# Patient Record
Sex: Female | Born: 1960 | Race: Black or African American | Hispanic: No | Smoking: Current every day smoker
Health system: Southern US, Community
[De-identification: ages and names within clinical notes are randomized; demographics above are authoritative.]

## PROBLEM LIST (undated history)

## (undated) DIAGNOSIS — K219 Gastro-esophageal reflux disease without esophagitis: Secondary | ICD-10-CM

## (undated) DIAGNOSIS — T8859XA Other complications of anesthesia, initial encounter: Secondary | ICD-10-CM

## (undated) DIAGNOSIS — M549 Dorsalgia, unspecified: Secondary | ICD-10-CM

## (undated) DIAGNOSIS — I10 Essential (primary) hypertension: Secondary | ICD-10-CM

## (undated) HISTORY — DX: Dorsalgia, unspecified: M54.9

## (undated) HISTORY — PX: KNEE SURGERY: SHX244

## (undated) HISTORY — DX: Essential (primary) hypertension: I10

---

## 2005-01-22 ENCOUNTER — Emergency Department: Payer: Self-pay | Admitting: Unknown Physician Specialty

## 2008-04-01 ENCOUNTER — Emergency Department: Payer: Self-pay | Admitting: Emergency Medicine

## 2008-06-01 ENCOUNTER — Ambulatory Visit: Payer: Self-pay

## 2009-11-29 ENCOUNTER — Ambulatory Visit: Payer: Self-pay | Admitting: Obstetrics and Gynecology

## 2009-12-01 ENCOUNTER — Ambulatory Visit: Payer: Self-pay | Admitting: Obstetrics and Gynecology

## 2010-09-04 ENCOUNTER — Emergency Department: Payer: Self-pay | Admitting: Emergency Medicine

## 2012-02-09 ENCOUNTER — Emergency Department: Payer: Self-pay | Admitting: Emergency Medicine

## 2017-05-22 ENCOUNTER — Encounter: Payer: Self-pay | Admitting: Nurse Practitioner

## 2017-05-22 ENCOUNTER — Ambulatory Visit (INDEPENDENT_AMBULATORY_CARE_PROVIDER_SITE_OTHER): Payer: 59 | Admitting: Nurse Practitioner

## 2017-05-22 VITALS — BP 146/85 | HR 71 | Temp 97.7°F | Ht 71.0 in | Wt 180.4 lb

## 2017-05-22 DIAGNOSIS — R634 Abnormal weight loss: Secondary | ICD-10-CM

## 2017-05-22 DIAGNOSIS — Z7689 Persons encountering health services in other specified circumstances: Secondary | ICD-10-CM | POA: Diagnosis not present

## 2017-05-22 DIAGNOSIS — Z1211 Encounter for screening for malignant neoplasm of colon: Secondary | ICD-10-CM

## 2017-05-22 DIAGNOSIS — I872 Venous insufficiency (chronic) (peripheral): Secondary | ICD-10-CM | POA: Diagnosis not present

## 2017-05-22 NOTE — Progress Notes (Signed)
Subjective:    Patient ID: Amber Figueroa, female    DOB: [birth year], 56 y.o.   MRN: [id number]  Donnelle Mcbroom is a 56 y.o. female presenting on 05/22/2017 for Establish Care (pt notice moderate weight loss without any changes. She was 30lbs heavier about year ago. )   HPI Establish Care New Provider Pt last seen by PCP 15 or more years ago.     Weight Loss 30 lbs in 1 year - unintentional. - Pt also started smoking again 1 year ago. Started as stress management. - Switched to night shift 3 weeks ago - no difficulty w/ sleep currently.  Works from 10-12 hours shifts, 60 hours per week.  Weight 210 lbs 1 year ago. Started losing weight when she started smoking.  Eating patterns haven't changed.   - Eats out frequently: eats [employer], [place], fast foods.  Eats full portion.   Chronic Back Pain Low back takes ibuprofen 400 mg twice daily for pain with adequate relief.   Lower leg swelling Started at "56 years old" - improves after sleep.  Has used compression socks in past.  Others non-prescription don't work well.   Has pain in legs and numbness in toes. Always pitting edema.  Past Medical History:  Diagnosis Date  . Back pain    Past Surgical History:  Procedure Laterality Date  . KNEE SURGERY     Social History   Social History  . Marital status: Married    Spouse name: N/A  . Number of children: N/A  . Years of education: N/A   Occupational History  . Not on file.   Social History Main Topics  . Smoking status: Current Every Day Smoker    Packs/day: 0.50    Years: 1.00  . Smokeless tobacco: Never Used     Comment: age [age] smoked 1/2 ppd, quit x 20 years, now smoking 1 year  . Alcohol use No  . Drug use: No  . Sexual activity: No   Other Topics Concern  . Not on file   Social History Narrative  . No narrative on file   Family History  Problem Relation Age of Onset  . Cancer Mother   . Cancer Father        unknown type  . Hypertension Sister   .  Hypertension Brother   . Hyperlipidemia Brother   . Breast cancer Maternal Grandmother        likely breast ca - mastectomy  . Hypertension Brother   . Diabetes Brother   . Hypertension Brother   . Heart attack Neg Hx   . Stroke Neg Hx   . Colon cancer Neg Hx   . Ovarian cancer Neg Hx    No current outpatient prescriptions on file prior to visit.   No current facility-administered medications on file prior to visit.     Review of Systems  Constitutional: Positive for unexpected weight change.  Cardiovascular: Positive for leg swelling.  All other systems reviewed and are negative.  Per HPI unless specifically indicated above     Objective:    BP (!) 146/85 (BP Location: Right Arm, Patient Position: Sitting, Cuff Size: Normal)   Pulse 71   Temp 97.7 F (36.5 C) (Oral)   Ht 5\' 11"  (1.803 m)   Wt 180 lb 6.4 oz (81.8 kg)   LMP 05/22/2009   SpO2 100%   BMI 25.16 kg/m   Wt Readings from Last 3 Encounters:  05/22/17 180 lb 6.4 oz (81.8 kg)  Physical Exam   General - healthy, well-appearing, NAD HEENT - Normocephalic, atraumatic, PERRL, EOMI, patent nares w/o congestion, oropharynx clear, MMM Neck - supple, non-tender, no LAD, no thyromegaly Heart - RRR, no murmurs heard Lungs - Clear throughout all lobes, no wheezing, crackles, or rhonchi. Normal work of breathing. Abdomen - soft, NTND, no masses, no hepatosplenomegaly, active bowel sounds Extremeties - non-tender, +4 pitting edema bilaterally w/ significant ankle sock-line edema, cap refill < 2 seconds, peripheral pulses intact +2 bilaterally Skin - warm, dry, no rashes Neuro - awake, alert, oriented x3, normal gait Psych - Normal mood and affect, normal behavior    Results for orders placed or performed in visit on 05/22/17  TSH  Result Value Ref Range   TSH 1.76 mIU/L      Assessment & Plan:   Problem List Items Addressed This Visit      Cardiovascular and Mediastinum   Venous insufficiency    Severe,  chronic. Possible lymphedema as well.  Pt has worn compression socks, but only has success w/ strongest pressure.  Plan: 1. Encourage compression socks. - Order written for 40-50 mmHg 2. Consider vein and vascular referral. 3. Follow up as needed.      Relevant Orders   Compression stockings    Other Visit Diagnoses    Weight loss    -  Primary Pt notes weight loss of about 30 lbs.  Multifactorial, but w/ questioning occurred after resuming smoking in last 1 year.  Possible association to edema/fluid, increased nicotine, decreased food consumption.  Cannot exclude cancer, but unlikely. Pt due for routine cancer screenings.  Plan: 1. Encouraged continued adequate nutrition. 2. Recommend cancer screenings for colon cancer, cervical cancer. 3. Evaluate TSH - normal and not cause for weight changes.   Relevant Orders   TSH (Completed)   Colon cancer screening     Pt requiring colon cancer screening.  No family history of colon cancer.  Plan: - Discussed timing for initiation of colon cancer screening ACS vs USPSTF guidelines - Mutual decision making discussion for options of colonoscopy vs cologuard.  Pt prefers colonoscopy.  - Referral to GI placed.   Relevant Orders   Ambulatory referral to Gastroenterology   Encounter to establish care     Previous PCP was more than 15 years ago.  Pt's personal past medical, family, and surgical history reviewed.  No medical records available to be obtained.         Follow up plan: Return in about 3 weeks (around 06/12/2017) for annual physical.  Wilhelmina Mcardle, DNP, AGPCNP-BC Adult Gerontology Primary Care Nurse Practitioner [facility] 05/27/2017, 9:38 AM

## 2017-05-22 NOTE — Patient Instructions (Addendum)
Amber Figueroa, Thank you for coming in to clinic today.  1. For your physical: - You will be due for FASTING BLOOD WORK (no food or drink after midnight before, only water or coffee without cream/sugar on the morning of)  - Please go ahead and schedule a "Lab Only" visit in the morning at the clinic for lab draw 2-3 days before your next Annual Physical  For Lab Results, once available within 2-3 days of blood draw, you can can log in to MyChart online to view your results and a brief explanation. Also, we can discuss results at next follow-up visit.   2. For your leg swelling:  - Start wearing compression socks.  I have given you an order for 3 pairs.   3. You will get a call from [facility] for your colonoscopy for colon cancer screening.    4. For your weight loss: - Could be only because nicotine is a stimulant and that can make you lose weight. - I am checking your thyroid today also.  If it is overactive, you could lose weight. - We need to do your cancer screenings.  Please schedule a follow-up appointment with Wilhelmina Mcardle, AGNP. Return in about 3 weeks (around 06/12/2017) for annual physical.  If you have any other questions or concerns, please feel free to call the clinic or send a message through MyChart. You may also schedule an earlier appointment if necessary.  You will receive a survey after today's visit either digitally by e-mail or paper by [employer]. Your experiences and feedback matter to [place].  Please respond so we know how we are doing as we provide care for you.   Wilhelmina Mcardle, DNP, AGNP-BC Adult Gerontology Nurse Practitioner [facility], [facility]

## 2017-05-23 LAB — TSH: TSH: 1.76 mIU/L

## 2017-05-27 DIAGNOSIS — I872 Venous insufficiency (chronic) (peripheral): Secondary | ICD-10-CM | POA: Insufficient documentation

## 2017-05-27 NOTE — Progress Notes (Signed)
I have reviewed this encounter including the documentation in this note and/or discussed this patient with the provider, Wilhelmina Mcardle, AGPCNP-BC. I am certifying that I agree with the content of this note as supervising physician.  Saralyn Pilar, DO [facility] 05/27/2017, 10:59 AM

## 2017-05-27 NOTE — Assessment & Plan Note (Signed)
Severe, chronic. Possible lymphedema as well.  Pt has worn compression socks, but only has success w/ strongest pressure.  Plan: 1. Encourage compression socks. - Order written for 40-50 mmHg 2. Consider vein and vascular referral. 3. Follow up as needed.

## 2017-06-02 ENCOUNTER — Emergency Department
Admission: EM | Admit: 2017-06-02 | Discharge: 2017-06-02 | Disposition: A | Discharge status: Home / Self Care | Payer: 59 | Attending: Emergency Medicine | Admitting: Emergency Medicine

## 2017-06-02 ENCOUNTER — Emergency Department: Payer: 59

## 2017-06-02 ENCOUNTER — Encounter: Payer: Self-pay | Admitting: Emergency Medicine

## 2017-06-02 DIAGNOSIS — F17210 Nicotine dependence, cigarettes, uncomplicated: Secondary | ICD-10-CM | POA: Diagnosis not present

## 2017-06-02 DIAGNOSIS — M1712 Unilateral primary osteoarthritis, left knee: Secondary | ICD-10-CM | POA: Diagnosis not present

## 2017-06-02 DIAGNOSIS — M25562 Pain in left knee: Secondary | ICD-10-CM | POA: Diagnosis present

## 2017-06-02 MED ORDER — MELOXICAM 15 MG PO TABS: 15.0000 mg | ORAL_TABLET | Freq: Every day | ORAL | 0 refills | Status: DC

## 2017-06-02 NOTE — ED Provider Notes (Signed)
[facility] Emergency Department Provider Note   ____________________________________________   First MD Initiated Contact with Patient 06/02/17 1533     (approximate)  I have reviewed the triage vital signs and the nursing notes.   HISTORY  Chief Complaint Knee Pain    HPI Amber Figueroa is a 56 y.o. female atient complaining of 1 week of knee pain secondary to hyperextension incident occurred at work. Patient states she stepped off a pallethyperextended her left knee. Patient stated pain increases with flexion of the knee. Patient states she is able to weight-bear. Patient rates the pain as 8/10. Patient described a pain as "throbbing/sharp". Mild relief over-the-counter anti-inflammatory medications.   Past Medical History:  Diagnosis Date  . Back pain     Patient Active Problem List   Diagnosis Date Noted  . Venous insufficiency 05/27/2017    Past Surgical History:  Procedure Laterality Date  . KNEE SURGERY      Prior to Admission medications   Medication Sig Start Date End Date Taking? Authorizing Provider  meloxicam (MOBIC) 15 MG tablet Take 1 tablet (15 mg total) by mouth daily. 06/02/17   Joni ReiningSmith, Ronald K, PA-C    Allergies Patient has no known allergies.  Family History  Problem Relation Age of Onset  . Cancer Mother   . Cancer Father        unknown type  . Hypertension Sister   . Hypertension Brother   . Hyperlipidemia Brother   . Breast cancer Maternal Grandmother        likely breast ca - mastectomy  . Hypertension Brother   . Diabetes Brother   . Hypertension Brother   . Heart attack Neg Hx   . Stroke Neg Hx   . Colon cancer Neg Hx   . Ovarian cancer Neg Hx     Social History Social History  Substance Use Topics  . Smoking status: Current Every Day Smoker    Packs/day: 0.50    Years: 1.00  . Smokeless tobacco: Never Used     Comment: age [age] smoked 1/2 ppd, quit x 20 years, now smoking 1 year  . Alcohol use  No    Review of Systems  Constitutional: No fever/chills Eyes: No visual changes. ENT: No sore throat. Cardiovascular: Denies chest pain. Respiratory: Denies shortness of breath. Gastrointestinal: No abdominal pain.  No nausea, no vomiting.  No diarrhea.  No constipation. Genitourinary: Negative for dysuria. Musculoskeletal: Negative for back pain. Skin: Negative for rash. Neurological: Negative for headaches, focal weakness or numbness.   ____________________________________________   PHYSICAL EXAM:  VITAL SIGNS: ED Triage Vitals  Enc Vitals Group     BP 06/02/17 1350 (!) 155/91     Pulse Rate 06/02/17 1350 85     Resp 06/02/17 1350 16     Temp 06/02/17 1350 98.6 F (37 C)     Temp Source 06/02/17 1350 Oral     SpO2 06/02/17 1350 96 %     Weight 06/02/17 1345 106 lb (48.1 kg)     Height 06/02/17 1345 5\' 11"  (1.803 m)     Head Circumference --      Peak Flow --      Pain Score 06/02/17 1345 0     Pain Loc --      Pain Edu? --      Excl. in GC? --     Constitutional: Alert and oriented. Well appearing and in no acute distress. Cardiovascular: Normal rate, regular rhythm. Grossly normal  heart sounds.  Good peripheral circulation. Respiratory: Normal respiratory effort.  No retractions. Lungs CTAB. Musculoskeletal: no obvious deformity to the left knee. Patient has mild crepitus to palpation. Patient decreased range of motion limited by complaining of pain with flexion. Patient is able to bear weight. Neurologic:  Normal speech and language. No gross focal neurologic deficits are appreciated. No gait instability. Skin:  Skin is warm, dry and intact. No rash noted. Psychiatric: Mood and affect are normal. Speech and behavior are normal.  ____________________________________________   LABS (all labs ordered are listed, but only abnormal results are displayed)  Labs Reviewed - No data to  display ____________________________________________  EKG   ____________________________________________  RADIOLOGY  Dg Knee Complete 4 Views Left  Result Date: 06/02/2017 CLINICAL DATA:  Hyperextension injury 1 week ago with persistent pain. EXAM: LEFT KNEE - COMPLETE 4+ VIEW COMPARISON:  None. FINDINGS: No evidence of fracture, dislocation, or joint effusion. Tibial spine osteophytosis and decreased femoral tibial joint space are noted. Soft tissues are unremarkable. IMPRESSION: No acute fracture or dislocation. Degenerative joint changes of left knee. Electronically Signed   By: Sherian Rein M.D.   On: 06/02/2017 16:14    X-ray findings consistent with osteoarthritis of the left knee.____________________________________________   PROCEDURES  Procedure(s) performed: None  Procedures  Critical Care performed: No  ____________________________________________   INITIAL IMPRESSION / ASSESSMENT AND PLAN / ED COURSE  Pertinent labs & imaging results that were available during my care of the patient were reviewed by me and considered in my medical decision making (see chart for details).  Patient Rocephin knee pain increased over the last week secondary to hyperextension injury. Discussed x-ray findings consistent with joint space narrowing of the left lower extremity.patient given discharge care instructions and advised follow-up with PCP for continual care.      ____________________________________________   FINAL CLINICAL IMPRESSION(S) / ED DIAGNOSES  Final diagnoses:  Primary osteoarthritis of left knee      NEW MEDICATIONS STARTED DURING THIS VISIT:  New Prescriptions   MELOXICAM (MOBIC) 15 MG TABLET    Take 1 tablet (15 mg total) by mouth daily.     Note:  This document was prepared using Dragon voice recognition software and may include unintentional dictation errors.    Joni Reining, PA-C 06/02/17 1655    Sharman Cheek, MD 06/09/17 [phone]

## 2017-06-02 NOTE — ED Triage Notes (Signed)
Patient presents to the ED with left knee pain x 1 week after stepping on a palette at work.  Patient works at [employer].  Patient is in no obvious distress at this time, reports pain is only when she bends her knee.

## 2017-06-02 NOTE — Discharge Instructions (Signed)
Advised to wear elastic knee support wire at work.

## 2017-06-02 NOTE — ED Notes (Signed)
Pt reports left knee pain since last Tuesday - pt reports that she stepped wrong on a pallet at work and twisted her knee - no redness noted - left knee is swollen

## 2017-06-05 ENCOUNTER — Ambulatory Visit (INDEPENDENT_AMBULATORY_CARE_PROVIDER_SITE_OTHER): Payer: 59 | Admitting: Nurse Practitioner

## 2017-06-05 ENCOUNTER — Encounter: Payer: Self-pay | Admitting: Nurse Practitioner

## 2017-06-05 VITALS — BP 140/72 | HR 77 | Temp 97.7°F | Ht 71.0 in | Wt 185.2 lb

## 2017-06-05 DIAGNOSIS — Z780 Asymptomatic menopausal state: Secondary | ICD-10-CM | POA: Diagnosis not present

## 2017-06-05 DIAGNOSIS — Z1231 Encounter for screening mammogram for malignant neoplasm of breast: Secondary | ICD-10-CM

## 2017-06-05 DIAGNOSIS — Z1211 Encounter for screening for malignant neoplasm of colon: Secondary | ICD-10-CM

## 2017-06-05 DIAGNOSIS — Z13820 Encounter for screening for osteoporosis: Secondary | ICD-10-CM

## 2017-06-05 DIAGNOSIS — Z23 Encounter for immunization: Secondary | ICD-10-CM

## 2017-06-05 DIAGNOSIS — Z124 Encounter for screening for malignant neoplasm of cervix: Secondary | ICD-10-CM

## 2017-06-05 DIAGNOSIS — Z0000 Encounter for general adult medical examination without abnormal findings: Secondary | ICD-10-CM

## 2017-06-05 DIAGNOSIS — Z1159 Encounter for screening for other viral diseases: Secondary | ICD-10-CM

## 2017-06-05 DIAGNOSIS — Z1239 Encounter for other screening for malignant neoplasm of breast: Secondary | ICD-10-CM

## 2017-06-05 DIAGNOSIS — Z114 Encounter for screening for human immunodeficiency virus [HIV]: Secondary | ICD-10-CM

## 2017-06-05 NOTE — Patient Instructions (Addendum)
Amber Figueroa, Thank you for coming in to clinic today.  1. No new findings on your annual physical today. - Let me know if you would like a shingles vaccine. Call your insurance company to ask if we can administer it here or if you should go to your pharmacy. - You have received your tetanus vaccine today. - We also recommend a flu vaccine.  If you decide you would like one, call the clinic.  2. For your Colon cancer screening: [facility] will call you to schedule your appointment.  3. For your breast cancer screening and bone density scan: Your orders have been placed.  Call the Scheduling phone number at [phone] to schedule your tests at your convenience.  You can choose to go to either location listed below.  Let the scheduler know which location you prefer.  [facility] [address] [place], [place] [address] [place], [place] [address]    4. For your labs: You will be due for FASTING BLOOD WORK (no food or drink after midnight before, only water or coffee without cream/sugar on the morning of)  - Please go ahead and schedule a "Lab Only" visit in the morning at the clinic for lab draw in the next 7 days  For Lab Results, once available within 2-3 days of blood draw, you can can log in to MyChart online to view your results and a brief explanation. Also, we can discuss results at next follow-up visit.   Please schedule a follow-up appointment with Wilhelmina Mcardle, AGNP. Return in about 1 year (around 06/05/2018) for annual physical.  If you have any other questions or concerns, please feel free to call the clinic or send a message through MyChart. You may also schedule an earlier appointment if necessary.  You will receive a survey after today's visit either digitally by e-mail or paper by [employer]. Your experiences and feedback matter to [place].  Please respond so we  know how we are doing as we provide care for you.   Wilhelmina Mcardle, DNP, AGNP-BC Adult Gerontology Nurse Practitioner [facility], [facility]   Bone Densitometry Bone densitometry is an imaging test that uses a special X-ray to measure the amount of calcium and other minerals in your bones (bone density). This test is also known as a bone mineral density test or dual-energy X-ray absorptiometry (DXA). The test can measure bone density at your hip and your spine. It is similar to having a regular X-ray. You may have this test to:  Diagnose a condition that causes weak or thin bones (osteoporosis).  Predict your risk of a broken bone (fracture).  Determine how well osteoporosis treatment is working.  Tell a health care provider about:  Any allergies you have.  All medicines you are taking, including vitamins, herbs, eye drops, creams, and over-the-counter medicines.  Any problems you or family members have had with anesthetic medicines.  Any blood disorders you have.  Any surgeries you have had.  Any medical conditions you have.  Possibility of pregnancy.  Any other medical test you had within the previous 14 days that used contrast material. What are the risks? Generally, this is a safe procedure. However, problems can occur and may include the following:  This test exposes you to a very small amount of radiation.  The risks of radiation exposure may be greater to unborn children.  What happens before the procedure?  Do not take any calcium supplements for 24 hours before having the test. You can otherwise eat and drink what you usually do.  Take off all metal jewelry, eyeglasses, dental appliances, and any other metal objects. What happens during the procedure?  You may lie on an exam table. There will be an X-ray generator below you and an imaging device above you.  Other devices, such as boxes or braces, may be used to position your body properly for the  scan.  You will need to lie still while the machine slowly scans your body.  The images will show up on a computer monitor. What happens after the procedure? You may need more testing at a later time. This information is not intended to replace advice given to you by your health care provider. Make sure you discuss any questions you have with your health care provider. Document Released: 10/08/2004 Document Revised: 02/22/2016 Document Reviewed: 02/24/2014 Elsevier Interactive Patient Education  2018 [employer]

## 2017-06-05 NOTE — Progress Notes (Signed)
Subjective:    Patient ID: Amber Figueroa, female    DOB: 11-Jul-1961, 56 y.o.   MRN: [id number]  Amber Figueroa is a 56 y.o. female presenting on 06/05/2017 for Annual Exam   HPI  Annual Physical Exam Patient has been feeling well.  They have no acute concerns today. Left knee pain is improving.  Sleeps 3-4 hours per day uninterrupted.  Sleeps at night when not working and gets more sleep.  60 hour workweek 10-12 hour shifts from 6p-6a or longer. Compression socks are working well.  HEALTH MAINTENANCE: Weight/BMI: Gained 5 lbs in 1 month, yet still complains she has been losing weight.  Reassured pt. Physical activity: none outside of work Diet: no changes, fast food, some vegetables, rarely prepares food from home Seatbelt: always Sunscreen: no PAP: due today Mammogram: not in last 2 years. DEXA: never  Colon Cancer Screen: colonoscopy preferred HIV/HEP C: due today Optometry: yes about every 1-2 years wears glasses Dentistry: No. Preferred dentist is too far away.  VACCINES: Tetanus: due - pt agrees today Influenza: Declines today Shingles: Declines today   Past Medical History:  Diagnosis Date  . Back pain    Past Surgical History:  Procedure Laterality Date  . KNEE SURGERY     Social History   Social History  . Marital status: Married    Spouse name: N/A  . Number of children: N/A  . Years of education: N/A   Occupational History  . Not on file.   Social History Main Topics  . Smoking status: Current Every Day Smoker    Packs/day: 0.50    Years: 1.00  . Smokeless tobacco: Never Used     Comment: age [age] smoked 1/2 ppd, quit x 20 years, now smoking 1 year  . Alcohol use No  . Drug use: No  . Sexual activity: No   Other Topics Concern  . Not on file   Social History Narrative  . No narrative on file   Family History  Problem Relation Age of Onset  . Cancer Mother   . Cancer Father        unknown type  . Hypertension Sister   . Hypertension  Brother   . Hyperlipidemia Brother   . Breast cancer Maternal Grandmother        likely breast ca - mastectomy  . Hypertension Brother   . Diabetes Brother   . Hypertension Brother   . Heart attack Neg Hx   . Stroke Neg Hx   . Colon cancer Neg Hx   . Ovarian cancer Neg Hx    Current Outpatient Prescriptions on File Prior to Visit  Medication Sig  . meloxicam (MOBIC) 15 MG tablet Take 1 tablet (15 mg total) by mouth daily.   No current facility-administered medications on file prior to visit.     Review of Systems Per HPI unless specifically indicated above  No changes since last visit.     Objective:    BP 140/72 (BP Location: Right Arm, Patient Position: Sitting, Cuff Size: Large)   Pulse 77   Temp 97.7 F (36.5 C) (Oral)   Ht 5\' 11"  (1.803 m)   Wt 185 lb 3.2 oz (84 kg)   SpO2 100%   BMI 25.83 kg/m   Wt Readings from Last 3 Encounters:  06/05/17 185 lb 3.2 oz (84 kg)  06/02/17 106 lb (48.1 kg)  05/22/17 180 lb 6.4 oz (81.8 kg)    Physical Exam  General - healthy, fatigued ,  NAD HEENT - Normocephalic, atraumatic, PERRL, EOMI, patent nares w/o congestion, oropharynx clear, MMM Neck - supple, non-tender, no LAD, no thyromegaly, no carotid bruit Heart - RRR, no murmurs heard Lungs - Clear throughout all lobes, no wheezing, crackles, or rhonchi. Normal work of breathing. Breast - Normal exam w/ symmetric breasts, no mass, no nipple discharge, no skin changes or tenderness.  Abdomen - soft, NTND, no masses, no hepatosplenomegaly, active bowel sounds GU - Normal external female genitalia without lesions or fusion. Vaginal canal without lesions. Normal appearing cervix without lesions or friability. Physiologic discharge on exam. Bimanual exam without adnexal masses, enlarged uterus, or cervical motion tenderness. Extremeties - non-tender, +4 pitting edema bilaterally (left leg w/ knee-high compression sock improved over last visit), cap refill < 2 seconds, peripheral  pulses intact +2 bilaterally Skin - warm, dry, no rashes Neuro - awake, alert, oriented x3, CN II-X intact, intact muscle strength 5/5 bilaterally, intact distal sensation to light touch, normal coordination, normal gait Psych - Normal mood and affect, withdrawn behavior (pt cites fatigue)   Results for orders placed or performed in visit on 05/22/17  TSH  Result Value Ref Range   TSH 1.76 mIU/L      Assessment & Plan:   Problem List Items Addressed This Visit    None    Visit Diagnoses    Annual physical exam    -  Primary Physical exam with no new findings.  Well adult with no acute concerns.  Plan: 1. Obtain health maintenance screenings. 2. Return 1 year for annual physical.   Relevant Orders   Hepatitis C antibody   HIV antibody   CBC with Differential/Platelet   Comprehensive metabolic panel   Hemoglobin A1c   Lipid panel   VITAMIN D 25 Hydroxy (Vit-D Deficiency, Fractures)   PAP, Thin Prep w/HPV rflx HPV Type 16/18   MM DIGITAL SCREENING BILATERAL   DG Bone Density   Ambulatory referral to Gastroenterology   Asymptomatic postmenopausal estrogen deficiency       Osteoporosis screening     Pt postmenopausal w/o history of prior DEXA scan.    Plan: 1. Obtain DG bone density.     Relevant Orders   DG Bone Density   Colon cancer screening     Pt requiring colon cancer screening for initial screening.  No known family history of colon cancer.  Plan: - Discussed timing for initiation of colon cancer screening ACS vs USPSTF guidelines - Mutual decision making discussion for options of colonoscopy vs cologuard.  Pt prefers colonoscopy. - Referral to GI placed.   Relevant Orders   Ambulatory referral to Gastroenterology   Breast cancer screening     Pt w/o prior mammogram.  Plan: 1. Screening mammogram order placed.  Pt will call to schedule appointment.  Information given.    Relevant Orders   MM DIGITAL SCREENING BILATERAL   Encounter for vaccination       Pt agrees to tetanus vaccine today. Declines influenza.  Administer Tdap.   Relevant Orders   Tdap vaccine greater than or equal to 7yo IM (Completed)   Encounter for hepatitis C screening test for low risk patient     Low risk by behavioral assessment.  Pt in age range w/ increased risk.  Plan: 1. Screen Hep C   Relevant Orders   Hepatitis C antibody   Screening for HIV without presence of risk factors     Low risk by behavioral assessment.  No prior screening.  Plan: 1.  Screen HIV   Relevant Orders   HIV antibody   Cervical cancer screening     Pt w/o recent pap smear.  Last pap tests have been normal.  Plan: 1. PAP w/ HPV today.  If normal and negative HPV, will screen again in 5 years.   Relevant Orders   PAP, Thin Prep w/HPV rflx HPV Type 16/18       Follow up plan: Return in about 1 year (around 06/05/2018) for annual physical.  Wilhelmina Mcardle, DNP, AGPCNP-BC Adult Gerontology Primary Care Nurse Practitioner [facility] 06/05/2017, 3:35 PM

## 2017-06-09 ENCOUNTER — Other Ambulatory Visit: Payer: 59

## 2017-06-09 ENCOUNTER — Other Ambulatory Visit: Payer: Self-pay | Admitting: Nurse Practitioner

## 2017-06-09 DIAGNOSIS — Z1159 Encounter for screening for other viral diseases: Secondary | ICD-10-CM

## 2017-06-09 DIAGNOSIS — E559 Vitamin D deficiency, unspecified: Secondary | ICD-10-CM

## 2017-06-09 DIAGNOSIS — Z0000 Encounter for general adult medical examination without abnormal findings: Secondary | ICD-10-CM

## 2017-06-09 DIAGNOSIS — Z114 Encounter for screening for human immunodeficiency virus [HIV]: Secondary | ICD-10-CM

## 2017-06-10 LAB — COMPLETE METABOLIC PANEL WITH GFR
AG Ratio: 1.5 (calc) (ref 1.0–2.5)
ALT: 18 U/L (ref 6–29)
AST: 15 U/L (ref 10–35)
Albumin: 3.8 g/dL (ref 3.6–5.1)
Alkaline phosphatase (APISO): 66 U/L (ref 33–130)
BUN: 18 mg/dL (ref 7–25)
CO2: 30 mmol/L (ref 20–32)
Calcium: 9.1 mg/dL (ref 8.6–10.4)
Chloride: 105 mmol/L (ref 98–110)
Creat: 0.75 mg/dL (ref 0.50–1.05)
GFR, Est African American: 103 mL/min/{1.73_m2} (ref >=60)
GFR, Est Non African American: 89 mL/min/{1.73_m2} (ref >=60)
Globulin: 2.5 g/dL (calc) (ref 1.9–3.7)
Glucose, Bld: 91 mg/dL (ref 65–99)
Potassium: 3.8 mmol/L (ref 3.5–5.3)
Sodium: 142 mmol/L (ref 135–146)
Total Bilirubin: 0.4 mg/dL (ref 0.2–1.2)
Total Protein: 6.3 g/dL (ref 6.1–8.1)

## 2017-06-10 LAB — CBC WITH DIFFERENTIAL/PLATELET
Basophils Absolute: 41 cells/uL (ref 0–200)
Basophils Relative: 1 %
Eosinophils Absolute: 291 cells/uL (ref 15–500)
Eosinophils Relative: 7.1 %
HCT: 38.3 % (ref 35.0–45.0)
Hemoglobin: 12.9 g/dL (ref 11.7–15.5)
Lymphs Abs: 1410 cells/uL (ref 850–3900)
MCH: 28.4 pg (ref 27.0–33.0)
MCHC: 33.7 g/dL (ref 32.0–36.0)
MCV: 84.4 fL (ref 80.0–100.0)
MPV: 12.7 fL — ABNORMAL HIGH (ref 7.5–12.5)
Monocytes Relative: 8.5 %
Neutro Abs: 2009 cells/uL (ref 1500–7800)
Neutrophils Relative %: 49 %
Platelets: 266 10*3/uL (ref 140–400)
RBC: 4.54 10*6/uL (ref 3.80–5.10)
RDW: 14.3 % (ref 11.0–15.0)
Total Lymphocyte: 34.4 %
WBC mixed population: 349 cells/uL (ref 200–950)
WBC: 4.1 10*3/uL (ref 3.8–10.8)

## 2017-06-10 LAB — HEMOGLOBIN A1C
Hgb A1c MFr Bld: 5 % of total Hgb (ref <5.7)
Mean Plasma Glucose: 97 (calc)
eAG (mmol/L): 5.4 (calc)

## 2017-06-10 LAB — LIPID PANEL
Cholesterol: 169 mg/dL (ref <200)
HDL: 63 mg/dL (ref >50)
LDL Cholesterol (Calc): 93 mg/dL (calc)
Non-HDL Cholesterol (Calc): 106 mg/dL (calc) (ref <130)
Total CHOL/HDL Ratio: 2.7 (calc) (ref <5.0)
Triglycerides: 52 mg/dL (ref <150)

## 2017-06-10 LAB — HIV ANTIBODY (ROUTINE TESTING W REFLEX): HIV 1&2 Ab, 4th Generation: NONREACTIVE

## 2017-06-10 LAB — VITAMIN D 25 HYDROXY (VIT D DEFICIENCY, FRACTURES): Vit D, 25-Hydroxy: 15 ng/mL — ABNORMAL LOW (ref 30–100)

## 2017-06-10 LAB — HEPATITIS C ANTIBODY
Hepatitis C Ab: NONREACTIVE
SIGNAL TO CUT-OFF: 0.01 (ref <1.00)

## 2017-06-11 MED ORDER — VITAMIN D (ERGOCALCIFEROL) 1.25 MG (50000 UNIT) PO CAPS: 50000.0000 [IU] | ORAL_CAPSULE | ORAL | 0 refills | Status: DC

## 2017-06-12 LAB — PAP, TP IMAGING W/ HPV RNA, RFLX HPV TYPE 16,18/45: HPV DNA High Risk: NOT DETECTED

## 2017-06-23 ENCOUNTER — Ambulatory Visit
Admission: RE | Admit: 2017-06-23 | Discharge: 2017-06-23 | Disposition: A | Discharge status: Home / Self Care | Payer: 59 | Source: Ambulatory Visit | Attending: Nurse Practitioner | Admitting: Nurse Practitioner

## 2017-06-23 DIAGNOSIS — Z0000 Encounter for general adult medical examination without abnormal findings: Secondary | ICD-10-CM

## 2017-06-23 DIAGNOSIS — Z1231 Encounter for screening mammogram for malignant neoplasm of breast: Secondary | ICD-10-CM | POA: Diagnosis present

## 2017-06-23 DIAGNOSIS — Z1239 Encounter for other screening for malignant neoplasm of breast: Secondary | ICD-10-CM

## 2017-07-04 ENCOUNTER — Telehealth: Payer: Self-pay

## 2017-07-04 ENCOUNTER — Other Ambulatory Visit: Payer: Self-pay

## 2017-07-04 DIAGNOSIS — Z1211 Encounter for screening for malignant neoplasm of colon: Secondary | ICD-10-CM

## 2017-07-04 NOTE — Telephone Encounter (Signed)
Gastroenterology Pre-Procedure Review  Request Date:  Requesting Physician: Dr.   PATIENT REVIEW QUESTIONS: The patient responded to the following health history questions as indicated:    1. Are you having any GI issues? no 2. Do you have a personal history of Polyps? no 3. Do you have a family history of Colon Cancer or Polyps? no 4. Diabetes Mellitus? no 5. Joint replacements in the past 12 months?no 6. Major health problems in the past 3 months?no 7. Any artificial heart valves, MVP, or defibrillator?no    MEDICATIONS & ALLERGIES:    Patient reports the following regarding taking any anticoagulation/antiplatelet therapy:   Plavix, Coumadin, Eliquis, Xarelto, Lovenox, Pradaxa, Brilinta, or Effient? no Aspirin? no  Patient confirms/reports the following medications:  Current Outpatient Prescriptions  Medication Sig Dispense Refill  . meloxicam (MOBIC) 15 MG tablet Take 1 tablet (15 mg total) by mouth daily. 30 tablet 0  . Vitamin D, Ergocalciferol, (DRISDOL) 50000 units CAPS capsule Take 1 capsule (50,000 Units total) by mouth every 7 (seven) days. 8 capsule 0   No current facility-administered medications for this visit.     Patient confirms/reports the following allergies:  No Known Allergies  No orders of the defined types were placed in this encounter.   AUTHORIZATION INFORMATION Primary Insurance: 1D#: Group #:  Secondary Insurance: 1D#: Group #:  SCHEDULE INFORMATION: Date: 07/25/17 Time: Location: [facility]

## 2017-07-08 ENCOUNTER — Telehealth: Payer: Self-pay

## 2017-07-08 NOTE — Telephone Encounter (Signed)
Patient concerned about receiving prep documents.  Advised Ginger mailed out on 10/5. If not received by 10/10 we'll print them out and someone can stop by and pick them up for her.

## 2017-07-21 ENCOUNTER — Encounter: Payer: Self-pay | Admitting: *Deleted

## 2017-07-21 ENCOUNTER — Ambulatory Visit: Payer: 59 | Admitting: Anesthesiology

## 2017-07-21 ENCOUNTER — Ambulatory Visit
Admission: RE | Admit: 2017-07-21 | Discharge: 2017-07-21 | Disposition: A | Discharge status: Home / Self Care | Payer: 59 | Source: Ambulatory Visit | Attending: Gastroenterology | Admitting: Gastroenterology

## 2017-07-21 ENCOUNTER — Encounter
Admission: RE | Disposition: A | Discharge status: Home / Self Care | Payer: Self-pay | Source: Ambulatory Visit | Attending: Gastroenterology

## 2017-07-21 DIAGNOSIS — K621 Rectal polyp: Secondary | ICD-10-CM | POA: Insufficient documentation

## 2017-07-21 DIAGNOSIS — Z1211 Encounter for screening for malignant neoplasm of colon: Secondary | ICD-10-CM | POA: Insufficient documentation

## 2017-07-21 DIAGNOSIS — F17210 Nicotine dependence, cigarettes, uncomplicated: Secondary | ICD-10-CM | POA: Diagnosis not present

## 2017-07-21 HISTORY — PX: COLONOSCOPY WITH PROPOFOL: SHX5780

## 2017-07-21 SURGERY — COLONOSCOPY WITH PROPOFOL
Anesthesia: General

## 2017-07-21 MED ORDER — LIDOCAINE HCL (CARDIAC) 20 MG/ML IV SOLN
INTRAVENOUS | Status: DC | PRN
  Administered 2017-07-21: 100 mg via INTRAVENOUS

## 2017-07-21 MED ORDER — LIDOCAINE HCL (PF) 2 % IJ SOLN
INTRAMUSCULAR | Status: AC
  Filled 2017-07-21: qty 10

## 2017-07-21 MED ORDER — SODIUM CHLORIDE 0.9 % IV SOLN
INTRAVENOUS | Status: DC
  Administered 2017-07-21: 10:00:00 via INTRAVENOUS

## 2017-07-21 MED ORDER — PROPOFOL 500 MG/50ML IV EMUL
INTRAVENOUS | Status: AC
  Filled 2017-07-21: qty 50

## 2017-07-21 MED ORDER — PROPOFOL 10 MG/ML IV BOLUS
INTRAVENOUS | Status: AC
  Filled 2017-07-21: qty 20

## 2017-07-21 MED ORDER — PROPOFOL 500 MG/50ML IV EMUL
INTRAVENOUS | Status: DC | PRN
  Administered 2017-07-21: 150 ug/kg/min via INTRAVENOUS

## 2017-07-21 MED ORDER — PROPOFOL 10 MG/ML IV BOLUS
INTRAVENOUS | Status: DC | PRN
  Administered 2017-07-21: 50 mg via INTRAVENOUS
  Administered 2017-07-21: 30 mg via INTRAVENOUS

## 2017-07-21 NOTE — Transfer of Care (Signed)
Immediate Anesthesia Transfer of Care Note  Patient: Amber Figueroa  Procedure(s) Performed: COLONOSCOPY WITH PROPOFOL (N/A )  Patient Location: PACU  Anesthesia Type:General  Level of Consciousness: drowsy and patient cooperative  Airway & Oxygen Therapy: Patient Spontanous Breathing and Patient connected to nasal cannula oxygen  Post-op Assessment: Report given to RN and Post -op Vital signs reviewed and stable  Post vital signs: Reviewed and stable  Last Vitals:  Vitals:   07/21/17 0945 07/21/17 1041  BP: 121/87 116/72  Pulse: 88 82  Resp: 16 19  Temp: (!) 36.1 C (!) 36.4 C  SpO2: 100% 100%    Last Pain:  Vitals:   07/21/17 1041  TempSrc: Tympanic         Complications: No apparent anesthesia complications

## 2017-07-21 NOTE — Anesthesia Post-op Follow-up Note (Signed)
Anesthesia QCDR form completed.        

## 2017-07-21 NOTE — Op Note (Addendum)
[facility] Gastroenterology Patient Name: Jezreel Sisk Procedure Date: 07/21/2017 10:08 AM MRN: [id number] Account #: [id number] Date of Birth: 19-Mar-1961 Admit Type: Outpatient Age: 56 Room: [facility] ENDO ROOM 4 Gender: Female Note Status: Finalized Procedure:            Colonoscopy Indications:          Screening for colorectal malignant neoplasm Providers:            Wyline Mood MD, MD Referring MD:         No Local Md, MD (Referring MD) Medicines:            Monitored Anesthesia Care Complications:        No immediate complications. Procedure:            Pre-Anesthesia Assessment:                       - ASA Grade Assessment: III - A patient with severe                        systemic disease.                       - Prior to the procedure, a History and Physical was                        performed, and patient medications, allergies and                        sensitivities were reviewed. The patient's tolerance of                        previous anesthesia was reviewed.                       - The risks and benefits of the procedure and the                        sedation options and risks were discussed with the                        patient. All questions were answered and informed                        consent was obtained.                       After obtaining informed consent, the colonoscope was                        passed under direct vision. Throughout the procedure,                        the patient's blood pressure, pulse, and oxygen                        saturations were monitored continuously. The                        Colonoscope was introduced through the anus and  advanced to the the cecum, identified by the                        appendiceal orifice. The colonoscopy was performed with                        ease. The patient tolerated the procedure well. The                        quality of the bowel preparation  was good. Findings:      The perianal and digital rectal examinations were normal.      A 3 mm polyp was found in the rectum. The polyp was sessile. The polyp       was removed with a cold biopsy forceps. Resection and retrieval were       complete.      The exam was otherwise without abnormality on direct and retroflexion       views. Impression:           - One 3 mm polyp in the rectum.                       - The examination was otherwise normal on direct and                        retroflexion views.                       - No specimens collected. Recommendation:       - Discharge patient to home (with escort).                       - Resume previous diet.                       - Continue present medications.                       - Await pathology results.                       - Repeat colonoscopy in 5-10 years for surveillance                        based on pathology results. Procedure Code(s):    --- Professional ---                       [phone], Colonoscopy, flexible; with biopsy, single or                        multiple Diagnosis Code(s):    --- Professional ---                       Z12.11, Encounter for screening for malignant neoplasm                        of colon                       K62.1, Rectal polyp CPT copyright 2016 American Medical Association. All rights reserved. The codes documented in this report are preliminary and upon coder review may  be revised to meet current compliance requirements. Attending Participation:      I personally performed the entire procedure. Wyline MoodKiran Shylin Keizer, MD Wyline MoodKiran Derren Suydam MD, MD 07/21/2017 10:38:38 AM This report has been signed electronically. Number of Addenda: 0 Note Initiated On: 07/21/2017 10:08 AM Scope Withdrawal Time: 0 hours 14 minutes 35 seconds  Total Procedure Duration: 0 hours 23 minutes 16 seconds       [facility]

## 2017-07-21 NOTE — Anesthesia Postprocedure Evaluation (Signed)
Anesthesia Post Note  Patient: Cory Roughenrene Sayed  Procedure(s) Performed: COLONOSCOPY WITH PROPOFOL (N/A )  Patient location during evaluation: Endoscopy Anesthesia Type: General Level of consciousness: awake and alert and oriented Pain management: pain level controlled Vital Signs Assessment: post-procedure vital signs reviewed and stable Respiratory status: spontaneous breathing, nonlabored ventilation and respiratory function stable Cardiovascular status: blood pressure returned to baseline and stable Postop Assessment: no signs of nausea or vomiting Anesthetic complications: no     Last Vitals:  Vitals:   07/21/17 1041 07/21/17 1051  BP: 116/72 139/85  Pulse: 82 78  Resp: 19 13  Temp: (!) 36.4 C   SpO2: 100% 100%    Last Pain:  Vitals:   07/21/17 1041  TempSrc: Tympanic                 Philemon Riedesel

## 2017-07-21 NOTE — Anesthesia Preprocedure Evaluation (Signed)
Anesthesia Evaluation  Patient identified by MRN, date of birth, ID band Patient awake    Reviewed: Allergy & Precautions, NPO status , Patient's Chart, lab work & pertinent test results  History of Anesthesia Complications Negative for: history of anesthetic complications  Airway Mallampati: III  TM Distance: >3 FB Neck ROM: Full    Dental  (+) Poor Dentition   Pulmonary neg sleep apnea, neg COPD, Current Smoker,    breath sounds clear to auscultation- rhonchi (-) wheezing      Cardiovascular Exercise Tolerance: Good (-) hypertension(-) CAD, (-) Past MI and (-) Cardiac Stents  Rhythm:Regular Rate:Normal - Systolic murmurs and - Diastolic murmurs    Neuro/Psych negative neurological ROS  negative psych ROS   GI/Hepatic negative GI ROS, Neg liver ROS,   Endo/Other  negative endocrine ROSneg diabetes  Renal/GU negative Renal ROS     Musculoskeletal negative musculoskeletal ROS (+)   Abdominal (+) - obese,   Peds  Hematology negative hematology ROS (+)   Anesthesia Other Findings Past Medical History: No date: Back pain   Reproductive/Obstetrics                             Anesthesia Physical Anesthesia Plan  ASA: II  Anesthesia Plan: General   Post-op Pain Management:    Induction: Intravenous  PONV Risk Score and Plan: 1 and Propofol infusion  Airway Management Planned: Natural Airway  Additional Equipment:   Intra-op Plan:   Post-operative Plan:   Informed Consent: I have reviewed the patients History and Physical, chart, labs and discussed the procedure including the risks, benefits and alternatives for the proposed anesthesia with the patient or authorized representative who has indicated his/her understanding and acceptance.   Dental advisory given  Plan Discussed with: CRNA and Anesthesiologist  Anesthesia Plan Comments:         Anesthesia Quick  Evaluation

## 2017-07-21 NOTE — H&P (Signed)
  Wyline MoodKiran Harrington Jobe MD [address] [place], [place] [address] Phone: [phone] Fax : [phone]  Primary Care Physician:  Galen ManilaKennedy, Lauren Renee, NP Primary Gastroenterologist:  Dr. Wyline MoodKiran Avante Carneiro   Pre-Procedure History & Physical: HPI:  Amber Figueroa is a 56 y.o. female is here for an colonoscopy.   Past Medical History:  Diagnosis Date  . Back pain     Past Surgical History:  Procedure Laterality Date  . KNEE SURGERY      Prior to Admission medications   Not on File    Allergies as of 07/04/2017  . (No Known Allergies)    Family History  Problem Relation Age of Onset  . Cancer Mother   . Cancer Father        unknown type  . Hypertension Sister   . Hypertension Brother   . Hyperlipidemia Brother   . Breast cancer Maternal Grandmother        likely breast ca - mastectomy  . Hypertension Brother   . Diabetes Brother   . Hypertension Brother   . Heart attack Neg Hx   . Stroke Neg Hx   . Colon cancer Neg Hx   . Ovarian cancer Neg Hx     Social History   Social History  . Marital status: Married    Spouse name: N/A  . Number of children: N/A  . Years of education: N/A   Occupational History  . Not on file.   Social History Main Topics  . Smoking status: Current Every Day Smoker    Packs/day: 0.50    Years: 1.00  . Smokeless tobacco: Never Used     Comment: age [age] smoked 1/2 ppd, quit x 20 years, now smoking 1 year  . Alcohol use No  . Drug use: No  . Sexual activity: No   Other Topics Concern  . Not on file   Social History Narrative  . No narrative on file    Review of Systems: See HPI, otherwise negative ROS  Physical Exam: BP 121/87   Pulse 88   Temp (!) 97 F (36.1 C) (Tympanic)   Resp 16   Ht 5\' 11"  (1.803 m)   Wt 185 lb (83.9 kg)   SpO2 100%   BMI 25.80 kg/m  General:   Alert,  pleasant and cooperative in NAD Head:  Normocephalic and atraumatic. Neck:  Supple; no masses or thyromegaly. Lungs:  Clear throughout to  auscultation.    Heart:  Regular rate and rhythm. Abdomen:  Soft, nontender and nondistended. Normal bowel sounds, without guarding, and without rebound.   Neurologic:  Alert and  oriented x4;  grossly normal neurologically.  Impression/Plan: Amber Figueroa is here for an colonoscopy to be performed for Screening colonoscopy average risk    Risks, benefits, limitations, and alternatives regarding  colonoscopy have been reviewed with the patient.  Questions have been answered.  All parties agreeable.   Wyline MoodKiran Remmy Crass, MD  07/21/2017, 9:59 AM

## 2017-07-21 NOTE — Anesthesia Procedure Notes (Signed)
Date/Time: 07/21/2017 10:08 AM Performed by: Marlana SalvageJESSUP, Serita Degroote Pre-anesthesia Checklist: Patient identified, Emergency Drugs available, Suction available, Patient being monitored and Timeout performed Patient Re-evaluated:Patient Re-evaluated prior to induction Oxygen Delivery Method: Nasal cannula Placement Confirmation: positive ETCO2

## 2017-07-22 ENCOUNTER — Encounter: Payer: Self-pay | Admitting: Gastroenterology

## 2017-07-22 LAB — SURGICAL PATHOLOGY

## 2017-07-28 ENCOUNTER — Telehealth: Payer: Self-pay

## 2017-07-28 ENCOUNTER — Ambulatory Visit
Admission: RE | Admit: 2017-07-28 | Discharge: 2017-07-28 | Disposition: A | Discharge status: Home / Self Care | Payer: 59 | Source: Ambulatory Visit | Attending: Nurse Practitioner | Admitting: Nurse Practitioner

## 2017-07-28 DIAGNOSIS — F17200 Nicotine dependence, unspecified, uncomplicated: Secondary | ICD-10-CM | POA: Diagnosis not present

## 2017-07-28 DIAGNOSIS — Z780 Asymptomatic menopausal state: Secondary | ICD-10-CM | POA: Diagnosis not present

## 2017-07-28 DIAGNOSIS — Z0000 Encounter for general adult medical examination without abnormal findings: Secondary | ICD-10-CM | POA: Diagnosis not present

## 2017-07-28 DIAGNOSIS — Z13820 Encounter for screening for osteoporosis: Secondary | ICD-10-CM | POA: Insufficient documentation

## 2017-07-28 NOTE — Telephone Encounter (Signed)
The pt was notified of her bone density results. She's requesting a refill of meloxicam for her knee discomfort or maybe you can recommend something she can take otc that will help. Please advise

## 2017-07-28 NOTE — Telephone Encounter (Signed)
Pt can take naproxen sodium(Aleeve) OTC.  Take 1-2 tablets twice daily prn knee pain.

## 2017-07-28 NOTE — Telephone Encounter (Signed)
-----   Message from Galen ManilaLauren Renee Kennedy, NP sent at 07/28/2017  1:37 PM EDT ----- Your bone density is currently normal.  Continue regular dietary calcium intake, but you will need no other treatments.  We can screen bone density again in about 5 years since it was normal.  If at that time it remains normal without bone loss, you may not need additional bone density scans.  Continue discussing this with your PCP as you get older during your regular health screenings.

## 2017-07-29 NOTE — Telephone Encounter (Signed)
The pt was notified. No questions or concerns. 

## 2019-04-08 ENCOUNTER — Emergency Department: Payer: Self-pay

## 2019-04-08 ENCOUNTER — Other Ambulatory Visit: Payer: Self-pay

## 2019-04-08 ENCOUNTER — Emergency Department
Admission: EM | Admit: 2019-04-08 | Discharge: 2019-04-08 | Disposition: A | Discharge status: Home / Self Care | Payer: Self-pay | Attending: Emergency Medicine | Admitting: Emergency Medicine

## 2019-04-08 ENCOUNTER — Encounter: Payer: Self-pay | Admitting: *Deleted

## 2019-04-08 DIAGNOSIS — R0789 Other chest pain: Secondary | ICD-10-CM | POA: Insufficient documentation

## 2019-04-08 DIAGNOSIS — E876 Hypokalemia: Secondary | ICD-10-CM | POA: Insufficient documentation

## 2019-04-08 DIAGNOSIS — F17200 Nicotine dependence, unspecified, uncomplicated: Secondary | ICD-10-CM | POA: Insufficient documentation

## 2019-04-08 LAB — BASIC METABOLIC PANEL
Anion gap: 5 (ref 5–15)
BUN: 15 mg/dL (ref 6–20)
CO2: 27 mmol/L (ref 22–32)
Calcium: 8.9 mg/dL (ref 8.9–10.3)
Chloride: 110 mmol/L (ref 98–111)
Creatinine, Ser: 0.83 mg/dL (ref 0.44–1.00)
GFR calc Af Amer: >60 mL/min (ref >60)
GFR calc non Af Amer: >60 mL/min (ref >60)
Glucose, Bld: 116 mg/dL — ABNORMAL HIGH (ref 70–99)
Potassium: 3.2 mmol/L — ABNORMAL LOW (ref 3.5–5.1)
Sodium: 142 mmol/L (ref 135–145)

## 2019-04-08 LAB — CBC
HCT: 38.1 % (ref 36.0–46.0)
Hemoglobin: 12.6 g/dL (ref 12.0–15.0)
MCH: 28.4 pg (ref 26.0–34.0)
MCHC: 33.1 g/dL (ref 30.0–36.0)
MCV: 86 fL (ref 80.0–100.0)
Platelets: 253 10*3/uL (ref 150–400)
RBC: 4.43 MIL/uL (ref 3.87–5.11)
RDW: 13.7 % (ref 11.5–15.5)
WBC: 5.7 10*3/uL (ref 4.0–10.5)
nRBC: 0 % (ref 0.0–0.2)

## 2019-04-08 LAB — TROPONIN I (HIGH SENSITIVITY): Troponin I (High Sensitivity): <2 ng/L (ref <18)

## 2019-04-08 MED ORDER — SODIUM CHLORIDE 0.9% FLUSH: 3.0000 mL | Freq: Once | INTRAVENOUS | Status: DC

## 2019-04-08 MED ORDER — HYDROCODONE-ACETAMINOPHEN 5-325 MG PO TABS: 1.0000 | ORAL_TABLET | Freq: Four times a day (QID) | ORAL | 0 refills | Status: DC | PRN

## 2019-04-08 MED ORDER — IBUPROFEN 800 MG PO TABS: 800.0000 mg | ORAL_TABLET | Freq: Three times a day (TID) | ORAL | 0 refills | Status: DC | PRN

## 2019-04-08 MED ORDER — KETOROLAC TROMETHAMINE 30 MG/ML IJ SOLN
30.0000 mg | Freq: Once | INTRAMUSCULAR | Status: AC
  Administered 2019-04-08: 30 mg via INTRAMUSCULAR
  Filled 2019-04-08: qty 1

## 2019-04-08 MED ORDER — POTASSIUM CHLORIDE CRYS ER 20 MEQ PO TBCR
40.0000 meq | EXTENDED_RELEASE_TABLET | Freq: Once | ORAL | Status: AC
  Administered 2019-04-08: 40 meq via ORAL
  Filled 2019-04-08: qty 2

## 2019-04-08 NOTE — ED Triage Notes (Signed)
Pt has pain in center of chest since yesterday.  Pt reports lifting heavy objects at work and thinks she may have pulled a muscle.  No sob. No cough.  No n/v  Pt alert  Speech clear.

## 2019-04-08 NOTE — ED Provider Notes (Signed)
[facility] Emergency Department Provider Note   ____________________________________________   First MD Initiated Contact with Patient 04/08/19 [phone]     (approximate)  I have reviewed the triage vital signs and the nursing notes.   HISTORY  Chief Complaint Chest Pain    HPI Uriah Trueba is a 58 y.o. female who presents to the ED from work with a chief complaint of chest pain.  Patient reports heavy lifting and strenuous labor at work.  Noted pain to her central chest on Monday which is exacerbated by movement.  Thinks she has pulled a muscle.  Denies associated diaphoresis, palpitations, nausea/vomiting or dizziness.  Denies cough, fever, shortness of breath, abdominal pain, nausea or vomiting.  Denies recent travel, trauma or exposure to persons diagnosed with coronavirus.       Past Medical History:  Diagnosis Date  . Back pain     Patient Active Problem List   Diagnosis Date Noted  . Venous insufficiency 05/27/2017    Past Surgical History:  Procedure Laterality Date  . COLONOSCOPY WITH PROPOFOL N/A 07/21/2017   Procedure: COLONOSCOPY WITH PROPOFOL;  Surgeon: Jonathon Bellows, MD;  Location: [facility] ENDOSCOPY;  Service: Gastroenterology;  Laterality: N/A;  . KNEE SURGERY      Prior to Admission medications   Not on File    Allergies Patient has no known allergies.  Family History  Problem Relation Age of Onset  . Cancer Mother   . Cancer Father        unknown type  . Hypertension Sister   . Hypertension Brother   . Hyperlipidemia Brother   . Breast cancer Maternal Grandmother        likely breast ca - mastectomy  . Hypertension Brother   . Diabetes Brother   . Hypertension Brother   . Heart attack Neg Hx   . Stroke Neg Hx   . Colon cancer Neg Hx   . Ovarian cancer Neg Hx     Social History Social History   Tobacco Use  . Smoking status: Current Every Day Smoker    Packs/day: 0.50    Years: 1.00    Pack years: 0.50  .  Smokeless tobacco: Never Used  . Tobacco comment: age [age] smoked 1/2 ppd, quit x 20 years, now smoking 1 year  Substance Use Topics  . Alcohol use: No  . Drug use: No    Review of Systems  Constitutional: No fever/chills Eyes: No visual changes. ENT: No sore throat. Cardiovascular: Positive for chest pain. Respiratory: Denies shortness of breath. Gastrointestinal: No abdominal pain.  No nausea, no vomiting.  No diarrhea.  No constipation. Genitourinary: Negative for dysuria. Musculoskeletal: Negative for back pain. Skin: Negative for rash. Neurological: Negative for headaches, focal weakness or numbness.   ____________________________________________   PHYSICAL EXAM:  VITAL SIGNS: ED Triage Vitals  Enc Vitals Group     BP 04/08/19 0247 128/78     Pulse Rate 04/08/19 0247 88     Resp 04/08/19 0247 16     Temp 04/08/19 0247 98.7 F (37.1 C)     Temp Source 04/08/19 0247 Oral     SpO2 04/08/19 0247 99 %     Weight --      Height --      Head Circumference --      Peak Flow --      Pain Score 04/08/19 0224 7     Pain Loc --      Pain Edu? --  Excl. in GC? --     Constitutional: Alert and oriented. Well appearing and in no acute distress. Eyes: Conjunctivae are normal. PERRL. EOMI. Head: Atraumatic. Nose: No congestion/rhinnorhea. Mouth/Throat: Mucous membranes are moist.  Oropharynx non-erythematous. Neck: No stridor.   Cardiovascular: Normal rate, regular rhythm. Grossly normal heart sounds.  Good peripheral circulation. Respiratory: Normal respiratory effort.  No retractions. Lungs CTAB.  Central anterior chest tender to palpation and with movement of trunk. Gastrointestinal: Soft and nontender. No distention. No abdominal bruits. No CVA tenderness. Musculoskeletal: No lower extremity tenderness nor edema.  No joint effusions. Neurologic:  Normal speech and language. No gross focal neurologic deficits are appreciated. No gait instability. Skin:  Skin is  warm, dry and intact. No rash noted. Psychiatric: Mood and affect are normal. Speech and behavior are normal.  ____________________________________________   LABS (all labs ordered are listed, but only abnormal results are displayed)  Labs Reviewed  BASIC METABOLIC PANEL - Abnormal; Notable for the following components:      Result Value   Potassium 3.2 (*)    Glucose, Bld 116 (*)    All other components within normal limits  CBC  TROPONIN I (HIGH SENSITIVITY)  TROPONIN I (HIGH SENSITIVITY)   ____________________________________________  EKG  ED ECG REPORT I, , J, the attending physician, personally viewed and interpreted this ECG.   Date: 04/08/2019  EKG Time: 0211  Rate: 74  Rhythm: normal EKG, normal sinus rhythm  Axis: Normal  Intervals:none  ST&T Change: Nonspecific  ____________________________________________  RADIOLOGY  ED MD interpretation: No acute cardiopulmonary process  Official radiology report(s): Dg Chest 2 View  Result Date: 04/08/2019 CLINICAL DATA:  Chest pain. EXAM: CHEST - 2 VIEW COMPARISON:  01/22/2005 FINDINGS: The heart size and mediastinal contours are within normal limits. Both lungs are clear. The visualized skeletal structures are unremarkable. IMPRESSION: Normal exam. Electronically Signed   By: Francene BoyersJames  Maxwell M.D.   On: 04/08/2019 05:40    ____________________________________________   PROCEDURES  Procedure(s) performed (including Critical Care):  Procedures   ____________________________________________   INITIAL IMPRESSION / ASSESSMENT AND PLAN / ED COURSE  As part of my medical decision making, I reviewed the following data within the electronic MEDICAL RECORD NUMBER Nursing notes reviewed and incorporated, Labs reviewed, EKG interpreted, Old chart reviewed, Radiograph reviewed and Notes from prior ED visits     Cory Roughenrene Buchner was evaluated in Emergency Department on 04/08/2019 for the symptoms described in the history of  present illness. She was evaluated in the context of the global COVID-19 pandemic, which necessitated consideration that the patient might be at risk for infection with the SARS-CoV-2 virus that causes COVID-19. Institutional protocols and algorithms that pertain to the evaluation of patients at risk for COVID-19 are in a state of rapid change based on information released by regulatory bodies including the CDC and federal and state organizations. These policies and algorithms were followed during the patient's care in the ED.   58 year old female who presents with reproducible chest pain in the setting of heavy lifting at work. Differential diagnosis includes, but is not limited to, ACS, aortic dissection, pulmonary embolism, cardiac tamponade, pneumothorax, pneumonia, pericarditis, myocarditis, GI-related causes including esophagitis/gastritis, and musculoskeletal chest wall pain.    High-sensitivity troponin and EKG are unremarkable.  Feel repeat troponin not necessary as patient has been having pain for several days.  Will treat with NSAIDs, analgesia and patient will follow-up with her PCP.  Will replete potassium orally.  Strict return precautions given.  Patient verbalizes  understanding and agrees with plan of care.      ____________________________________________   FINAL CLINICAL IMPRESSION(S) / ED DIAGNOSES  Final diagnoses:  Chest wall pain  Hypokalemia     ED Discharge Orders    None       Note:  This document was prepared using Dragon voice recognition software and may include unintentional dictation errors.   Irean HongSung,  J, MD 04/08/19 [phone]

## 2019-04-08 NOTE — ED Notes (Signed)
Patient transported to X-ray 

## 2019-04-08 NOTE — Discharge Instructions (Signed)
1.  You may take pain medicines as needed (Motrin/Norco #15). 2.  Apply moist heat to affected area several times daily. 3.  Return to the ER for worsening symptoms, persistent vomiting, difficulty breathing or other concerns. 

## 2019-11-08 ENCOUNTER — Ambulatory Visit (INDEPENDENT_AMBULATORY_CARE_PROVIDER_SITE_OTHER): Payer: PRIVATE HEALTH INSURANCE | Admitting: Family Medicine

## 2019-11-08 ENCOUNTER — Other Ambulatory Visit: Payer: Self-pay

## 2019-11-08 ENCOUNTER — Telehealth: Payer: Self-pay

## 2019-11-08 ENCOUNTER — Encounter: Payer: Self-pay | Admitting: Family Medicine

## 2019-11-08 DIAGNOSIS — L02214 Cutaneous abscess of groin: Secondary | ICD-10-CM

## 2019-11-08 MED ORDER — DOXYCYCLINE HYCLATE 100 MG PO TABS: 100.0000 mg | ORAL_TABLET | Freq: Two times a day (BID) | ORAL | 0 refills | Status: DC

## 2019-11-08 NOTE — Progress Notes (Signed)
Virtual Visit via Telephone The purpose of this virtual visit is to provide medical care while limiting exposure to the novel coronavirus (COVID19) for both patient and office staff.  Consent was obtained for phone visit:  Yes.   Answered questions that patient had about telehealth interaction:  Yes.   I discussed the limitations, risks, security and privacy concerns of performing an evaluation and management service by telephone. I also discussed with the patient that there may be a patient responsible charge related to this service. The patient expressed understanding and agreed to proceed.  Patient Location: Home Provider Location: [facility])  ---------------------------------------------------------------------- Chief Complaint  Patient presents with  . Cyst    onset 3 days     S: Reviewed CMA documentation. I have called patient and gathered additional HPI as follows:  Cyst, R upper thigh Reports that symptoms started about 3 days ago with swelling and painful to touch and difficulty with walking while it rubs in her groin in the crease. Has not had a similar episode of swelling. - Not taken any medicine for it. Not done any warm compresses - She works in [employer] and this is limiting her from walking well due to the cyst.  Denies any known or suspected exposure to person with or possibly with COVID19.  Denies any fevers, chills, sweats, body ache, cough, shortness of breath, sinus pain or pressure, headache, abdominal pain, diarrhea  Past Medical History:  Diagnosis Date  . Back pain    Social History   Tobacco Use  . Smoking status: Current Every Day Smoker    Packs/day: 0.50    Years: 1.00    Pack years: 0.50  . Smokeless tobacco: Never Used  . Tobacco comment: age [age] smoked 1/2 ppd, quit x 20 years, now smoking 1 year  Substance Use Topics  . Alcohol use: No  . Drug use: No    Current Outpatient Medications:  .  ibuprofen (ADVIL)  800 MG tablet, Take 1 tablet (800 mg total) by mouth every 8 (eight) hours as needed for moderate pain., Disp: 15 tablet, Rfl: 0 .  doxycycline (VIBRA-TABS) 100 MG tablet, Take 1 tablet (100 mg total) by mouth 2 (two) times daily. For 7 days. Take with full glass of water, stay upright 30 min after taking., Disp: 14 tablet, Rfl: 0 .  HYDROcodone-acetaminophen (NORCO) 5-325 MG tablet, Take 1 tablet by mouth every 6 (six) hours as needed for moderate pain. (Patient not taking: Reported on 11/08/2019), Disp: 15 tablet, Rfl: 0  Depression screen [employer] 2/9 11/08/2019 06/05/2017 05/22/2017  Decreased Interest 0 0 0  Down, Depressed, Hopeless 0 0 0  PHQ - 2 Score 0 0 0  Altered sleeping - 0 -  Tired, decreased energy - 1 -  Change in appetite - 0 -  Feeling bad or failure about yourself  - 0 -  Trouble concentrating - 0 -  Moving slowly or fidgety/restless - 0 -  Suicidal thoughts - 0 -  PHQ-9 Score - 1 -  Difficult doing work/chores - Not difficult at all -    No flowsheet data found.  -------------------------------------------------------------------------- O: No physical exam performed due to remote telephone encounter.  Lab results reviewed.  No results found for this or any previous visit (from the past 2160 hour(s)).  -------------------------------------------------------------------------- A&P:  Problem List Items Addressed This Visit    None    Visit Diagnoses    Abscess of right groin    -  Primary  Relevant Medications   doxycycline (VIBRA-TABS) 100 MG tablet     Consistent with new acute left groin abscess based on history Not consistent with cellulitis No history of recurrent abcesses in this or other areas, not consistent with chronic suppurativa hidradenitis. No recent antibiotics.  Plan: 1. Offer empiric Doxycycline antibiotic 100mg  twice daily for 7 days. Take with full glass of water and stay upright for at least 30 min after taking, may be seated or standing, but  should NOT lay down. This is just a safety precaution, if this medicine does not go all the way down throat well it could cause some burning discomfort to throat and esophagus. 2. Warm compresses per handout 3. Return precautions given if worsening   Meds ordered this encounter  Medications  . doxycycline (VIBRA-TABS) 100 MG tablet    Sig: Take 1 tablet (100 mg total) by mouth 2 (two) times daily. For 7 days. Take with full glass of water, stay upright 30 min after taking.    Dispense:  14 tablet    Refill:  0    Follow-up: - Return in 1 week as needed if not improved  Patient verbalizes understanding with the above medical recommendations including the limitation of remote medical advice.  Specific follow-up and call-back criteria were given for patient to follow-up or seek medical care more urgently if needed.   - Time spent in direct consultation with patient on phone: 8 minutes  Nobie Putnam, [facility] 11/08/2019, 3:34 PM

## 2019-11-08 NOTE — Telephone Encounter (Signed)
Open in error

## 2021-05-18 ENCOUNTER — Emergency Department
Admission: EM | Admit: 2021-05-18 | Discharge: 2021-05-18 | Disposition: A | Discharge status: Home / Self Care | Payer: No Typology Code available for payment source | Attending: Emergency Medicine | Admitting: Emergency Medicine

## 2021-05-18 ENCOUNTER — Emergency Department: Payer: No Typology Code available for payment source

## 2021-05-18 ENCOUNTER — Other Ambulatory Visit: Payer: Self-pay

## 2021-05-18 DIAGNOSIS — L03115 Cellulitis of right lower limb: Secondary | ICD-10-CM | POA: Diagnosis not present

## 2021-05-18 DIAGNOSIS — I872 Venous insufficiency (chronic) (peripheral): Secondary | ICD-10-CM | POA: Insufficient documentation

## 2021-05-18 DIAGNOSIS — F17210 Nicotine dependence, cigarettes, uncomplicated: Secondary | ICD-10-CM | POA: Diagnosis not present

## 2021-05-18 DIAGNOSIS — R2241 Localized swelling, mass and lump, right lower limb: Secondary | ICD-10-CM | POA: Diagnosis present

## 2021-05-18 LAB — CBC
HCT: 38.9 % (ref 36.0–46.0)
Hemoglobin: 12.8 g/dL (ref 12.0–15.0)
MCH: 28.3 pg (ref 26.0–34.0)
MCHC: 32.9 g/dL (ref 30.0–36.0)
MCV: 86.1 fL (ref 80.0–100.0)
Platelets: 249 10*3/uL (ref 150–400)
RBC: 4.52 MIL/uL (ref 3.87–5.11)
RDW: 14.5 % (ref 11.5–15.5)
WBC: 12.2 10*3/uL — ABNORMAL HIGH (ref 4.0–10.5)
nRBC: 0 % (ref 0.0–0.2)

## 2021-05-18 LAB — BASIC METABOLIC PANEL
Anion gap: 9 (ref 5–15)
BUN: 10 mg/dL (ref 6–20)
CO2: 24 mmol/L (ref 22–32)
Calcium: 9 mg/dL (ref 8.9–10.3)
Chloride: 104 mmol/L (ref 98–111)
Creatinine, Ser: 0.9 mg/dL (ref 0.44–1.00)
GFR, Estimated: >60 mL/min (ref >60)
Glucose, Bld: 104 mg/dL — ABNORMAL HIGH (ref 70–99)
Potassium: 3.5 mmol/L (ref 3.5–5.1)
Sodium: 137 mmol/L (ref 135–145)

## 2021-05-18 MED ORDER — CEPHALEXIN 500 MG PO CAPS: 500.0000 mg | ORAL_CAPSULE | Freq: Four times a day (QID) | ORAL | 0 refills | Status: DC

## 2021-05-18 MED ORDER — OXYCODONE-ACETAMINOPHEN 5-325 MG PO TABS
1.0000 | ORAL_TABLET | Freq: Once | ORAL | Status: AC
  Administered 2021-05-18: 1 via ORAL
  Filled 2021-05-18: qty 1

## 2021-05-18 NOTE — ED Notes (Signed)
See triage note  Presents with some right leg pain  States she noticed some swelling with some redness

## 2021-05-18 NOTE — ED Triage Notes (Signed)
Pt come with c/o right leg pain and swelling. Pt has redness and warmth noted to leg. Pt states fever at home. No fever at this time.

## 2021-05-18 NOTE — ED Provider Notes (Signed)
[facility] Emergency Department Provider Note   ____________________________________________   Event Date/Time   First MD Initiated Contact with Patient 05/18/21 0945     (approximate)  I have reviewed the triage vital signs and the nursing notes.   HISTORY  Chief Complaint cellulitis    HPI Amber Figueroa is a 60 y.o. female with past medical history of venous insufficiency who presents to the ED complaining of leg pain and swelling.  Patient reports that she has had 2 days of increasing pain and swelling in the lower portion of her right leg.  She has noticed redness to her skin and circumferential swelling from her mid calf down to her ankle and the top of her right foot.  She denies any falls or other injuries to this foot or leg.  Pain does seem to extend up into the medial portion of her right thigh.  She denies any history of DVT or PE, currently denies any chest pain or shortness of breath.  She has not had any recent fevers, nausea, or vomiting.        Past Medical History:  Diagnosis Date   Back pain     Patient Active Problem List   Diagnosis Date Noted   Venous insufficiency 05/27/2017    Past Surgical History:  Procedure Laterality Date   COLONOSCOPY WITH PROPOFOL N/A 07/21/2017   Procedure: COLONOSCOPY WITH PROPOFOL;  Surgeon: Wyline Mood, MD;  Location: [facility] ENDOSCOPY;  Service: Gastroenterology;  Laterality: N/A;   KNEE SURGERY      Prior to Admission medications   Medication Sig Start Date End Date Taking? Authorizing Provider  cephALEXin (KEFLEX) 500 MG capsule Take 1 capsule (500 mg total) by mouth 4 (four) times daily for 7 days. 05/18/21 05/25/21 Yes Chesley Noon, MD  ibuprofen (ADVIL) 800 MG tablet Take 1 tablet (800 mg total) by mouth every 8 (eight) hours as needed for moderate pain. 04/08/19   Irean Hong, MD    Allergies Patient has no known allergies.  Family History  Problem Relation Age of Onset   Cancer Mother     Cancer Father        unknown type   Hypertension Sister    Hypertension Brother    Hyperlipidemia Brother    Breast cancer Maternal Grandmother        likely breast ca - mastectomy   Hypertension Brother    Diabetes Brother    Hypertension Brother    Heart attack Neg Hx    Stroke Neg Hx    Colon cancer Neg Hx    Ovarian cancer Neg Hx     Social History Social History   Tobacco Use   Smoking status: Every Day    Packs/day: 0.50    Years: 1.00    Pack years: 0.50    Types: Cigarettes   Smokeless tobacco: Never   Tobacco comments:    age [age] smoked 1/2 ppd, quit x 20 years, now smoking 1 year  Vaping Use   Vaping Use: Never used  Substance Use Topics   Alcohol use: No   Drug use: No    Review of Systems  Constitutional: No fever/chills Eyes: No visual changes. ENT: No sore throat. Cardiovascular: Denies chest pain. Respiratory: Denies shortness of breath. Gastrointestinal: No abdominal pain.  No nausea, no vomiting.  No diarrhea.  No constipation. Genitourinary: Negative for dysuria. Musculoskeletal: Negative for back pain.  Positive for right lower extremity pain and swelling. Skin: Negative for rash.  Neurological: Negative for headaches, focal weakness or numbness.  ____________________________________________   PHYSICAL EXAM:  VITAL SIGNS: ED Triage Vitals  Enc Vitals Group     BP 05/18/21 0741 123/72     Pulse Rate 05/18/21 0741 87     Resp 05/18/21 0741 18     Temp 05/18/21 0741 98.7 F (37.1 C)     Temp Source 05/18/21 0741 Oral     SpO2 05/18/21 0741 98 %     Weight --      Height --      Head Circumference --      Peak Flow --      Pain Score 05/18/21 0745 10     Pain Loc --      Pain Edu? --      Excl. in GC? --     Constitutional: Alert and oriented. Eyes: Conjunctivae are normal. Head: Atraumatic. Nose: No congestion/rhinnorhea. Mouth/Throat: Mucous membranes are moist. Neck: Normal ROM Cardiovascular: Normal rate, regular  rhythm. Grossly normal heart sounds.  2+ DP pulses bilaterally. Respiratory: Normal respiratory effort.  No retractions. Lungs CTAB. Gastrointestinal: Soft and nontender. No distention. Genitourinary: deferred Musculoskeletal: Edema, erythema, and warmth noted circumferentially from mid calf to right ankle with edema to the dorsum of right foot.  Area is diffusely tender to palpation.  Range of motion intact to right ankle and right knee. Neurologic:  Normal speech and language. No gross focal neurologic deficits are appreciated. Skin:  Skin is warm, dry and intact. No rash noted. Psychiatric: Mood and affect are normal. Speech and behavior are normal.  ____________________________________________   LABS (all labs ordered are listed, but only abnormal results are displayed)  Labs Reviewed  CBC - Abnormal; Notable for the following components:      Result Value   WBC 12.2 (*)    All other components within normal limits  BASIC METABOLIC PANEL - Abnormal; Notable for the following components:   Glucose, Bld 104 (*)    All other components within normal limits    PROCEDURES  Procedure(s) performed (including Critical Care):  Procedures   ____________________________________________   INITIAL IMPRESSION / ASSESSMENT AND PLAN / ED COURSE      60 year old female with past medical history of venous insufficiency presents to the ED complaining of 2 days of increasing pain, swelling, and redness to her distal right lower extremity.  She is neurovascularly intact to the extremity with 2+ DP pulses bilaterally.  Appearance is concerning for cellulitis versus DVT and we will further assess with ultrasound.  Labs and vital signs are reassuring, no evidence of sepsis.  She denies any chest pain or shortness of breath to suggest PE.  We will treat pain with Percocet and reassess.  Ultrasound is negative for DVT, given this I suspect her symptoms are due to a cellulitis brought on by venous  insufficiency.  She is appropriate for discharge home with PCP follow-up, we will start her on Keflex.  She was counseled to return to the ED for new worsening symptoms, patient agrees with plan.      ____________________________________________   FINAL CLINICAL IMPRESSION(S) / ED DIAGNOSES  Final diagnoses:  Cellulitis of right leg  Venous insufficiency     ED Discharge Orders          Ordered    cephALEXin (KEFLEX) 500 MG capsule  4 times daily        05/18/21 1252             Note:  This document was prepared using Dragon voice recognition software and may include unintentional dictation errors.    Chesley Noon, MD 05/18/21 1253

## 2021-05-22 ENCOUNTER — Other Ambulatory Visit: Payer: Self-pay

## 2021-05-22 ENCOUNTER — Encounter: Payer: Self-pay | Admitting: Internal Medicine

## 2021-05-22 ENCOUNTER — Ambulatory Visit: Payer: PRIVATE HEALTH INSURANCE | Admitting: Internal Medicine

## 2021-05-22 VITALS — BP 136/75 | HR 74 | Temp 97.5°F | Resp 18 | Ht 71.0 in | Wt 205.2 lb

## 2021-05-22 DIAGNOSIS — E663 Overweight: Secondary | ICD-10-CM | POA: Diagnosis not present

## 2021-05-22 DIAGNOSIS — Z6826 Body mass index (BMI) 26.0-26.9, adult: Secondary | ICD-10-CM | POA: Insufficient documentation

## 2021-05-22 DIAGNOSIS — L03115 Cellulitis of right lower limb: Secondary | ICD-10-CM | POA: Diagnosis not present

## 2021-05-22 DIAGNOSIS — Z6828 Body mass index (BMI) 28.0-28.9, adult: Secondary | ICD-10-CM

## 2021-05-22 DIAGNOSIS — I872 Venous insufficiency (chronic) (peripheral): Secondary | ICD-10-CM | POA: Diagnosis not present

## 2021-05-22 DIAGNOSIS — Z6827 Body mass index (BMI) 27.0-27.9, adult: Secondary | ICD-10-CM | POA: Insufficient documentation

## 2021-05-22 DIAGNOSIS — I890 Lymphedema, not elsewhere classified: Secondary | ICD-10-CM | POA: Insufficient documentation

## 2021-05-22 MED ORDER — DOXYCYCLINE HYCLATE 100 MG PO TABS: 100.0000 mg | ORAL_TABLET | Freq: Two times a day (BID) | ORAL | 0 refills | Status: DC

## 2021-05-22 MED ORDER — LEVOFLOXACIN 500 MG PO TABS: 500.0000 mg | ORAL_TABLET | Freq: Every day | ORAL | 0 refills | Status: DC

## 2021-05-22 NOTE — Progress Notes (Signed)
Subjective:    Patient ID: Amber Figueroa, female    DOB: 03-04-1961, 60 y.o.   MRN: [id number]  HPI  Patient presents to clinic today for ER follow-up.  She presented to the ER 05/18/2021 with complaint of right lower extremity swelling, pain and redness.  Ultrasound was negative for DVT.  She was diagnosed with cellulitis secondary to venous insufficiency and treated with Keflex.  She has been taking the medication as prescribed.  Since discharge, she reports marginal improvement in her right lower extremity.  She reports persistent redness, swelling and discomfort.  She denies fever, chills, nausea or vomiting.  She reports a history of venous insufficiency since she was 60 years old.  She reports she has had multiple evaluation and work-up in the past.  She has been on diuretics in the past without any improvement in her symptoms.  She occasionally wears compression hose but is not currently wear them because they are causing pain due to to the infection.  Review of Systems     Past Medical History:  Diagnosis Date   Back pain     Current Outpatient Medications  Medication Sig Dispense Refill   cephALEXin (KEFLEX) 500 MG capsule Take 1 capsule (500 mg total) by mouth 4 (four) times daily for 7 days. 28 capsule 0   ibuprofen (ADVIL) 800 MG tablet Take 1 tablet (800 mg total) by mouth every 8 (eight) hours as needed for moderate pain. 15 tablet 0   No current facility-administered medications for this visit.    No Known Allergies  Family History  Problem Relation Age of Onset   Cancer Mother    Cancer Father        unknown type   Hypertension Sister    Hypertension Brother    Hyperlipidemia Brother    Breast cancer Maternal Grandmother        likely breast ca - mastectomy   Hypertension Brother    Diabetes Brother    Hypertension Brother    Heart attack Neg Hx    Stroke Neg Hx    Colon cancer Neg Hx    Ovarian cancer Neg Hx     Social History   Socioeconomic History    Marital status: Married    Spouse name: Not on file   Number of children: Not on file   Years of education: Not on file   Highest education level: Not on file  Occupational History   Not on file  Tobacco Use   Smoking status: Every Day    Packs/day: 0.50    Years: 1.00    Pack years: 0.50    Types: Cigarettes   Smokeless tobacco: Never   Tobacco comments:    age [age] smoked 1/2 ppd, quit x 20 years, now smoking 1 year  Vaping Use   Vaping Use: Never used  Substance and Sexual Activity   Alcohol use: No   Drug use: No   Sexual activity: Never  Other Topics Concern   Not on file  Social History Narrative   Not on file   Social Determinants of Health   Financial Resource Strain: Not on file  Food Insecurity: Not on file  Transportation Needs: Not on file  Physical Activity: Not on file  Stress: Not on file  Social Connections: Not on file  Intimate Partner Violence: Not on file     Constitutional: Denies fever, malaise, fatigue, headache or abrupt weight changes.  Respiratory: Denies difficulty breathing, shortness of breath, cough or  sputum production.   Cardiovascular: Patient reports swelling in legs.  Denies chest pain, chest tightness, palpitations or swelling in the hands.  Musculoskeletal: Denies decrease in range of motion, difficulty with gait, muscle pain or joint pain and swelling.  Skin: Patient reports redness of right lower extremity.  Denies rashes, lesions or ulcercations.    No other specific complaints in a complete review of systems (except as listed in HPI above).  Objective:   Physical Exam  BP 136/75 (BP Location: Left Arm, Patient Position: Sitting, Cuff Size: Normal)   Pulse 74   Temp (!) 97.5 F (36.4 C) (Temporal)   Resp 18   Ht 5\' 11"  (1.803 m)   Wt 205 lb 3.2 oz (93.1 kg)   SpO2 100%   BMI 28.62 kg/m   Wt Readings from Last 3 Encounters:  05/18/21 184 lb 15.5 oz (83.9 kg)  07/21/17 185 lb (83.9 kg)  06/05/17 185 lb 3.2 oz (84  kg)    General: Appears her stated age, overweight, in NAD. Skin: Circumferential redness from the right ankle to the mid shin noted, tender with palpation and warm to touch. HEENT: Head: normal shape and size; Eyes: sclera white and EOMs intact;  Cardiovascular: Normal rate and rhythm. S1,S2 noted.  No murmur, rubs or gallops noted.  1+ pitting BLE edema noted. Pulmonary/Chest: Normal effort and positive vesicular breath sounds. No respiratory distress. No wheezes, rales or ronchi noted.  Musculoskeletal: Gait slow and steady without device. Neurological: Alert and oriented.    BMET    Component Value Date/Time   NA 137 05/18/2021 0747   K 3.5 05/18/2021 0747   CL 104 05/18/2021 0747   CO2 24 05/18/2021 0747   GLUCOSE 104 (H) 05/18/2021 0747   BUN 10 05/18/2021 0747   CREATININE 0.90 05/18/2021 0747   CREATININE 0.75 06/09/2017 0832   CALCIUM 9.0 05/18/2021 0747   GFRNONAA >60 05/18/2021 0747   GFRNONAA 89 06/09/2017 0832   GFRAA >60 04/08/2019 0226   GFRAA 103 06/09/2017 0832    Lipid Panel     Component Value Date/Time   CHOL 169 06/09/2017 0832   TRIG 52 06/09/2017 0832   HDL 63 06/09/2017 0832   CHOLHDL 2.7 06/09/2017 0832   LDLCALC 93 06/09/2017 0832    CBC    Component Value Date/Time   WBC 12.2 (H) 05/18/2021 0747   RBC 4.52 05/18/2021 0747   HGB 12.8 05/18/2021 0747   HCT 38.9 05/18/2021 0747   PLT 249 05/18/2021 0747   MCV 86.1 05/18/2021 0747   MCH 28.3 05/18/2021 0747   MCHC 32.9 05/18/2021 0747   RDW 14.5 05/18/2021 0747   LYMPHSABS 1,410 06/09/2017 0832   EOSABS 291 06/09/2017 0832   BASOSABS 41 06/09/2017 0832    Hgb A1C Lab Results  Component Value Date   HGBA1C 5.0 06/09/2017            Assessment & Plan:   ER follow-up for CVI, Cellulitis of RLE:  ER notes, labs and imaging reviewed Marginal improvement with Keflex, will D/C Rx for Doxycycline 100 mg twice daily x10 days Rx for Levaquin 500 mg daily x7 days Encourage  low-salt diet, elevation and compression  Return precautions discussed 08/09/2017, NP This visit occurred during the SARS-CoV-2 public health emergency.  Safety protocols were in place, including screening questions prior to the visit, additional usage of staff PPE, and extensive cleaning of exam room while observing appropriate contact time as indicated for disinfecting solutions.

## 2021-05-22 NOTE — Patient Instructions (Signed)
Cellulitis, Adult Cellulitis is a skin infection. The infected area is often warm, red, swollen, and sore. It occurs most often in the arms and lower legs. It is very important to get treated for this condition. What are the causes? This condition is caused by bacteria. The bacteria enter through a break in the skin, such as a cut, burn, insect bite, open sore, or crack. What increases the risk? This condition is more likely to occur in people who: Have a weak body defense system (immune system). Have open cuts, burns, bites, or scrapes on the skin. Are older than 60 years of age. Have a blood sugar problem (diabetes). Have a long-lasting (chronic) liver disease (cirrhosis) or kidney disease. Are very overweight (obese). Have a skin problem, such as: Itchy rash (eczema). Slow movement of blood in the veins (venous stasis). Fluid buildup below the skin (edema). Have been treated with high-energy rays (radiation). Use IV drugs. What are the signs or symptoms? Symptoms of this condition include: Skin that is: Red. Streaking. Spotting. Swollen. Sore or painful when you touch it. Warm. A fever. Chills. Blisters. How is this diagnosed? This condition is diagnosed based on: Medical history. Physical exam. Blood tests. Imaging tests. How is this treated? Treatment for this condition may include: Medicines to treat infections or allergies. Home care, such as: Rest. Placing cold or warm cloths (compresses) on the skin. Hospital care, if the condition is very bad. Follow these instructions at home: Medicines Take over-the-counter and prescription medicines only as told by your doctor. If you were prescribed an antibiotic medicine, take it as told by your doctor. Do not stop taking it even if you start to feel better. General instructions  Drink enough fluid to keep your pee (urine) pale yellow. Do not touch or rub the infected area. Raise (elevate) the infected area above the  level of your heart while you are sitting or lying down. Place cold or warm cloths on the area as told by your doctor. Keep all follow-up visits as told by your doctor. This is important. Contact a doctor if: You have a fever. You do not start to get better after 1-2 days of treatment. Your bone or joint under the infected area starts to hurt after the skin has healed. Your infection comes back. This can happen in the same area or another area. You have a swollen bump in the area. You have new symptoms. You feel ill and have muscle aches and pains. Get help right away if: Your symptoms get worse. You feel very sleepy. You throw up (vomit) or have watery poop (diarrhea) for a long time. You see red streaks coming from the area. Your red area gets larger. Your red area turns dark in color. These symptoms may represent a serious problem that is an emergency. Do not wait to see if the symptoms will go away. Get medical help right away. Call your local emergency services (911 in the [place] Do not drive yourself to the hospital. Summary Cellulitis is a skin infection. The area is often warm, red, swollen, and sore. This condition is treated with medicines, rest, and cold and warm cloths. Take all medicines only as told by your doctor. Tell your doctor if symptoms do not start to get better after 1-2 days of treatment. This information is not intended to replace advice given to you by your health care provider. Make sure you discuss any questions you have with your health care provider. Document Revised: 02/05/2018 Document Reviewed:   02/05/2018 Elsevier Patient Education  2022 [employer]  

## 2021-05-22 NOTE — Assessment & Plan Note (Signed)
Encourage diet and exercise for weight loss 

## 2021-07-04 ENCOUNTER — Ambulatory Visit: Payer: PRIVATE HEALTH INSURANCE | Admitting: Internal Medicine

## 2021-07-04 ENCOUNTER — Other Ambulatory Visit: Payer: Self-pay

## 2021-07-04 ENCOUNTER — Encounter: Payer: Self-pay | Admitting: Internal Medicine

## 2021-07-04 VITALS — BP 139/91 | HR 88 | Temp 97.1°F | Resp 18 | Ht 71.0 in | Wt 207.6 lb

## 2021-07-04 DIAGNOSIS — L03116 Cellulitis of left lower limb: Secondary | ICD-10-CM

## 2021-07-04 DIAGNOSIS — I872 Venous insufficiency (chronic) (peripheral): Secondary | ICD-10-CM | POA: Diagnosis not present

## 2021-07-04 MED ORDER — CEPHALEXIN 500 MG PO CAPS: 500.0000 mg | ORAL_CAPSULE | Freq: Three times a day (TID) | ORAL | 0 refills | Status: AC

## 2021-07-04 NOTE — Patient Instructions (Signed)
Cellulitis, Adult Cellulitis is a skin infection. The infected area is often warm, red, swollen, and sore. It occurs most often in the arms and lower legs. It is very important to get treated for this condition. What are the causes? This condition is caused by bacteria. The bacteria enter through a break in the skin, such as a cut, burn, insect bite, open sore, or crack. What increases the risk? This condition is more likely to occur in people who: Have a weak body defense system (immune system). Have open cuts, burns, bites, or scrapes on the skin. Are older than 60 years of age. Have a blood sugar problem (diabetes). Have a long-lasting (chronic) liver disease (cirrhosis) or kidney disease. Are very overweight (obese). Have a skin problem, such as: Itchy rash (eczema). Slow movement of blood in the veins (venous stasis). Fluid buildup below the skin (edema). Have been treated with high-energy rays (radiation). Use IV drugs. What are the signs or symptoms? Symptoms of this condition include: Skin that is: Red. Streaking. Spotting. Swollen. Sore or painful when you touch it. Warm. A fever. Chills. Blisters. How is this diagnosed? This condition is diagnosed based on: Medical history. Physical exam. Blood tests. Imaging tests. How is this treated? Treatment for this condition may include: Medicines to treat infections or allergies. Home care, such as: Rest. Placing cold or warm cloths (compresses) on the skin. Hospital care, if the condition is very bad. Follow these instructions at home: Medicines Take over-the-counter and prescription medicines only as told by your doctor. If you were prescribed an antibiotic medicine, take it as told by your doctor. Do not stop taking it even if you start to feel better. General instructions  Drink enough fluid to keep your pee (urine) pale yellow. Do not touch or rub the infected area. Raise (elevate) the infected area above the  level of your heart while you are sitting or lying down. Place cold or warm cloths on the area as told by your doctor. Keep all follow-up visits as told by your doctor. This is important. Contact a doctor if: You have a fever. You do not start to get better after 1-2 days of treatment. Your bone or joint under the infected area starts to hurt after the skin has healed. Your infection comes back. This can happen in the same area or another area. You have a swollen bump in the area. You have new symptoms. You feel ill and have muscle aches and pains. Get help right away if: Your symptoms get worse. You feel very sleepy. You throw up (vomit) or have watery poop (diarrhea) for a long time. You see red streaks coming from the area. Your red area gets larger. Your red area turns dark in color. These symptoms may represent a serious problem that is an emergency. Do not wait to see if the symptoms will go away. Get medical help right away. Call your local emergency services (911 in the [place] Do not drive yourself to the hospital. Summary Cellulitis is a skin infection. The area is often warm, red, swollen, and sore. This condition is treated with medicines, rest, and cold and warm cloths. Take all medicines only as told by your doctor. Tell your doctor if symptoms do not start to get better after 1-2 days of treatment. This information is not intended to replace advice given to you by your health care provider. Make sure you discuss any questions you have with your health care provider. Document Revised: 02/05/2018 Document Reviewed:   02/05/2018 Elsevier Patient Education  2022 [employer]  

## 2021-07-04 NOTE — Progress Notes (Signed)
Subjective:    Patient ID: Amber Figueroa, female    DOB: April 02, 1961, 60 y.o.   MRN: [id number]  HPI  Patient presents to clinic today with complaint of left leg pain and swelling.  She reports this started 4 days ago.  She describes the pain as stinging and tingling. She denies any recent injury, scratch or insect bite to the area. She did go on vacation where she did a lot more walking than usual. She has a history of CVI and has had cellulitis because of this in the past, recent hospital visit 04/2021 for the same. She has not taken any medications OTC for this.  Review of Systems     Past Medical History:  Diagnosis Date   Back pain     Current Outpatient Medications  Medication Sig Dispense Refill   doxycycline (VIBRA-TABS) 100 MG tablet Take 1 tablet (100 mg total) by mouth 2 (two) times daily. 20 tablet 0   ibuprofen (ADVIL) 800 MG tablet Take 1 tablet (800 mg total) by mouth every 8 (eight) hours as needed for moderate pain. 15 tablet 0   levofloxacin (LEVAQUIN) 500 MG tablet Take 1 tablet (500 mg total) by mouth daily. Take 1.5 tabs PO on day 1 then 1 tab daily PO x 6 days 7 tablet 0   No current facility-administered medications for this visit.    No Known Allergies  Family History  Problem Relation Age of Onset   Cancer Mother    Cancer Father        unknown type   Hypertension Sister    Hypertension Brother    Hyperlipidemia Brother    Breast cancer Maternal Grandmother        likely breast ca - mastectomy   Hypertension Brother    Diabetes Brother    Hypertension Brother    Heart attack Neg Hx    Stroke Neg Hx    Colon cancer Neg Hx    Ovarian cancer Neg Hx     Social History   Socioeconomic History   Marital status: Married    Spouse name: Not on file   Number of children: Not on file   Years of education: Not on file   Highest education level: Not on file  Occupational History   Not on file  Tobacco Use   Smoking status: Every Day    Packs/day:  0.50    Years: 1.00    Pack years: 0.50    Types: Cigarettes   Smokeless tobacco: Never   Tobacco comments:    age [age] smoked 1/2 ppd, quit x 20 years, now smoking 1 year  Vaping Use   Vaping Use: Never used  Substance and Sexual Activity   Alcohol use: No   Drug use: No   Sexual activity: Never  Other Topics Concern   Not on file  Social History Narrative   Not on file   Social Determinants of Health   Financial Resource Strain: Not on file  Food Insecurity: Not on file  Transportation Needs: Not on file  Physical Activity: Not on file  Stress: Not on file  Social Connections: Not on file  Intimate Partner Violence: Not on file     Constitutional: Denies fever, malaise, fatigue, headache or abrupt weight changes.  Respiratory: Denies difficulty breathing, shortness of breath, cough or sputum production.   Cardiovascular: Pt reports swelling of the LLE. Denies chest pain, chest tightness, palpitations or swelling in the hands.  Musculoskeletal: Denies decrease in  range of motion, difficulty with gait, muscle pain or joint pain and swelling.  Skin: Pt reports redness and warmth of LLE. Denies rashes, lesions or ulcercations.   No other specific complaints in a complete review of systems (except as listed in HPI above).  Objective:   Physical Exam   BP (!) 139/91 (BP Location: Right Arm, Patient Position: Sitting, Cuff Size: Normal)   Pulse 88   Temp (!) 97.1 F (36.2 C) (Temporal)   Resp 18   Ht 5\' 11"  (1.803 m)   Wt 207 lb 9.6 oz (94.2 kg)   SpO2 100%   BMI 28.95 kg/m   Wt Readings from Last 3 Encounters:  05/22/21 205 lb 3.2 oz (93.1 kg)  05/18/21 184 lb 15.5 oz (83.9 kg)  07/21/17 185 lb (83.9 kg)    General: Appears her stated age, overweight, in NAD. Skin: Warm, dry and intact. Redness and warmth noted of the lateral portion of the mid calf. Cardiovascular: Normal rate and rhythm. S1,S2 noted.  No murmur, rubs or gallops noted. 1+ nonpitting RLE  edema. 2+ LLE non pitting edema. Pulmonary/Chest: Normal effort and positive vesicular breath sounds. No respiratory distress. No wheezes, rales or ronchi noted.  Musculoskeletal: No difficulty with gait.  Neurological: Alert and oriented.   BMET    Component Value Date/Time   NA 137 05/18/2021 0747   K 3.5 05/18/2021 0747   CL 104 05/18/2021 0747   CO2 24 05/18/2021 0747   GLUCOSE 104 (H) 05/18/2021 0747   BUN 10 05/18/2021 0747   CREATININE 0.90 05/18/2021 0747   CREATININE 0.75 06/09/2017 0832   CALCIUM 9.0 05/18/2021 0747   GFRNONAA >60 05/18/2021 0747   GFRNONAA 89 06/09/2017 0832   GFRAA >60 04/08/2019 0226   GFRAA 103 06/09/2017 0832    Lipid Panel     Component Value Date/Time   CHOL 169 06/09/2017 0832   TRIG 52 06/09/2017 0832   HDL 63 06/09/2017 0832   CHOLHDL 2.7 06/09/2017 0832   LDLCALC 93 06/09/2017 0832    CBC    Component Value Date/Time   WBC 12.2 (H) 05/18/2021 0747   RBC 4.52 05/18/2021 0747   HGB 12.8 05/18/2021 0747   HCT 38.9 05/18/2021 0747   PLT 249 05/18/2021 0747   MCV 86.1 05/18/2021 0747   MCH 28.3 05/18/2021 0747   MCHC 32.9 05/18/2021 0747   RDW 14.5 05/18/2021 0747   LYMPHSABS 1,410 06/09/2017 0832   EOSABS 291 06/09/2017 0832   BASOSABS 41 06/09/2017 0832    Hgb A1C Lab Results  Component Value Date   HGBA1C 5.0 06/09/2017           Assessment & Plan:   Cellulitis of LLE secondary to CVI:  Encouraged elevation Encouraged warm compresses RX for Keflex 500 mg TID x 10 days Monitor for new or worsening symptoms  Return precautions discussed  08/09/2017, NP This visit occurred during the SARS-CoV-2 public health emergency.  Safety protocols were in place, including screening questions prior to the visit, additional usage of staff PPE, and extensive cleaning of exam room while observing appropriate contact time as indicated for disinfecting solutions.

## 2021-09-10 ENCOUNTER — Encounter: Payer: Self-pay | Admitting: Internal Medicine

## 2021-09-10 ENCOUNTER — Other Ambulatory Visit: Payer: Self-pay

## 2021-09-10 ENCOUNTER — Ambulatory Visit: Payer: Self-pay | Admitting: *Deleted

## 2021-09-10 ENCOUNTER — Ambulatory Visit (INDEPENDENT_AMBULATORY_CARE_PROVIDER_SITE_OTHER): Payer: PRIVATE HEALTH INSURANCE | Admitting: Internal Medicine

## 2021-09-10 VITALS — BP 119/74 | HR 83 | Temp 97.5°F | Resp 18 | Ht 71.0 in | Wt 197.2 lb

## 2021-09-10 DIAGNOSIS — L03115 Cellulitis of right lower limb: Secondary | ICD-10-CM | POA: Diagnosis not present

## 2021-09-10 DIAGNOSIS — I872 Venous insufficiency (chronic) (peripheral): Secondary | ICD-10-CM | POA: Diagnosis not present

## 2021-09-10 MED ORDER — CEPHALEXIN 500 MG PO CAPS: 500.0000 mg | ORAL_CAPSULE | Freq: Three times a day (TID) | ORAL | 0 refills | Status: DC

## 2021-09-10 NOTE — Telephone Encounter (Signed)
  Chief Complaint: right leg red and swollen and sore (history of cellulitis) Symptoms: red, swollen, painful right leg Frequency: constant started Friday Pertinent Negatives: Patient denies No shortness of breath or chest tightness Disposition: [] ED /[] Urgent Care (no appt availability in office) / [x] Appointment(In office/virtual)/ []  [facility]/ [] Home Care/ [] Refused Recommended Disposition  Additional Notes: Appt made with , NP for today at 11:00 in office visit.    Pt thinks also her leg is swelling from eating fish however she does have a history of cellulits.

## 2021-09-10 NOTE — Telephone Encounter (Signed)
Reason for Disposition  [1] Thigh or calf pain AND [2] only 1 side AND [3] present > 1 hour    History of cellulitis in right leg  Answer Assessment - Initial Assessment Questions 1. ONSET: "When did the swelling start?" (e.g., minutes, hours, days)     Pt calling in.   The line got disconnected before agent got her transferred to me.   I called her back. The swelling started Friday because I ate some fish before going to work.  I ended up with cold chills and I had to leave work.   It's hurting now.  I can't step down on it.   It's done this the last 2 times I ate shrimp.   This time I ate fish.   The last 3 times I've had seafood my leg has swollen up.    2. LOCATION: "What part of the leg is swollen?"  "Are both legs swollen or just one leg?"     Right leg is swollen and red.   It's the lower front of leg above my ankle.  No open wounds.   I was given antibiotics when this happened before.  My leg swells up anyway.   When it's up and I put it down it burns with pain.   I have to keep putting my leg up and down to help the blood flow.  I don't have blood flow problems that I know of.   Just has happened the last 3 times I've eaten seafood.   3. SEVERITY: "How bad is the swelling?" (e.g., localized; mild, moderate, severe)  - Localized - small area of swelling localized to one leg  - MILD pedal edema - swelling limited to foot and ankle, pitting edema < 1/4 inch (6 mm) deep, rest and elevation eliminate most or all swelling  - MODERATE edema - swelling of lower leg to knee, pitting edema > 1/4 inch (6 mm) deep, rest and elevation only partially reduce swelling  - SEVERE edema - swelling extends above knee, facial or hand swelling present      It's from inside my leg up top at groin area to inside my leg it's hurting.  It's my whole leg on right side.  No history of blood clots in legs.   Not on blood thinners. 4. REDNESS: "Does the swelling look red or infected?"     It's red at the lower front  part above my ankle. 5. PAIN: "Is the swelling painful to touch?" If Yes, ask: "How painful is it?"   (Scale 1-10; mild, moderate or severe)     It's sore on the front and back of my leg and my ankle and top of my foot.  My foot is not as red as the front of my leg.  6. FEVER: "Do you have a fever?" If Yes, ask: "What is it, how was it measured, and when did it start?"      When it started I had cold chills Friday night right after eating fish. 7. CAUSE: "What do you think is causing the leg swelling?"     It doesn't do this until I eat seafood.   I ate shrimp the last 2 times this happened so I avoid shrimp but this time I ate fish. 8. MEDICAL HISTORY: "Do you have a history of heart failure, kidney disease, liver failure, or cancer?"     No heart or lung problems, no kidney problems.   Does not have diabetes. 9.  RECURRENT SYMPTOM: "Have you had leg swelling before?" If Yes, ask: "When was the last time?" "What happened that time?"     Yes when she ate shrimp on 2 previous occasions.   Last occurrence was 3 months ago approximately. 10. OTHER SYMPTOMS: "Do you have any other symptoms?" (e.g., chest pain, difficulty breathing)       No shortness of breath or chest tightness/pain. 11. PREGNANCY: "Is there any chance you are pregnant?" "When was your last menstrual period?"       N/A  Protocols used: Leg Swelling and Edema-A-AH

## 2021-09-10 NOTE — Patient Instructions (Signed)
Cellulitis, Adult Cellulitis is a skin infection. The infected area is often warm, red, swollen, and sore. It occurs most often in the arms and lower legs. It is very important to get treated for this condition. What are the causes? This condition is caused by bacteria. The bacteria enter through a break in the skin, such as a cut, burn, insect bite, open sore, or crack. What increases the risk? This condition is more likely to occur in people who: Have a weak body defense system (immune system). Have open cuts, burns, bites, or scrapes on the skin. Are older than 60 years of age. Have a blood sugar problem (diabetes). Have a long-lasting (chronic) liver disease (cirrhosis) or kidney disease. Are very overweight (obese). Have a skin problem, such as: Itchy rash (eczema). Slow movement of blood in the veins (venous stasis). Fluid buildup below the skin (edema). Have been treated with high-energy rays (radiation). Use IV drugs. What are the signs or symptoms? Symptoms of this condition include: Skin that is: Red. Streaking. Spotting. Swollen. Sore or painful when you touch it. Warm. A fever. Chills. Blisters. How is this diagnosed? This condition is diagnosed based on: Medical history. Physical exam. Blood tests. Imaging tests. How is this treated? Treatment for this condition may include: Medicines to treat infections or allergies. Home care, such as: Rest. Placing cold or warm cloths (compresses) on the skin. Hospital care, if the condition is very bad. Follow these instructions at home: Medicines Take over-the-counter and prescription medicines only as told by your doctor. If you were prescribed an antibiotic medicine, take it as told by your doctor. Do not stop taking it even if you start to feel better. General instructions  Drink enough fluid to keep your pee (urine) pale yellow. Do not touch or rub the infected area. Raise (elevate) the infected area above the  level of your heart while you are sitting or lying down. Place cold or warm cloths on the area as told by your doctor. Keep all follow-up visits as told by your doctor. This is important. Contact a doctor if: You have a fever. You do not start to get better after 1-2 days of treatment. Your bone or joint under the infected area starts to hurt after the skin has healed. Your infection comes back. This can happen in the same area or another area. You have a swollen bump in the area. You have new symptoms. You feel ill and have muscle aches and pains. Get help right away if: Your symptoms get worse. You feel very sleepy. You throw up (vomit) or have watery poop (diarrhea) for a long time. You see red streaks coming from the area. Your red area gets larger. Your red area turns dark in color. These symptoms may represent a serious problem that is an emergency. Do not wait to see if the symptoms will go away. Get medical help right away. Call your local emergency services (911 in the [place] Do not drive yourself to the hospital. Summary Cellulitis is a skin infection. The area is often warm, red, swollen, and sore. This condition is treated with medicines, rest, and cold and warm cloths. Take all medicines only as told by your doctor. Tell your doctor if symptoms do not start to get better after 1-2 days of treatment. This information is not intended to replace advice given to you by your health care provider. Make sure you discuss any questions you have with your health care provider. Document Revised: 02/05/2018 Document Reviewed:   02/05/2018 Elsevier Patient Education  2022 [employer]  

## 2021-09-10 NOTE — Progress Notes (Signed)
Subjective:    Patient ID: Amber Figueroa, female    DOB: Sep 18, 1961, 60 y.o.   MRN: [id number]  HPI  Pt presents to the clinic today with c/o right lower extremity redness, swelling and discomfort. She reports this started 3 days ago. She describes the pain as a burning sensation that starts in her right groin and radiates down her right leg. She denies any injury or insect bite that she is aware of. She denies fever, chills, nausea or vomiting. She has not taken anything OTC for this. She has a history of venous insufficiency with recurrent cellulitis.  Review of Systems     Past Medical History:  Diagnosis Date   Back pain     Current Outpatient Medications  Medication Sig Dispense Refill   ibuprofen (ADVIL) 800 MG tablet Take 1 tablet (800 mg total) by mouth every 8 (eight) hours as needed for moderate pain. 15 tablet 0   No current facility-administered medications for this visit.    No Known Allergies  Family History  Problem Relation Age of Onset   Cancer Mother    Cancer Father        unknown type   Hypertension Sister    Hypertension Brother    Hyperlipidemia Brother    Breast cancer Maternal Grandmother        likely breast ca - mastectomy   Hypertension Brother    Diabetes Brother    Hypertension Brother    Heart attack Neg Hx    Stroke Neg Hx    Colon cancer Neg Hx    Ovarian cancer Neg Hx     Social History   Socioeconomic History   Marital status: Married    Spouse name: Not on file   Number of children: Not on file   Years of education: Not on file   Highest education level: Not on file  Occupational History   Not on file  Tobacco Use   Smoking status: Every Day    Packs/day: 0.50    Years: 1.00    Pack years: 0.50    Types: Cigarettes   Smokeless tobacco: Never   Tobacco comments:    age [age] smoked 1/2 ppd, quit x 20 years, now smoking 1 year  Vaping Use   Vaping Use: Never used  Substance and Sexual Activity   Alcohol use: No    Drug use: No   Sexual activity: Never  Other Topics Concern   Not on file  Social History Narrative   Not on file   Social Determinants of Health   Financial Resource Strain: Not on file  Food Insecurity: Not on file  Transportation Needs: Not on file  Physical Activity: Not on file  Stress: Not on file  Social Connections: Not on file  Intimate Partner Violence: Not on file     Constitutional: Denies fever, malaise, fatigue, headache or abrupt weight changes.  Respiratory: Denies difficulty breathing, shortness of breath, cough or sputum production.   Cardiovascular: Denies chest pain, chest tightness, palpitations or swelling in the hands or feet.  Musculoskeletal: Pt reports right leg pain. Denies decrease in range of motion, difficulty with gait, muscle pain or joint.  Skin: Pt reports redness and swelling of the right lower extremity. Denies rashes, lesions or ulcercations.   No other specific complaints in a complete review of systems (except as listed in HPI above).  Objective:   Physical Exam   BP 119/74 (BP Location: Right Arm, Patient Position: Sitting, Cuff Size:  Large)   Pulse 83   Temp (!) 97.5 F (36.4 C) (Temporal)   Resp 18   Ht 5\' 11"  (1.803 m)   Wt 197 lb 3.2 oz (89.4 kg)   SpO2 100%   BMI 27.50 kg/m  Wt Readings from Last 3 Encounters:  09/10/21 197 lb 3.2 oz (89.4 kg)  07/04/21 207 lb 9.6 oz (94.2 kg)  05/22/21 205 lb 3.2 oz (93.1 kg)    General: Appears her stated age, overweight, in NAD. Skin: Warm, dry and intact. Area of redness, warmth and tenderness overlying the right anterior shin. Cardiovascular: Normal rate and rhythm. 1+ BLE edema. Pulmonary/Chest: Normal effort and positive vesicular breath sounds. No respiratory distress. No wheezes, rales or ronchi noted.  Musculoskeletal:  No difficulty with gait.  Neurological: Alert and oriented.  BMET    Component Value Date/Time   NA 137 05/18/2021 0747   K 3.5 05/18/2021 0747   CL 104  05/18/2021 0747   CO2 24 05/18/2021 0747   GLUCOSE 104 (H) 05/18/2021 0747   BUN 10 05/18/2021 0747   CREATININE 0.90 05/18/2021 0747   CREATININE 0.75 06/09/2017 0832   CALCIUM 9.0 05/18/2021 0747   GFRNONAA >60 05/18/2021 0747   GFRNONAA 89 06/09/2017 0832   GFRAA >60 04/08/2019 0226   GFRAA 103 06/09/2017 0832    Lipid Panel     Component Value Date/Time   CHOL 169 06/09/2017 0832   TRIG 52 06/09/2017 0832   HDL 63 06/09/2017 0832   CHOLHDL 2.7 06/09/2017 0832   LDLCALC 93 06/09/2017 0832    CBC    Component Value Date/Time   WBC 12.2 (H) 05/18/2021 0747   RBC 4.52 05/18/2021 0747   HGB 12.8 05/18/2021 0747   HCT 38.9 05/18/2021 0747   PLT 249 05/18/2021 0747   MCV 86.1 05/18/2021 0747   MCH 28.3 05/18/2021 0747   MCHC 32.9 05/18/2021 0747   RDW 14.5 05/18/2021 0747   LYMPHSABS 1,410 06/09/2017 0832   EOSABS 291 06/09/2017 0832   BASOSABS 41 06/09/2017 0832    Hgb A1C Lab Results  Component Value Date   HGBA1C 5.0 06/09/2017           Assessment & Plan:   Cellulitis of RLE, CVI  Encouraged elevation Encouraged warm compresses for 10 minutes, 3 x day RX for Keflex 500 mg TID x 10 days  Return precautions discussed  08/09/2017, NP This visit occurred during the SARS-CoV-2 public health emergency.  Safety protocols were in place, including screening questions prior to the visit, additional usage of staff PPE, and extensive cleaning of exam room while observing appropriate contact time as indicated for disinfecting solutions.

## 2021-12-03 ENCOUNTER — Encounter: Payer: Self-pay | Admitting: Internal Medicine

## 2021-12-03 ENCOUNTER — Inpatient Hospital Stay
Admission: EM | Admit: 2021-12-03 | Discharge: 2021-12-05 | DRG: 872 | Disposition: A | Discharge status: Home / Self Care | Payer: PRIVATE HEALTH INSURANCE | Attending: Internal Medicine | Admitting: Internal Medicine

## 2021-12-03 ENCOUNTER — Inpatient Hospital Stay: Payer: PRIVATE HEALTH INSURANCE

## 2021-12-03 ENCOUNTER — Emergency Department: Payer: PRIVATE HEALTH INSURANCE

## 2021-12-03 ENCOUNTER — Other Ambulatory Visit: Payer: Self-pay

## 2021-12-03 DIAGNOSIS — Z83438 Family history of other disorder of lipoprotein metabolism and other lipidemia: Secondary | ICD-10-CM

## 2021-12-03 DIAGNOSIS — Z833 Family history of diabetes mellitus: Secondary | ICD-10-CM

## 2021-12-03 DIAGNOSIS — D62 Acute posthemorrhagic anemia: Secondary | ICD-10-CM | POA: Diagnosis present

## 2021-12-03 DIAGNOSIS — E876 Hypokalemia: Secondary | ICD-10-CM

## 2021-12-03 DIAGNOSIS — F17200 Nicotine dependence, unspecified, uncomplicated: Secondary | ICD-10-CM | POA: Diagnosis present

## 2021-12-03 DIAGNOSIS — M79604 Pain in right leg: Secondary | ICD-10-CM | POA: Diagnosis present

## 2021-12-03 DIAGNOSIS — L0390 Cellulitis, unspecified: Secondary | ICD-10-CM | POA: Diagnosis not present

## 2021-12-03 DIAGNOSIS — L03119 Cellulitis of unspecified part of limb: Secondary | ICD-10-CM

## 2021-12-03 DIAGNOSIS — L03115 Cellulitis of right lower limb: Secondary | ICD-10-CM | POA: Diagnosis present

## 2021-12-03 DIAGNOSIS — Z20822 Contact with and (suspected) exposure to covid-19: Secondary | ICD-10-CM | POA: Diagnosis present

## 2021-12-03 DIAGNOSIS — A419 Sepsis, unspecified organism: Principal | ICD-10-CM | POA: Diagnosis present

## 2021-12-03 DIAGNOSIS — Z803 Family history of malignant neoplasm of breast: Secondary | ICD-10-CM

## 2021-12-03 DIAGNOSIS — F17210 Nicotine dependence, cigarettes, uncomplicated: Secondary | ICD-10-CM | POA: Diagnosis present

## 2021-12-03 DIAGNOSIS — Z8249 Family history of ischemic heart disease and other diseases of the circulatory system: Secondary | ICD-10-CM

## 2021-12-03 DIAGNOSIS — D649 Anemia, unspecified: Secondary | ICD-10-CM

## 2021-12-03 LAB — COMPREHENSIVE METABOLIC PANEL
ALT: 12 U/L (ref 0–44)
AST: 22 U/L (ref 15–41)
Albumin: 3.7 g/dL (ref 3.5–5.0)
Alkaline Phosphatase: 64 U/L (ref 38–126)
Anion gap: 9 (ref 5–15)
BUN: 14 mg/dL (ref 8–23)
CO2: 25 mmol/L (ref 22–32)
Calcium: 9.1 mg/dL (ref 8.9–10.3)
Chloride: 106 mmol/L (ref 98–111)
Creatinine, Ser: 0.97 mg/dL (ref 0.44–1.00)
GFR, Estimated: >60 mL/min (ref >60)
Glucose, Bld: 141 mg/dL — ABNORMAL HIGH (ref 70–99)
Potassium: 3.3 mmol/L — ABNORMAL LOW (ref 3.5–5.1)
Sodium: 140 mmol/L (ref 135–145)
Total Bilirubin: 0.9 mg/dL (ref 0.3–1.2)
Total Protein: 7.4 g/dL (ref 6.5–8.1)

## 2021-12-03 LAB — CBC
HCT: 38.8 % (ref 36.0–46.0)
Hemoglobin: 12.5 g/dL (ref 12.0–15.0)
MCH: 27.8 pg (ref 26.0–34.0)
MCHC: 32.2 g/dL (ref 30.0–36.0)
MCV: 86.4 fL (ref 80.0–100.0)
Platelets: 231 10*3/uL (ref 150–400)
RBC: 4.49 MIL/uL (ref 3.87–5.11)
RDW: 15.4 % (ref 11.5–15.5)
WBC: 12.3 10*3/uL — ABNORMAL HIGH (ref 4.0–10.5)
nRBC: 0 % (ref 0.0–0.2)

## 2021-12-03 LAB — APTT: aPTT: 29 seconds (ref 24–36)

## 2021-12-03 LAB — RESP PANEL BY RT-PCR (FLU A&B, COVID) ARPGX2
Influenza A by PCR: NEGATIVE
Influenza B by PCR: NEGATIVE
SARS Coronavirus 2 by RT PCR: NEGATIVE

## 2021-12-03 LAB — MAGNESIUM: Magnesium: 1.8 mg/dL (ref 1.7–2.4)

## 2021-12-03 LAB — PROTIME-INR
INR: 1.1 (ref 0.8–1.2)
Prothrombin Time: 14 seconds (ref 11.4–15.2)

## 2021-12-03 LAB — LACTIC ACID, PLASMA: Lactic Acid, Venous: 1.6 mmol/L (ref 0.5–1.9)

## 2021-12-03 MED ORDER — SODIUM CHLORIDE 0.9 % IV SOLN
250.0000 mL | INTRAVENOUS | Status: DC | PRN
  Administered 2021-12-04: 250 mL via INTRAVENOUS

## 2021-12-03 MED ORDER — ACETAMINOPHEN 325 MG PO TABS: 650.0000 mg | ORAL_TABLET | Freq: Four times a day (QID) | ORAL | Status: DC | PRN

## 2021-12-03 MED ORDER — ONDANSETRON HCL 4 MG PO TABS
4.0000 mg | ORAL_TABLET | Freq: Four times a day (QID) | ORAL | Status: DC | PRN
Start: 2021-12-03 — End: 2021-12-05

## 2021-12-03 MED ORDER — SODIUM CHLORIDE 0.9 % IV SOLN
1.0000 g | Freq: Once | INTRAVENOUS | Status: AC
  Administered 2021-12-03: 1 g via INTRAVENOUS
  Filled 2021-12-03: qty 10

## 2021-12-03 MED ORDER — ONDANSETRON HCL 4 MG/2ML IJ SOLN
4.0000 mg | Freq: Once | INTRAMUSCULAR | Status: AC
  Administered 2021-12-03: 4 mg via INTRAVENOUS
  Filled 2021-12-03: qty 2

## 2021-12-03 MED ORDER — SODIUM CHLORIDE 0.9% FLUSH: 3.0000 mL | INTRAVENOUS | Status: DC | PRN

## 2021-12-03 MED ORDER — ENOXAPARIN SODIUM 40 MG/0.4ML IJ SOSY
40.0000 mg | PREFILLED_SYRINGE | INTRAMUSCULAR | Status: DC
  Filled 2021-12-03: qty 0.4

## 2021-12-03 MED ORDER — OXYCODONE HCL 5 MG PO TABS: 5.0000 mg | ORAL_TABLET | ORAL | Status: DC | PRN

## 2021-12-03 MED ORDER — LACTATED RINGERS IV SOLN: INTRAVENOUS | Status: AC

## 2021-12-03 MED ORDER — ACETAMINOPHEN 650 MG RE SUPP: 650.0000 mg | Freq: Four times a day (QID) | RECTAL | Status: DC | PRN

## 2021-12-03 MED ORDER — SODIUM CHLORIDE 0.9% FLUSH
3.0000 mL | Freq: Two times a day (BID) | INTRAVENOUS | Status: DC
  Administered 2021-12-03 – 2021-12-05 (×3): 3 mL via INTRAVENOUS

## 2021-12-03 MED ORDER — ONDANSETRON HCL 4 MG/2ML IJ SOLN
4.0000 mg | Freq: Four times a day (QID) | INTRAMUSCULAR | Status: DC | PRN
Start: 2021-12-03 — End: 2021-12-05

## 2021-12-03 MED ORDER — POTASSIUM CHLORIDE CRYS ER 20 MEQ PO TBCR
40.0000 meq | EXTENDED_RELEASE_TABLET | Freq: Once | ORAL | Status: AC
  Administered 2021-12-03: 40 meq via ORAL
  Filled 2021-12-03: qty 2

## 2021-12-03 MED ORDER — MORPHINE SULFATE (PF) 4 MG/ML IV SOLN
4.0000 mg | Freq: Once | INTRAVENOUS | Status: AC
  Administered 2021-12-03: 4 mg via INTRAVENOUS
  Filled 2021-12-03: qty 1

## 2021-12-03 MED ORDER — ACETAMINOPHEN 500 MG PO TABS
1000.0000 mg | ORAL_TABLET | Freq: Once | ORAL | Status: AC
  Administered 2021-12-03: 1000 mg via ORAL
  Filled 2021-12-03: qty 2

## 2021-12-03 MED ORDER — CEFAZOLIN SODIUM-DEXTROSE 1-4 GM/50ML-% IV SOLN
1.0000 g | Freq: Three times a day (TID) | INTRAVENOUS | Status: DC
  Administered 2021-12-03 – 2021-12-05 (×7): 1 g via INTRAVENOUS
  Filled 2021-12-03 (×8): qty 50

## 2021-12-03 NOTE — ED Notes (Signed)
A&O x 4. No complaints. No signs of distress. Side rails up. Call bell in reach.  ?

## 2021-12-03 NOTE — ED Triage Notes (Signed)
Edema and redness to right lower leg. Sudden Onset last night.Pt states pain starts in groin and radiates down entire leg. LLE warm to touch.  ?

## 2021-12-03 NOTE — ED Provider Notes (Signed)
? ?[facility] ?Provider Note ? ? ? Event Date/Time  ? First MD Initiated Contact with Patient 12/03/21 [phone]   ?  (approximate) ? ?History  ? ?Chief Complaint: Leg Pain ? ?HPI ? ?Amber Figueroa is a 60 y.o. female with no significant past medical history who presents to the emergency department for right leg pain.  According to the patient she noted yesterday that the right leg has become swollen, states is very painful today as well so she came to the emergency department.  Patient states chills but has not measured a temperature.  Denies any nausea vomiting or diarrhea.  Denies any abdominal pain.  Denies any shortness of breath or chest pain. ? ?Physical Exam  ? ?Triage Vital Signs: ?ED Triage Vitals  ?Enc Vitals Group  ?   BP 12/03/21 0614 (!) 135/100  ?   Pulse Rate 12/03/21 0614 (!) 115  ?   Resp 12/03/21 0614 19  ?   Temp 12/03/21 0614 100.3 ?F (37.9 ?C)  ?   Temp src --   ?   SpO2 12/03/21 0614 92 %  ?   Weight 12/03/21 0613 180 lb (81.6 kg)  ?   Height 12/03/21 0613 5\' 11"  (1.803 m)  ?   Head Circumference --   ?   Peak Flow --   ?   Pain Score 12/03/21 0613 6  ?   Pain Loc --   ?   Pain Edu? --   ?   Excl. in GC? --   ? ? ?Most recent vital signs: ?Vitals:  ? 12/03/21 0614  ?BP: (!) 135/100  ?Pulse: (!) 115  ?Resp: 19  ?Temp: 100.3 ?F (37.9 ?C)  ?SpO2: 92%  ? ? ?General: Awake, no distress.  ?CV:  Good peripheral perfusion.  Regular rate and rhythm  ?Resp:  Normal effort.  Equal breath sounds bilaterally.  ?Abd:  No distention.  Soft, nontender.  No rebound or guarding. ?Other:  1-2+ pitting edema to bilateral lower extremities somewhat worse in the right lower extremity.  Right lower extremity is also erythematous and moderately tender to palpation below the knee and distally. ? ? ?ED Results / Procedures / Treatments  ? ?RADIOLOGY ? ?Ultrasound pending ? ? ?MEDICATIONS ORDERED IN ED: ?Medications  ?acetaminophen (TYLENOL) tablet 1,000 mg (has no administration in time range)   ?morphine (PF) 4 MG/ML injection 4 mg (has no administration in time range)  ?ondansetron (ZOFRAN) injection 4 mg (has no administration in time range)  ? ? ? ?IMPRESSION / MDM / ASSESSMENT AND PLAN / ED COURSE  ?I reviewed the triage vital signs and the nursing notes. ? ?Patient presents to the emergency department for right leg pain swelling and redness.  Examination is consistent with cellulitis versus DVT.  Patient is febrile to 100.3 and she is mildly tachycardic.  We will check labs including lactic acid and blood cultures.  We will obtain an ultrasound.  Given the fever we will cover with IV antibiotics until labs and ultrasound results are known.  We will treat pain while awaiting results.  Patient is agreeable to plan of care. ? ?CBC is resulted showing slight leukocytosis of 12,000.  Lactic acid of 1.6.  Chemistry largely reassuring. ? ?Patient care signed out to oncoming provider.  Ultrasound pending. ? ?FINAL CLINICAL IMPRESSION(S) / ED DIAGNOSES  ? ?Right lower extremity pain and swelling ? ? ?Note:  This document was prepared using Dragon voice recognition software and may include unintentional dictation errors. ?  ?  Minna Antis, MD ?12/03/21 [phone] ? ?

## 2021-12-03 NOTE — Assessment & Plan Note (Addendum)
Sepsis secondary to cellulitis as evidenced by Tmax of 100.3, tachycardia, leukocytosis and right lower extremity cellulitis ?Patient received IV fluids, treated with cefazolin.  Condition much improved, swelling has come down.  Tenderness much better.  At this point, patient is medically stable to be discharged, will continue oral antibiotic with Augmentin for 5 days.  Blood culture came back as negative. ?

## 2021-12-03 NOTE — Assessment & Plan Note (Addendum)
Smoking cessation advised.

## 2021-12-03 NOTE — Plan of Care (Signed)
?  Problem: Clinical Measurements: ?Goal: Ability to avoid or minimize complications of infection will improve ?Outcome: Progressing ?  ?Problem: Skin Integrity: ?Goal: Skin integrity will improve ?Outcome: Progressing ?  ?Problem: Education: ?Goal: Knowledge of General Education information will improve ?Description: Including pain rating scale, medication(s)/side effects and non-pharmacologic comfort measures ?Outcome: Progressing ?  ?Problem: Health Behavior/Discharge Planning: ?Goal: Ability to manage health-related needs will improve ?Outcome: Progressing ?  ?Problem: Clinical Measurements: ?Goal: Ability to maintain clinical measurements within normal limits will improve ?Outcome: Progressing ?Goal: Cardiovascular complication will be avoided ?Outcome: Progressing ?  ?Problem: Activity: ?Goal: Risk for activity intolerance will decrease ?Outcome: Progressing ?  ?Problem: Elimination: ?Goal: Will not experience complications related to bowel motility ?Outcome: Progressing ?Goal: Will not experience complications related to urinary retention ?Outcome: Progressing ?  ?Problem: Pain Managment: ?Goal: General experience of comfort will improve ?Outcome: Progressing ?  ?Problem: Safety: ?Goal: Ability to remain free from injury will improve ?Outcome: Progressing ?  ?Problem: Skin Integrity: ?Goal: Risk for impaired skin integrity will decrease ?Outcome: Progressing ?  ?Problem: Clinical Measurements: ?Goal: Respiratory complications will improve ?Outcome: Completed/Met ?  ?Problem: Nutrition: ?Goal: Adequate nutrition will be maintained ?Outcome: Completed/Met ?  ?Problem: Coping: ?Goal: Level of anxiety will decrease ?Outcome: Completed/Met ?  ?

## 2021-12-03 NOTE — H&P (Signed)
?History and Physical  ? ? ?Patient: Amber Figueroa FEO:[id number] DOB: Sep 20, 1961 ?DOA: 12/03/2021 ?DOS: the patient was seen and examined on 12/03/2021 ?PCP: Lorre Munroe, NP  ?Patient coming from: Home ? ?Chief Complaint:  ?Chief Complaint  ?Patient presents with  ? Leg Pain  ? ?HPI: Amber Figueroa is a 61 y.o. female with medical history significant for nicotine dependence, hypertension who presents for evaluation of right leg pain and swelling extending from the dorsum of the right foot to the right knee. Right leg pain and swelling is associated with chills and fever. ?She denies having any chest pain, no shortness of breath, no nausea, no vomiting, no changes in her bowel habits, no dizziness, no lightheadedness, no focal deficits or blurred vision. ?Upon arrival to the ER she was noted to have a fever with a Tmax of 100.3, tachycardic with heart rate of 115 and leukocytosis. ?She received a dose of IV Rocephin in the ER and will be admitted to the hospital for further evaluation ? ?Review of Systems: As mentioned in the history of present illness. All other systems reviewed and are negative. ?Past Medical History:  ?Diagnosis Date  ? Back pain   ? ?Past Surgical History:  ?Procedure Laterality Date  ? COLONOSCOPY WITH PROPOFOL N/A 07/21/2017  ? Procedure: COLONOSCOPY WITH PROPOFOL;  Surgeon: Wyline Mood, MD;  Location: [facility] ENDOSCOPY;  Service: Gastroenterology;  Laterality: N/A;  ? KNEE SURGERY    ? ?Social History:  reports that she has been smoking cigarettes. She has a 0.50 pack-year smoking history. She has never used smokeless tobacco. She reports that she does not drink alcohol and does not use drugs. ? ?No Known Allergies ? ?Family History  ?Problem Relation Age of Onset  ? Cancer Mother   ? Cancer Father   ?     unknown type  ? Hypertension Sister   ? Hypertension Brother   ? Hyperlipidemia Brother   ? Breast cancer Maternal Grandmother   ?     likely breast ca - mastectomy  ? Hypertension Brother   ?  Diabetes Brother   ? Hypertension Brother   ? Heart attack Neg Hx   ? Stroke Neg Hx   ? Colon cancer Neg Hx   ? Ovarian cancer Neg Hx   ? ? ?Prior to Admission medications   ?Medication Sig Start Date End Date Taking? Authorizing Provider  ?cephALEXin (KEFLEX) 500 MG capsule Take 1 capsule (500 mg total) by mouth 3 (three) times daily. 09/10/21   Lorre Munroe, NP  ?ibuprofen (ADVIL) 800 MG tablet Take 1 tablet (800 mg total) by mouth every 8 (eight) hours as needed for moderate pain. 04/08/19   Irean Hong, MD  ? ? ?Physical Exam: ?Vitals:  ? 12/03/21 3254 12/03/21 9826 12/03/21 0933  ?BP:  (!) 135/100 (!) 121/59  ?Pulse:  (!) 115 89  ?Resp:  19 14  ?Temp:  100.3 ?F (37.9 ?C) 98.4 ?F (36.9 ?C)  ?TempSrc:   Oral  ?SpO2:  92% 97%  ?Weight: 81.6 kg    ?Height: 5\' 11"  (1.803 m)    ? ?Physical Exam ?Vitals and nursing note reviewed.  ?Constitutional:   ?   Appearance: She is normal weight. She is ill-appearing.  ?HENT:  ?   Head: Normocephalic and atraumatic.  ?   Nose: Nose normal.  ?   Mouth/Throat:  ?   Mouth: Mucous membranes are moist.  ?Eyes:  ?   Pupils: Pupils are equal, round, and reactive to  light.  ?Cardiovascular:  ?   Rate and Rhythm: Tachycardia present.  ?Pulmonary:  ?   Effort: Pulmonary effort is normal.  ?   Breath sounds: Normal breath sounds.  ?Abdominal:  ?   General: Abdomen is flat. Bowel sounds are normal.  ?   Palpations: Abdomen is soft.  ?Musculoskeletal:     ?   General: Swelling and tenderness present.  ?   Cervical back: Normal range of motion and neck supple.  ?   Comments: Redness involving the right leg  ?Skin: ?   General: Skin is warm and dry.  ?Neurological:  ?   General: No focal deficit present.  ?   Mental Status: She is alert and oriented to person, place, and time.  ?Psychiatric:     ?   Mood and Affect: Mood normal.     ?   Behavior: Behavior normal.  ? ? ?Data Reviewed: ?Relevant notes from primary care and specialist visits, past discharge summaries as available in EHR,  including Care Everywhere. ?Prior diagnostic testing as pertinent to current admission diagnoses ?Updated medications and problem lists for reconciliation ?ED course, including vitals, labs, imaging, treatment and response to treatment ?Triage notes, nursing and pharmacy notes and ED provider's notes ?Notable results as noted in HPI ?Labs reviewed potassium 3.3, white count 12.3, glucose 141 ?Right lower extremity venous Doppler shows there is no evidence of deep venous thrombosis in the right lower extremity. ?There are no new results to review at this time. ? ?Assessment and Plan: ?* Sepsis due to cellulitis [facility]) ?Sepsis secondary to cellulitis as evidenced by Tmax of 100.3, tachycardia, leukocytosis and right lower extremity cellulitis ?Aggressive IV fluid resuscitation ?Supportive care with pain medication and antipyretics ?Place patient on cefazolin 1 g IV every 8 hours ?Follow-up results of blood cultures ? ?Nicotine dependence ?Smoking cessation has been discussed with patient in detail ?Patient declines a nicotine transdermal patch at this time ? ? ? ? ? Advance Care Planning:   Code Status: Full Code  ? ?Consults: None ? ?Family Communication: Greater than 50% of time was spent discussing patient's condition and plan of care with patient at the bedside.  All questions and concerns have been addressed.  She verbalizes understanding and agrees with the plan. ? ?Severity of Illness: ?The appropriate patient status for this patient is INPATIENT. Inpatient status is judged to be reasonable and necessary in order to provide the required intensity of service to ensure the patient's safety. The patient's presenting symptoms, physical exam findings, and initial radiographic and laboratory data in the context of their chronic comorbidities is felt to place them at high risk for further clinical deterioration. Furthermore, it is not anticipated that the patient will be medically stable for discharge from the hospital  within 2 midnights of admission.  ? ?* I certify that at the point of admission it is my clinical judgment that the patient will require inpatient hospital care spanning beyond 2 midnights from the point of admission due to high intensity of service, high risk for further deterioration and high frequency of surveillance required.* ? ?Author: ?Lucile Shutters, MD ?12/03/2021 9:53 AM ? ?For on call review [email or web address].  ?

## 2021-12-03 NOTE — ED Notes (Signed)
Resting with eyes closed. No signs of distress. No complaints 

## 2021-12-04 DIAGNOSIS — A419 Sepsis, unspecified organism: Secondary | ICD-10-CM | POA: Diagnosis not present

## 2021-12-04 LAB — BASIC METABOLIC PANEL
Anion gap: 6 (ref 5–15)
BUN: 13 mg/dL (ref 8–23)
CO2: 26 mmol/L (ref 22–32)
Calcium: 8.5 mg/dL — ABNORMAL LOW (ref 8.9–10.3)
Chloride: 107 mmol/L (ref 98–111)
Creatinine, Ser: 0.83 mg/dL (ref 0.44–1.00)
GFR, Estimated: >60 mL/min (ref >60)
Glucose, Bld: 103 mg/dL — ABNORMAL HIGH (ref 70–99)
Potassium: 3.4 mmol/L — ABNORMAL LOW (ref 3.5–5.1)
Sodium: 139 mmol/L (ref 135–145)

## 2021-12-04 LAB — CBC
HCT: 32.3 % — ABNORMAL LOW (ref 36.0–46.0)
Hemoglobin: 10.3 g/dL — ABNORMAL LOW (ref 12.0–15.0)
MCH: 27.6 pg (ref 26.0–34.0)
MCHC: 31.9 g/dL (ref 30.0–36.0)
MCV: 86.6 fL (ref 80.0–100.0)
Platelets: 205 10*3/uL (ref 150–400)
RBC: 3.73 MIL/uL — ABNORMAL LOW (ref 3.87–5.11)
RDW: 15.4 % (ref 11.5–15.5)
WBC: 13.1 10*3/uL — ABNORMAL HIGH (ref 4.0–10.5)
nRBC: 0 % (ref 0.0–0.2)

## 2021-12-04 LAB — LACTIC ACID, PLASMA: Lactic Acid, Venous: 0.8 mmol/L (ref 0.5–1.9)

## 2021-12-04 LAB — HIV ANTIBODY (ROUTINE TESTING W REFLEX): HIV Screen 4th Generation wRfx: NONREACTIVE

## 2021-12-04 MED ORDER — POTASSIUM CHLORIDE CRYS ER 20 MEQ PO TBCR
40.0000 meq | EXTENDED_RELEASE_TABLET | Freq: Once | ORAL | Status: DC
  Filled 2021-12-04: qty 2

## 2021-12-04 NOTE — Progress Notes (Signed)
PROGRESS NOTE  Amber Figueroa [id number] DOB: [birth year] DOA: 12/03/2021 PCP: Jearld Fenton, NP  HPI/Recap of past 24 hours:  Amber Figueroa is a 61 y.o. female with medical history significant for nicotine dependence, hypertension who presents for evaluation of right leg pain, redness, and swelling extending from the dorsum of the right foot to the right knee.  Work-up revealed sepsis secondary to right lower extremity cellulitis.  She was started on IV antibiotics empirically, blood cultures were obtained.  12/04/2021: Patient was seen and examined at her bedside this morning.  Reports persistent tenderness, erythema, at her right lower extremity.  Ongoing management of her cellulitis.  Blood cultures negative to date.  WBC is uptrending to 13,000 from 12,000.  Assessment/Plan: Principal Problem:   Sepsis due to cellulitis [facility]) Active Problems:   Nicotine dependence  Sepsis due to cellulitis ([place]) Sepsis secondary to cellulitis as evidenced by tachycardia, leukocytosis and right lower extremity cellulitis WBC is uptrending Blood cultures negative to date. Continue IV antibiotics empirically cefazolin 1 g 3 times daily Obtain MRSA screening test. Continue to monitor fever curve and WBC.  Hypokalemia Serum potassium 3.4 Serum magnesium 1.8 Repleted orally Repeat BMP in the morning, magnesium level.  Acute blood loss anemia Hemoglobin 10.3 with no overt bleeding Repeat CBC in the morning   Nicotine dependence Smoking cessation has been discussed with patient in detail Continue to encourage tobacco cessation. Patient declines a nicotine transdermal patch at this time          Advance Care Planning:   Code Status: Full Code    Consults: None   Family Communication: None at bedside.      DVT prophylaxis: Subcu Lovenox daily  Status is: Inpatient Patient requires at least 2 midnights for further evaluation and treatment of present  condition.    Objective: Vitals:   12/03/21 2344 12/04/21 0414 12/04/21 0801 12/04/21 1135  BP: 124/61 (!) 125/59 132/80 (!) 148/87  Pulse: 89 77 80 77  Resp: 17 16 15 16   Temp: 98.2 F (36.8 C) 98.3 F (36.8 C) 98.6 F (37 C) 98.5 F (36.9 C)  TempSrc:      SpO2: 95% 100% 99% 97%  Weight:      Height:        Intake/Output Summary (Last 24 hours) at 12/04/2021 1539 Last data filed at 12/04/2021 1450 Gross per 24 hour  Intake 600 ml  Output --  Net 600 ml   Filed Weights   12/03/21 0613  Weight: 81.6 kg    Exam:  General: 61 y.o. year-old female well developed well nourished in no acute distress.  Alert and oriented x3. Cardiovascular: Regular rate and rhythm with no rubs or gallops.  No thyromegaly or JVD noted.   Respiratory: Clear to auscultation with no wheezes or rales. Good inspiratory effort. Abdomen: Soft nontender nondistended with normal bowel sounds x4 quadrants. Musculoskeletal: Trace lower extremity edema bilaterally. Skin: Right lower extremity erythema, edema, tenderness about minimal palpation. Psychiatry: Mood is appropriate for condition and setting   Data Reviewed: CBC: Recent Labs  Lab 12/03/21 0622 12/04/21 0414  WBC 12.3* 13.1*  HGB 12.5 10.3*  HCT 38.8 32.3*  MCV 86.4 86.6  PLT 231 [id number]   Basic Metabolic Panel: Recent Labs  Lab 12/03/21 0622 12/04/21 0414  NA 140 139  K 3.3* 3.4*  CL 106 107  CO2 25 26  GLUCOSE 141* 103*  BUN 14 13  CREATININE 0.97 0.83  CALCIUM 9.1 8.5*  MG 1.8  --  GFR: Estimated Creatinine Clearance: 79.6 mL/min (by C-G formula based on SCr of 0.83 mg/dL). Liver Function Tests: Recent Labs  Lab 12/03/21 0622  AST 22  ALT 12  ALKPHOS 64  BILITOT 0.9  PROT 7.4  ALBUMIN 3.7   No results for input(s): LIPASE, AMYLASE in the last 168 hours. No results for input(s): AMMONIA in the last 168 hours. Coagulation Profile: Recent Labs  Lab 12/03/21 0635  INR 1.1   Cardiac Enzymes: No results for  input(s): CKTOTAL, CKMB, CKMBINDEX, TROPONINI in the last 168 hours. BNP (last 3 results) No results for input(s): PROBNP in the last 8760 hours. HbA1C: No results for input(s): HGBA1C in the last 72 hours. CBG: No results for input(s): GLUCAP in the last 168 hours. Lipid Profile: No results for input(s): CHOL, HDL, LDLCALC, TRIG, CHOLHDL, LDLDIRECT in the last 72 hours. Thyroid Function Tests: No results for input(s): TSH, T4TOTAL, FREET4, T3FREE, THYROIDAB in the last 72 hours. Anemia Panel: No results for input(s): VITAMINB12, FOLATE, FERRITIN, TIBC, IRON, RETICCTPCT in the last 72 hours. Urine analysis: No results found for: COLORURINE, APPEARANCEUR, LABSPEC, PHURINE, GLUCOSEU, HGBUR, BILIRUBINUR, [place], [place], [place], NITRITE, LEUKOCYTESUR Sepsis Labs: @LABRCNTIP (procalcitonin:4,lacticidven:4)  ) Recent Results (from the past 240 hour(s))  Blood Culture (routine x 2)     Status: None (Preliminary result)   Collection Time: 12/03/21  6:35 AM   Specimen: BLOOD  Result Value Ref Range Status   Specimen Description BLOOD LEFT AC  Final   Special Requests   Final    BOTTLES DRAWN AEROBIC AND ANAEROBIC Blood Culture adequate volume   Culture   Final    NO GROWTH 1 DAY Performed at [facility], [address], [place], [place] [address]    Report Status PENDING  Incomplete  Culture, blood (Routine X 2) w Reflex to ID Panel     Status: None (Preliminary result)   Collection Time: 12/03/21  6:35 AM   Specimen: BLOOD  Result Value Ref Range Status   Specimen Description BLOOD LEFT HAND  Final   Special Requests   Final    BOTTLES DRAWN AEROBIC AND ANAEROBIC Blood Culture adequate volume   Culture   Final    NO GROWTH < 24 HOURS Performed at [facility], [address], [place], [place] [address]    Report Status PENDING  Incomplete  Resp Panel by RT-PCR (Flu A&B, Covid) Nasopharyngeal Swab     Status: None   Collection Time: 12/03/21  8:05  AM   Specimen: Nasopharyngeal Swab; Nasopharyngeal(NP) swabs in vial transport medium  Result Value Ref Range Status   SARS Coronavirus 2 by RT PCR NEGATIVE NEGATIVE Final    Comment: (NOTE) SARS-CoV-2 target nucleic acids are NOT DETECTED.  The SARS-CoV-2 RNA is generally detectable in upper respiratory specimens during the acute phase of infection. The lowest concentration of SARS-CoV-2 viral copies this assay can detect is 138 copies/mL. A negative result does not preclude SARS-Cov-2 infection and should not be used as the sole basis for treatment or other patient management decisions. A negative result may occur with  improper specimen collection/handling, submission of specimen other than nasopharyngeal swab, presence of viral mutation(s) within the areas targeted by this assay, and inadequate number of viral copies(<138 copies/mL). A negative result must be combined with clinical observations, patient history, and epidemiological information. The expected result is Negative.  Fact Sheet for Patients:  EntrepreneurPulse.com.au  Fact Sheet for Healthcare Providers:  IncredibleEmployment.be  This test is no t yet approved  or cleared by the [place] and  has been authorized for detection and/or diagnosis of SARS-CoV-2 by FDA under an Emergency Use Authorization (EUA). This EUA will remain  in effect (meaning this test can be used) for the duration of the COVID-19 declaration under Section 564(b)(1) of the Act, 21 U.S.C.section 360bbb-3(b)(1), unless the authorization is terminated  or revoked sooner.       Influenza A by PCR NEGATIVE NEGATIVE Final   Influenza B by PCR NEGATIVE NEGATIVE Final    Comment: (NOTE) The Xpert Xpress SARS-CoV-2/FLU/RSV plus assay is intended as an aid in the diagnosis of influenza from Nasopharyngeal swab specimens and should not be used as a sole basis for treatment. Nasal washings and aspirates are  unacceptable for Xpert Xpress SARS-CoV-2/FLU/RSV testing.  Fact Sheet for Patients: EntrepreneurPulse.com.au  Fact Sheet for Healthcare Providers: IncredibleEmployment.be  This test is not yet approved or cleared by the [place] FDA and has been authorized for detection and/or diagnosis of SARS-CoV-2 by FDA under an Emergency Use Authorization (EUA). This EUA will remain in effect (meaning this test can be used) for the duration of the COVID-19 declaration under Section 564(b)(1) of the Act, 21 U.S.C. section 360bbb-3(b)(1), unless the authorization is terminated or revoked.  Performed at [facility], [address], [place], [place] [address]       Studies: No results found.  Scheduled Meds:  enoxaparin (LOVENOX) injection  40 mg Subcutaneous Q24H   potassium chloride  40 mEq Oral Once   sodium chloride flush  3 mL Intravenous Q12H    Continuous Infusions:  sodium chloride 250 mL (12/04/21 0313)    ceFAZolin (ANCEF) IV 1 g (12/04/21 0956)     LOS: 1 day     Kayleen Memos, MD [facility] Pager [phone]  If 7PM-7AM, please contact night-coverage [email or web address] Password [email or web address] 12/04/2021, 3:39 PM

## 2021-12-05 ENCOUNTER — Telehealth: Payer: Self-pay

## 2021-12-05 DIAGNOSIS — A419 Sepsis, unspecified organism: Secondary | ICD-10-CM | POA: Diagnosis not present

## 2021-12-05 DIAGNOSIS — D649 Anemia, unspecified: Secondary | ICD-10-CM

## 2021-12-05 DIAGNOSIS — E876 Hypokalemia: Secondary | ICD-10-CM | POA: Diagnosis not present

## 2021-12-05 DIAGNOSIS — L0390 Cellulitis, unspecified: Secondary | ICD-10-CM | POA: Diagnosis not present

## 2021-12-05 DIAGNOSIS — F17210 Nicotine dependence, cigarettes, uncomplicated: Secondary | ICD-10-CM | POA: Diagnosis not present

## 2021-12-05 LAB — CBC
HCT: 32.2 % — ABNORMAL LOW (ref 36.0–46.0)
Hemoglobin: 10.3 g/dL — ABNORMAL LOW (ref 12.0–15.0)
MCH: 27.7 pg (ref 26.0–34.0)
MCHC: 32 g/dL (ref 30.0–36.0)
MCV: 86.6 fL (ref 80.0–100.0)
Platelets: 192 10*3/uL (ref 150–400)
RBC: 3.72 MIL/uL — ABNORMAL LOW (ref 3.87–5.11)
RDW: 15.3 % (ref 11.5–15.5)
WBC: 8.8 10*3/uL (ref 4.0–10.5)
nRBC: 0 % (ref 0.0–0.2)

## 2021-12-05 LAB — BASIC METABOLIC PANEL
Anion gap: 6 (ref 5–15)
BUN: 15 mg/dL (ref 8–23)
CO2: 27 mmol/L (ref 22–32)
Calcium: 8.9 mg/dL (ref 8.9–10.3)
Chloride: 108 mmol/L (ref 98–111)
Creatinine, Ser: 0.85 mg/dL (ref 0.44–1.00)
GFR, Estimated: >60 mL/min (ref >60)
Glucose, Bld: 105 mg/dL — ABNORMAL HIGH (ref 70–99)
Potassium: 3.6 mmol/L (ref 3.5–5.1)
Sodium: 141 mmol/L (ref 135–145)

## 2021-12-05 LAB — MRSA NEXT GEN BY PCR, NASAL: MRSA by PCR Next Gen: NOT DETECTED

## 2021-12-05 LAB — MAGNESIUM: Magnesium: 2.1 mg/dL (ref 1.7–2.4)

## 2021-12-05 LAB — PHOSPHORUS: Phosphorus: 3.6 mg/dL (ref 2.5–4.6)

## 2021-12-05 MED ORDER — AMOXICILLIN-POT CLAVULANATE 875-125 MG PO TABS: 1.0000 | ORAL_TABLET | Freq: Two times a day (BID) | ORAL | 0 refills | Status: DC

## 2021-12-05 NOTE — Discharge Summary (Signed)
Physician Discharge Summary   Patient: Amber Figueroa MRN: [id number] DOB: 1961/03/02  Admit date:     12/03/2021  Discharge date: 12/05/21  Discharge Physician: Marrion Coy   PCP: Lorre Munroe, NP   Recommendations at discharge:   Complete antibiotics Follow-up with PCP 1 week.  Discharge Diagnoses: Principal Problem:   Sepsis due to cellulitis [facility]) Active Problems:   Nicotine dependence   Hypokalemia   Normocytic anemia  Resolved Problems:   * No resolved hospital problems. *  Hospital Course:  Amber Figueroa is a 61 y.o. female with medical history significant for nicotine dependence, hypertension who presents for evaluation of right leg pain, redness, and swelling extending from the dorsum of the right foot to the right knee.  Work-up revealed sepsis secondary to right lower extremity cellulitis.  She was started on IV antibiotics empirically, blood cultures were obtained.   Blood culture came back negative, right leg swelling much improved.  Assessment and Plan: * Sepsis due to cellulitis ([facility]) Sepsis secondary to cellulitis as evidenced by Tmax of 100.3, tachycardia, leukocytosis and right lower extremity cellulitis Patient received IV fluids, treated with cefazolin.  Condition much improved, swelling has come down.  Tenderness much better.  At this point, patient is medically stable to be discharged, will continue oral antibiotic with Augmentin for 5 days.  Blood culture came back as negative.  Normocytic anemia Patient's has no active bleeding.  Follow-up with PCP as outpatient.  Hemoglobin stable at 10.3.  Hypokalemia Potassium has normalized today.  Nicotine dependence Smoking cessation advised         Consultants: None Procedures performed: None  Disposition: Home Diet recommendation:  Discharge Diet Orders (From admission, onward)     Start     Ordered   12/05/21 0000  Diet - low sodium heart healthy        12/05/21 1011           Cardiac  diet DISCHARGE MEDICATION: Allergies as of 12/05/2021   No Known Allergies      Medication List     STOP taking these medications    cephALEXin 500 MG capsule Commonly known as: KEFLEX   ibuprofen 800 MG tablet Commonly known as: ADVIL       TAKE these medications    amoxicillin-clavulanate 875-125 MG tablet Commonly known as: Augmentin Take 1 tablet by mouth 2 (two) times daily for 5 days.        Follow-up Information     Lorre Munroe, NP Follow up in 1 week(s).   Specialties: Internal Medicine, Emergency Medicine Contact information: [address] [place] [address] [phone]                Discharge Exam: Ceasar Mons Weights   12/03/21 7616  Weight: 81.6 kg   General exam: Appears calm and comfortable  Respiratory system: Clear to auscultation. Respiratory effort normal. Cardiovascular system: S1 & S2 heard, RRR. No JVD, murmurs, rubs, gallops or clicks. No pedal edema. Gastrointestinal system: Abdomen is nondistended, soft and nontender. No organomegaly or masses felt. Normal bowel sounds heard. Central nervous system: Alert and oriented. No focal neurological deficits. Extremities: Leg mildly edematous, no significant tenderness. Skin: No rashes, lesions or ulcers Psychiatry: Judgement and insight appear normal. Mood & affect appropriate.    Condition at discharge: good  The results of significant diagnostics from this hospitalization (including imaging, microbiology, ancillary and laboratory) are listed below for reference.   Imaging Studies: US Venous Img Lower Unilateral Right  Result Date: 12/03/2021 CLINICAL DATA:  Pain and swelling EXAM: Right LOWER EXTREMITY VENOUS DOPPLER ULTRASOUND TECHNIQUE: Gray-scale sonography with compression, as well as color and duplex ultrasound, were performed to evaluate the deep venous system(s) from the level of the common femoral vein through the popliteal and proximal calf veins. COMPARISON:  None. FINDINGS:  VENOUS Normal compressibility of the common femoral, superficial femoral, and popliteal veins, as well as the visualized calf veins. Visualized portions of profunda femoral vein and great saphenous vein unremarkable. No filling defects to suggest DVT on grayscale or color Doppler imaging. Doppler waveforms show normal direction of venous flow, normal respiratory plasticity and response to augmentation. Limited views of the contralateral common femoral vein are unremarkable. OTHER There are few slightly enlarged lymph nodes in the inguinal region and popliteal fossa, possibly suggesting reactive hyperplasia. There is edema in the subcutaneous plane. Limitations: none IMPRESSION: There is no evidence of deep venous thrombosis in the right lower extremity. Electronically Signed   By: Ernie AvenaPalani  Rathinasamy M.D.   On: 12/03/2021 09:43    Microbiology: Results for orders placed or performed during the hospital encounter of 12/03/21  Blood Culture (routine x 2)     Status: None (Preliminary result)   Collection Time: 12/03/21  6:35 AM   Specimen: BLOOD  Result Value Ref Range Status   Spec[facility]  Final   Special Requests   Final    BOTTLES DRAWN AEROBIC AND ANAEROBIC Blood Culture adequate volume   Culture   Final    NO GROWTH 2 DAYS Performed at [facility], [address] [place], [place] [address]    Report Status PENDING  Incomplete  Culture, blood (Routine X 2) w Reflex to ID Panel     Status: None (Preliminary result)   Collection Time: 12/03/21  6:35 AM   Specimen: BLOOD  Result Value Ref Range Status   Specimen Description BLOOD LEFT HAND  Final   Special Requests   Final    BOTTLES DRAWN AEROBIC AND ANAEROBIC Blood Culture adequate volume   Culture   Final    NO GROWTH 2 DAYS Performed at [facility], [address] [place], [place] [address]    Report Status PENDING  Incomplete  Resp Panel by RT-PCR (Flu A&B, Covid) Nasopharyngeal Swab      Status: None   Collection Time: 12/03/21  8:05 AM   Specimen: Nasopharyngeal Swab; Nasopharyngeal(NP) swabs in vial transport medium  Result Value Ref Range Status   SARS Coronavirus 2 by RT PCR NEGATIVE NEGATIVE Final    Comment: (NOTE) SARS-CoV-2 target nucleic acids are NOT DETECTED.  The SARS-CoV-2 RNA is generally detectable in upper respiratory specimens during the acute phase of infection. The lowest concentration of SARS-CoV-2 viral copies this assay can detect is 138 copies/mL. A negative result does not preclude SARS-Cov-2 infection and should not be used as the sole basis for treatment or other patient management decisions. A negative result may occur with  improper specimen collection/handling, submission of specimen other than nasopharyngeal swab, presence of viral mutation(s) within the areas targeted by this assay, and inadequate number of viral copies(<138 copies/mL). A negative result must be combined with clinical observations, patient history, and epidemiological information. The expected result is Negative.  Fact Sheet for Patients:  BloggerCourse.comhttps://www.fda.gov/media/152166/download  Fact Sheet for Healthcare Providers:  SeriousBroker.ithttps://www.fda.gov/media/152162/download  This test is no t yet approved or cleared by the [place] FDA and  has been authorized for detection and/or diagnosis of  SARS-CoV-2 by FDA under an Emergency Use Authorization (EUA). This EUA will remain  in effect (meaning this test can be used) for the duration of the COVID-19 declaration under Section 564(b)(1) of the Act, 21 U.S.C.section 360bbb-3(b)(1), unless the authorization is terminated  or revoked sooner.       Influenza A by PCR NEGATIVE NEGATIVE Final   Influenza B by PCR NEGATIVE NEGATIVE Final    Comment: (NOTE) The Xpert Xpress SARS-CoV-2/FLU/RSV plus assay is intended as an aid in the diagnosis of influenza from Nasopharyngeal swab specimens and should not be used as a sole basis  for treatment. Nasal washings and aspirates are unacceptable for Xpert Xpress SARS-CoV-2/FLU/RSV testing.  Fact Sheet for Patients: BloggerCourse.com  Fact Sheet for Healthcare Providers: SeriousBroker.it  This test is not yet approved or cleared by the [place] FDA and has been authorized for detection and/or diagnosis of SARS-CoV-2 by FDA under an Emergency Use Authorization (EUA). This EUA will remain in effect (meaning this test can be used) for the duration of the COVID-19 declaration under Section 564(b)(1) of the Act, 21 U.S.C. section 360bbb-3(b)(1), unless the authorization is terminated or revoked.  Performed at [facility], [address] [place], [place] [address]   MRSA Next Gen by PCR, Nasal     Status: None   Collection Time: 12/05/21  6:09 AM   Specimen: Nasal Mucosa; Nasal Swab  Result Value Ref Range Status   MRSA by PCR Next Gen NOT DETECTED NOT DETECTED Final    Comment: (NOTE) The GeneXpert MRSA Assay (FDA approved for NASAL specimens only), is one component of a comprehensive MRSA colonization surveillance program. It is not intended to diagnose MRSA infection nor to guide or monitor treatment for MRSA infections. Test performance is not FDA approved in patients less than 19 years old. Performed at [facility], [address] [place], [place] [address]     Labs: CBC: Recent Labs  Lab 12/03/21 0622 12/04/21 0414 12/05/21 0305  WBC 12.3* 13.1* 8.8  HGB 12.5 10.3* 10.3*  HCT 38.8 32.3* 32.2*  MCV 86.4 86.6 86.6  PLT 231 205 192   Basic Metabolic Panel: Recent Labs  Lab 12/03/21 0622 12/04/21 0414 12/05/21 0305  NA 140 139 141  K 3.3* 3.4* 3.6  CL 106 107 108  CO2 25 26 27   GLUCOSE 141* 103* 105*  BUN 14 13 15   CREATININE 0.97 0.83 0.85  CALCIUM 9.1 8.5* 8.9  MG 1.8  --  2.1  PHOS  --   --  3.6   Liver Function Tests: Recent Labs  Lab 12/03/21 0622   AST 22  ALT 12  ALKPHOS 64  BILITOT 0.9  PROT 7.4  ALBUMIN 3.7   CBG: No results for input(s): GLUCAP in the last 168 hours.  Discharge time spent: less than 30 minutes.  Signed: , MD [facility] 12/05/2021

## 2021-12-05 NOTE — Progress Notes (Signed)
Discharge instructions reviewed with pt. She verbalized understanding of instructions. Work note given to patient and discharge packet ?

## 2021-12-05 NOTE — Telephone Encounter (Signed)
Left VM for her to return my call 

## 2021-12-05 NOTE — Clinical Social Work Note (Signed)
?  Transition of Care (TOC) Screening Note ? ? ?Patient Details  ?Name: Amber Figueroa ?Date of Birth: July 01, 1961 ? ? ?Transition of Care (TOC) CM/SW Contact:    ?Lorren Rossetti A Avondre Richens, LCSW ?Phone [phone] ?12/05/2021, 10:32 AM ? ? ? ?Transition of Care Department [facility]) has reviewed patient and no TOC needs have been identified at this time. We will continue to monitor patient advancement through interdisciplinary progression rounds. If new patient transition needs arise, please place a TOC consult. ?  ?

## 2021-12-05 NOTE — Hospital Course (Signed)
Amber Figueroa is a 61 y.o. female with medical history significant for nicotine dependence, hypertension who presents for evaluation of right leg pain, redness, and swelling extending from the dorsum of the right foot to the right knee.  Work-up revealed sepsis secondary to right lower extremity cellulitis.  She was started on IV antibiotics empirically, blood cultures were obtained. ?  ?Blood culture came back negative, right leg swelling much improved. ?

## 2021-12-05 NOTE — Assessment & Plan Note (Signed)
Potassium has normalized today. ?

## 2021-12-05 NOTE — Progress Notes (Signed)
Pt refused scheduled Lovenox and Potassium Chloride supplement and pt educated. ?

## 2021-12-05 NOTE — Assessment & Plan Note (Signed)
Patient's has no active bleeding.  Follow-up with PCP as outpatient.  Hemoglobin stable at 10.3. ?

## 2021-12-08 LAB — CULTURE, BLOOD (ROUTINE X 2)
Culture: NO GROWTH
Culture: NO GROWTH
Special Requests: ADEQUATE
Special Requests: ADEQUATE

## 2021-12-10 ENCOUNTER — Encounter: Payer: Self-pay | Admitting: Internal Medicine

## 2021-12-10 ENCOUNTER — Ambulatory Visit: Payer: PRIVATE HEALTH INSURANCE | Admitting: Internal Medicine

## 2021-12-10 ENCOUNTER — Other Ambulatory Visit: Payer: Self-pay

## 2021-12-10 VITALS — BP 154/81 | HR 80 | Temp 97.3°F | Wt 194.0 lb

## 2021-12-10 DIAGNOSIS — Z6827 Body mass index (BMI) 27.0-27.9, adult: Secondary | ICD-10-CM

## 2021-12-10 DIAGNOSIS — Z1231 Encounter for screening mammogram for malignant neoplasm of breast: Secondary | ICD-10-CM

## 2021-12-10 DIAGNOSIS — L03115 Cellulitis of right lower limb: Secondary | ICD-10-CM | POA: Diagnosis not present

## 2021-12-10 DIAGNOSIS — E663 Overweight: Secondary | ICD-10-CM | POA: Diagnosis not present

## 2021-12-10 DIAGNOSIS — I890 Lymphedema, not elsewhere classified: Secondary | ICD-10-CM

## 2021-12-10 MED ORDER — AMOXICILLIN-POT CLAVULANATE 875-125 MG PO TABS: 1.0000 | ORAL_TABLET | Freq: Two times a day (BID) | ORAL | 0 refills | Status: DC

## 2021-12-10 NOTE — Patient Instructions (Signed)
Cellulitis, Adult Cellulitis is a skin infection. The infected area is often warm, red, swollen, and sore. It occurs most often in the arms and lower legs. It is very important to get treated for this condition. What are the causes? This condition is caused by bacteria. The bacteria enter through a break in the skin, such as a cut, burn, insect bite, open sore, or crack. What increases the risk? This condition is more likely to occur in people who: Have a weak body defense system (immune system). Have open cuts, burns, bites, or scrapes on the skin. Are older than 62 years of age. Have a blood sugar problem (diabetes). Have a long-lasting (chronic) liver disease (cirrhosis) or kidney disease. Are very overweight (obese). Have a skin problem, such as: Itchy rash (eczema). Slow movement of blood in the veins (venous stasis). Fluid buildup below the skin (edema). Have been treated with high-energy rays (radiation). Use IV drugs. What are the signs or symptoms? Symptoms of this condition include: Skin that is: Red. Streaking. Spotting. Swollen. Sore or painful when you touch it. Warm. A fever. Chills. Blisters. How is this diagnosed? This condition is diagnosed based on: Medical history. Physical exam. Blood tests. Imaging tests. How is this treated? Treatment for this condition may include: Medicines to treat infections or allergies. Home care, such as: Rest. Placing cold or warm cloths (compresses) on the skin. Hospital care, if the condition is very bad. Follow these instructions at home: Medicines Take over-the-counter and prescription medicines only as told by your doctor. If you were prescribed an antibiotic medicine, take it as told by your doctor. Do not stop taking it even if you start to feel better. General instructions  Drink enough fluid to keep your pee (urine) pale yellow. Do not touch or rub the infected area. Raise (elevate) the infected area above the  level of your heart while you are sitting or lying down. Place cold or warm cloths on the area as told by your doctor. Keep all follow-up visits as told by your doctor. This is important. Contact a doctor if: You have a fever. You do not start to get better after 1-2 days of treatment. Your bone or joint under the infected area starts to hurt after the skin has healed. Your infection comes back. This can happen in the same area or another area. You have a swollen bump in the area. You have new symptoms. You feel ill and have muscle aches and pains. Get help right away if: Your symptoms get worse. You feel very sleepy. You throw up (vomit) or have watery poop (diarrhea) for a long time. You see red streaks coming from the area. Your red area gets larger. Your red area turns dark in color. These symptoms may represent a serious problem that is an emergency. Do not wait to see if the symptoms will go away. Get medical help right away. Call your local emergency services (911 in the [place] Do not drive yourself to the hospital. Summary Cellulitis is a skin infection. The area is often warm, red, swollen, and sore. This condition is treated with medicines, rest, and cold and warm cloths. Take all medicines only as told by your doctor. Tell your doctor if symptoms do not start to get better after 1-2 days of treatment. This information is not intended to replace advice given to you by your health care provider. Make sure you discuss any questions you have with your health care provider. Document Revised: 02/05/2018 Document Reviewed:   02/05/2018 Elsevier Patient Education  2022 [employer]vier Inc[employer]  

## 2021-12-10 NOTE — Assessment & Plan Note (Signed)
Encourage diet and exercise for weight loss 

## 2021-12-10 NOTE — Progress Notes (Signed)
? ?Subjective:  ? ? Patient ID: Amber Figueroa, female    DOB: [birth year], 61 y.o.   MRN: [id number] ? ?HPI ? ?Patient presents the clinic today for hospital follow-up.  She presented to the ER 3/6 with complaint of right lower extremity redness, swelling, pain and fever.  She was diagnosed with cellulitis and treated with IV Ancef.  She was transitioned to oral Augmentin and discharged on 3/8.  Since discharge, she reports persistent right lower extremity swelling and redness.  She reports that infection has improved but has not resolved.  She denies fever, chills, body ache, nausea or vomiting.  She has finished her 5-day course of Augmentin.  She has a history of lymphedema. ? ?She would also like an order to schedule her mammogram. ? ?Review of Systems ? ?Past Medical History:  ?Diagnosis Date  ? Back pain   ? ? ?Current Outpatient Medications  ?Medication Sig Dispense Refill  ? amoxicillin-clavulanate (AUGMENTIN) 875-125 MG tablet Take 1 tablet by mouth 2 (two) times daily for 5 days. 10 tablet 0  ? ?No current facility-administered medications for this visit.  ? ? ?No Known Allergies ? ?Family History  ?Problem Relation Age of Onset  ? Cancer Mother   ? Cancer Father   ?     unknown type  ? Hypertension Sister   ? Hypertension Brother   ? Hyperlipidemia Brother   ? Breast cancer Maternal Grandmother   ?     likely breast ca - mastectomy  ? Hypertension Brother   ? Diabetes Brother   ? Hypertension Brother   ? Heart attack Neg Hx   ? Stroke Neg Hx   ? Colon cancer Neg Hx   ? Ovarian cancer Neg Hx   ? ? ?Social History  ? ?Socioeconomic History  ? Marital status: Married  ?  Spouse name: Not on file  ? Number of children: Not on file  ? Years of education: Not on file  ? Highest education level: Not on file  ?Occupational History  ? Not on file  ?Tobacco Use  ? Smoking status: Every Day  ?  Packs/day: 0.50  ?  Years: 1.00  ?  Pack years: 0.50  ?  Types: Cigarettes  ? Smokeless tobacco: Never  ? Tobacco comments:   ?  age [age] smoked 1/2 ppd, quit x 20 years, now smoking 1 year  ?Vaping Use  ? Vaping Use: Never used  ?Substance and Sexual Activity  ? Alcohol use: No  ? Drug use: No  ? Sexual activity: Never  ?Other Topics Concern  ? Not on file  ?Social History Narrative  ? Not on file  ? ?Social Determinants of Health  ? ?Financial Resource Strain: Not on file  ?Food Insecurity: Not on file  ?Transportation Needs: Not on file  ?Physical Activity: Not on file  ?Stress: Not on file  ?Social Connections: Not on file  ?Intimate Partner Violence: Not on file  ? ? ? ?Constitutional: Denies fever, malaise, fatigue, headache or abrupt weight changes.  ?Respiratory: Denies difficulty breathing, shortness of breath, cough or sputum production.   ?Cardiovascular: Denies chest pain, chest tightness, palpitations or swelling in the hands or feet.  ?Musculoskeletal: Patient reports swelling of her right lower extremity.  Denies decrease in range of motion, difficulty with gait, muscle pain or joint pain.  ?Skin: Patient reports redness and warmth of her right lower extremity.  Denies lesions or ulcercations.  ? ? ?No other specific complaints in a complete review  of systems (except as listed in HPI above). ? ?   ?Objective:  ? Physical Exam ?BP (!) 154/81 (BP Location: Left Arm, Patient Position: Sitting, Cuff Size: Large)   Pulse 80   Temp (!) 97.3 ?F (36.3 ?C) (Temporal)   Wt 194 lb (88 kg)   SpO2 100%   BMI 27.06 kg/m?  ? ?Wt Readings from Last 3 Encounters:  ?12/03/21 180 lb (81.6 kg)  ?09/10/21 197 lb 3.2 oz (89.4 kg)  ?07/04/21 207 lb 9.6 oz (94.2 kg)  ? ? ?General: Appears her stated age, overweight, in NAD. ?Skin: Persistent cellulitis of the RLE. ?Cardiovascular: Normal rate and rhythm. S1,S2 noted.  No murmur, rubs or gallops noted.  1+ nonpitting edema of the RLE. ?Pulmonary/Chest: Normal effort and positive vesicular breath sounds. No respiratory distress. No wheezes, rales or ronchi noted.  ?Musculoskeletal: No  difficulty with gait.  ?Neurological: Alert and oriented.   ? ? ?BMET ?   ?Component Value Date/Time  ? NA 141 12/05/2021 0305  ? K 3.6 12/05/2021 0305  ? CL 108 12/05/2021 0305  ? CO2 27 12/05/2021 0305  ? GLUCOSE 105 (H) 12/05/2021 0305  ? BUN 15 12/05/2021 0305  ? CREATININE 0.85 12/05/2021 0305  ? CREATININE 0.75 06/09/2017 0832  ? CALCIUM 8.9 12/05/2021 0305  ? GFRNONAA >60 12/05/2021 0305  ? GFRNONAA 89 06/09/2017 0832  ? GFRAA >60 04/08/2019 0226  ? GFRAA 103 06/09/2017 0832  ? ? ?Lipid Panel  ?   ?Component Value Date/Time  ? CHOL 169 06/09/2017 0832  ? TRIG 52 06/09/2017 0832  ? HDL 63 06/09/2017 0832  ? CHOLHDL 2.7 06/09/2017 0832  ? LDLCALC 93 06/09/2017 0832  ? ? ?CBC ?   ?Component Value Date/Time  ? WBC 8.8 12/05/2021 0305  ? RBC 3.72 (L) 12/05/2021 0305  ? HGB 10.3 (L) 12/05/2021 0305  ? HCT 32.2 (L) 12/05/2021 0305  ? PLT 192 12/05/2021 0305  ? MCV 86.6 12/05/2021 0305  ? MCH 27.7 12/05/2021 0305  ? MCHC 32.0 12/05/2021 0305  ? RDW 15.3 12/05/2021 0305  ? LYMPHSABS 1,410 06/09/2017 0832  ? EOSABS 291 06/09/2017 0832  ? BASOSABS 41 06/09/2017 0832  ? ? ?Hgb A1C ?Lab Results  ?Component Value Date  ? HGBA1C 5.0 06/09/2017  ? ? ? ? ? ? ? ?   ?Assessment & Plan:  ? ?Hospital follow-up for RLE Cellulitis, Recurrent secondary to Lymphedema: ? ?Hospital notes, labs and imaging reviewed ?She has finished her course of antibiotics, will extend Augmentin for another 7 days ?Encouraged elevation ?Referral placed to lymphedema clinic ? ?Screen for Breast Cancer: ? ?Mammogram ordered-she will call to schedule ? ? ?Schedule an appt for your annual exam ?Nicki Reaper, NP ?This visit occurred during the SARS-CoV-2 public health emergency.  Safety protocols were in place, including screening questions prior to the visit, additional usage of staff PPE, and extensive cleaning of exam room while observing appropriate contact time as indicated for disinfecting solutions.  ? ?

## 2022-01-02 ENCOUNTER — Telehealth: Payer: PRIVATE HEALTH INSURANCE | Admitting: Nurse Practitioner

## 2022-01-02 ENCOUNTER — Ambulatory Visit: Payer: Self-pay | Admitting: *Deleted

## 2022-01-02 ENCOUNTER — Ambulatory Visit: Payer: Self-pay

## 2022-01-02 DIAGNOSIS — L03115 Cellulitis of right lower limb: Secondary | ICD-10-CM | POA: Diagnosis not present

## 2022-01-02 MED ORDER — CEPHALEXIN 500 MG PO CAPS: 500.0000 mg | ORAL_CAPSULE | Freq: Three times a day (TID) | ORAL | 0 refills | Status: DC

## 2022-01-02 NOTE — Progress Notes (Signed)
? ?Virtual Visit Consent  ? ?Amber Figueroa, you are scheduled for a virtual visit with Mary-Margaret Daphine Deutscher, FNP, a [facility] provider, today.   ?  ?Just as with appointments in the office, your consent must be obtained to participate.  Your consent will be active for this visit and any virtual visit you may have with one of our providers in the next 365 days.   ?  ?If you have a MyChart account, a copy of this consent can be sent to you electronically.  All virtual visits are billed to your insurance company just like a traditional visit in the office.   ? ?As this is a virtual visit, video technology does not allow for your provider to perform a traditional examination.  This may limit your provider's ability to fully assess your condition.  If your provider identifies any concerns that need to be evaluated in person or the need to arrange testing (such as labs, EKG, etc.), we will make arrangements to do so.   ?  ?Although advances in technology are sophisticated, we cannot ensure that it will always work on either your end or our end.  If the connection with a video visit is poor, the visit may have to be switched to a telephone visit.  With either a video or telephone visit, we are not always able to ensure that we have a secure connection.    ? ?I need to obtain your verbal consent now.   Are you willing to proceed with your visit today? YES ?  ?Amber Figueroa has provided verbal consent on 01/02/2022 for a virtual visit (video or telephone). ?  ?Mary-Margaret Daphine Deutscher, FNP  ? ?Date: 01/02/2022 10:21 AM ? ? ?Virtual Visit via Video Note  ? ?I, Mary-Margaret Daphine Deutscher, connected with Amber Figueroa ([id number], [birth year]) on 01/02/22 at 10:30 AM EDT by a video-enabled telemedicine application and verified that I am speaking with the correct person using two identifiers. ? ?Location: ?Patient: Virtual Visit Location Patient: Home ?Provider: Virtual Visit Location Provider: Mobile ?  ?I discussed the limitations of  evaluation and management by telemedicine and the availability of in person appointments. The patient expressed understanding and agreed to proceed.   ? ?History of Present Illness: ?Amber Figueroa is a 61 y.o. who identifies as a female who was assigned female at birth, and is being seen today for cellulitis. ? ?HPI: Patient states that her right lower leg is erythematous and warm to the touch. She has had cellulitis in that lower leg 4-5 times in the last several months. She says it is not swollen. She has been on several different antibiotics for it and was hospitalizd at one time. I asked if she has been on fluid pills ad she states that the leg is not swollen. ?  ?ROS ? ?Problems:  ?Patient Active Problem List  ? Diagnosis Date Noted  ? Normocytic anemia 12/05/2021  ? Nicotine dependence 12/03/2021  ? Overweight with body mass index (BMI) of 27 to 27.9 in adult 05/22/2021  ? Chronic venous insufficiency 05/22/2021  ?  ?Allergies: No Known Allergies ?Medications:  ?Current Outpatient Medications:  ?  amoxicillin-clavulanate (AUGMENTIN) 875-125 MG tablet, Take 1 tablet by mouth 2 (two) times daily., Disp: 14 tablet, Rfl: 0 ? ?Observations/Objective: ?Patient is well-developed, well-nourished in no acute distress.  ?Resting comfortably  at home.  ?Head is normocephalic, atraumatic.  ?No labored breathing.  ?Speech is clear and coherent with logical content.  ?Patient is alert and oriented at baseline.  ?  Right lower legis erythematous and appear slightly swollen to me- not able to see in video very well. ? ?Assessment and Plan: ? ?Amber Figueroa in today with chief complaint of Cellulitis ? ? ?1. Cellulitis of right lower extremity ?Elevate leg when sitting ?Ice bid ?If doe snot improve needs to see PCP- still think may need fuid pill for full resolution ? ?Meds ordered this encounter  ?Medications  ? cephALEXin (KEFLEX) 500 MG capsule  ?  Sig: Take 1 capsule (500 mg total) by mouth 3 (three) times daily.  ?  Dispense:   30 capsule  ?  Refill:  0  ?  Order Specific Question:   Supervising Provider  ?  Answer:   Eber Hong [[id number]]  ? ? ? ? ?Follow Up Instructions: ?I discussed the assessment and treatment plan with the patient. The patient was provided an opportunity to ask questions and all were answered. The patient agreed with the plan and demonstrated an understanding of the instructions.  A copy of instructions were sent to the patient via MyChart. ? ?The patient was advised to call back or seek an in-person evaluation if the symptoms worsen or if the condition fails to improve as anticipated. ? ?Time:  ?I spent 30 minutes with the patient via telehealth technology discussing the above problems/concerns.   ? ?Mary-Margaret Daphine Deutscher, FNP ? ?

## 2022-01-02 NOTE — Telephone Encounter (Signed)
? ? ? ?  Chief Complaint: right leg redness and warmth ?Symptoms: mild swelling ?Frequency: recurrence ? since March ?Pertinent Negatives: Patient denies chest pian or difficulty breathing ?Disposition: [] ED /[] Urgent Care (no appt availability in office) / [] Appointment(In office/virtual)/ [x]  [place] [facility]/ [] Home Care/ [] Refused Recommended Disposition /[] [place] [facility]/ []  Follow-up with PCP ?Additional Notes: no available appts in office - made VV appt for pt. ? ?Reason for Disposition ? [1] MILD swelling of both ankles (i.e., pedal edema) AND [2] new-onset or worsening ? ?Answer Assessment - Initial Assessment Questions ?1. ONSET: "When did the swelling start?" (e.g., minutes, hours, days) ?    Last night ?2. LOCATION: "What part of the leg is swollen?"  "Are both legs swollen or just one leg?" ?    One leg right leg  ?3. SEVERITY: "How bad is the swelling?" (e.g., localized; mild, moderate, severe) ? - Localized - small area of swelling localized to one leg ? - MILD pedal edema - swelling limited to foot and ankle, pitting edema < 1/4 inch (6 mm) deep, rest and elevation eliminate most or all swelling ? - MODERATE edema - swelling of lower leg to knee, pitting edema > 1/4 inch (6 mm) deep, rest and elevation only partially reduce swelling ? - SEVERE edema - swelling extends above knee, facial or hand swelling present  ?    mild ?4. REDNESS: "Does the swelling look red or infected?" ?    Redness and "fever" ?5. PAIN: "Is the swelling painful to touch?" If Yes, ask: "How painful is it?"   (Scale 1-10; mild, moderate or severe) ?    Mild hurts to touch ?6. FEVER: "Do you have a fever?" If Yes, ask: "What is it, how was it meanosured, and when did it start?"  ?    *No Answer* ?7. CAUSE: "What do you think is causing the leg swelling?" ?    Pt does  ?8. MEDICAL HISTORY: "Do you have a history of heart failure, kidney disease, liver failure, or cancer?" ?    no ?9. RECURRENT SYMPTOM: "Have you had  leg swelling before?" If Yes, ask: "When was the last time?" "What happened that time?" ?    March ?10. OTHER SYMPTOMS: "Do you have any other symptoms?" (e.g., chest pain, difficulty breathing) ?      no ?11. PREGNANCY: "Is there any chance you are pregnant?" "When was your last menstrual period?" ?      *No Answer* ? ?Protocols used: Leg Swelling and Edema-A-AH ? ?

## 2022-01-02 NOTE — Telephone Encounter (Signed)
Pt is requesting to change Keflex (prescribed during today's virtual visit) to a different antibiotic.   ? ?Thanks,  ? ?-Vernona Rieger  ?

## 2022-01-02 NOTE — Telephone Encounter (Signed)
Summary: right leg redness and swelling, infection, and pain/Medication  ? Pt stated she had a virtual appointment and medication cephALEXin (KEFLEX) 500 MG capsule was prescribed, which she has already taken and did not help back on 05/18/21. Pt is requesting new medication. Pt stated she does not want to pick up medication cephALEXin (KEFLEX) 500 MG capsule  because it did not help her the last time and she had to be prescribed something different.   ? ?right leg redness and swelling, infection, and pain.  ? ?Pt seeking clinical advice   ?  ? ?Chief Complaint: saw telehealth, wants different antibiotic ?Symptoms: red swollen legs ?Frequency: constant ?Pertinent Negatives: Patient denies severe redness, states catching it early this time ?Disposition: [] ED /[] Urgent Care (no appt availability in office) / [] Appointment(In office/virtual)/ []  [place] [facility]/ [] Home Care/ [] Refused Recommended Disposition /[] [place] [facility]/ [x]  Follow-up with PCP ?Additional Notes: Pt already called this morning and set up with a virtual [facility] visit. That provider ordered Keflex which she tried last time and it did not work and changed it to Augmentin. Pt requesting call her in new med, not Keflex that telehealth provider ordered. ?Reason for Disposition ? [1] Caller has URGENT medicine question about med that PCP or specialist prescribed AND [2] triager unable to answer question ? ?Answer Assessment - Initial Assessment Questions ?1. NAME of MEDICATION: "What medicine are you calling about?" ?    Keflex ?2. QUESTION: "What is your question?" (e.g., double dose of medicine, side effect) ?    Want something different, Keflex not worked in the past ?3. PRESCRIBING HCP: "Who prescribed it?" Reason: if prescribed by specialist, call should be referred to that group. ?    Telehealth doctor ?4. SYMPTOMS: "Do you have any symptoms?" ?    Red, swollen legs ?5. SEVERITY: If symptoms are present, ask "Are they  mild, moderate or severe?" ?    Moderate, trying to catch early ?6. PREGNANCY:  "Is there any chance that you are pregnant?" "When was your last menstrual period?" ?    Na ? ?Protocols used: Medication Question Call-A-AH ? ?

## 2022-01-02 NOTE — Patient Instructions (Signed)

## 2022-01-03 NOTE — Telephone Encounter (Signed)
I was not the one that prescribed this for her.  She needs to follow-up with me for further evaluation. ?

## 2022-01-03 NOTE — Telephone Encounter (Signed)
Pt advised.   Thanks,   -Tanga Gloor  

## 2022-01-13 IMAGING — US US EXTREM LOW VENOUS*R*
1 series · 13 of 24 positions shown · non-contrast
Comparison: None.

CLINICAL DATA: Right lower extremity pain and edema for the past 2
days. Evaluate for DVT.



[Series 1: us venous img lower uni right (dvt) · portal-venous · 13 of 104 slices shown]
[im 1/104]
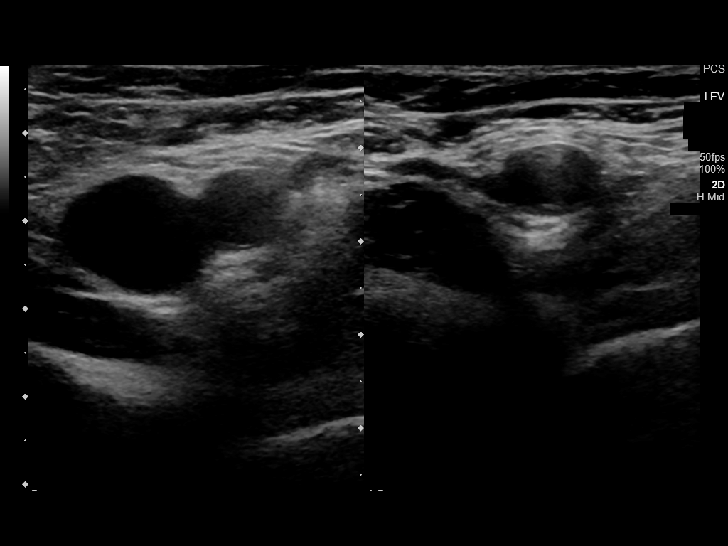
[im 9/104]
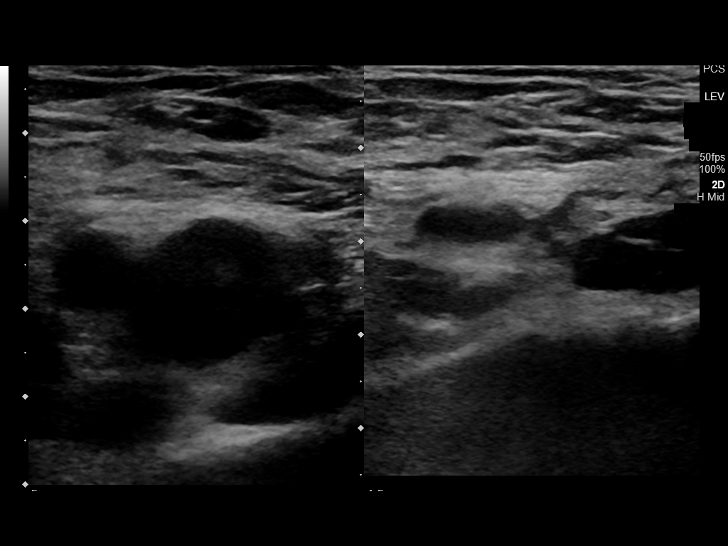
[im 18/104]
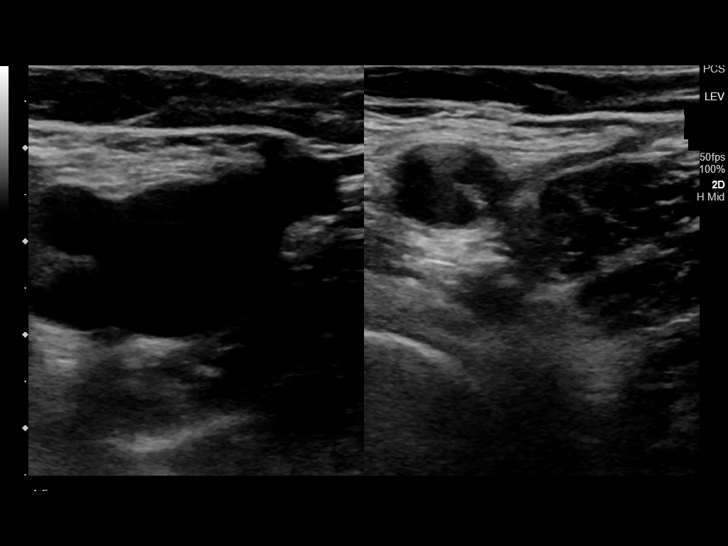
[im 27/104]
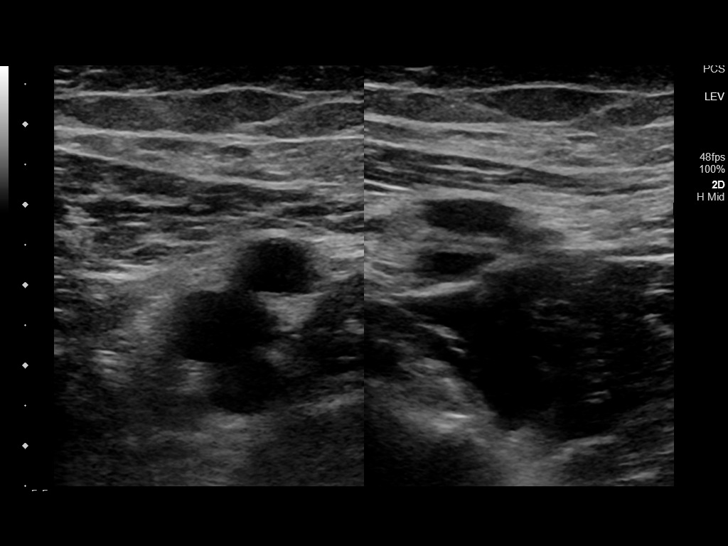
[im 36/104]
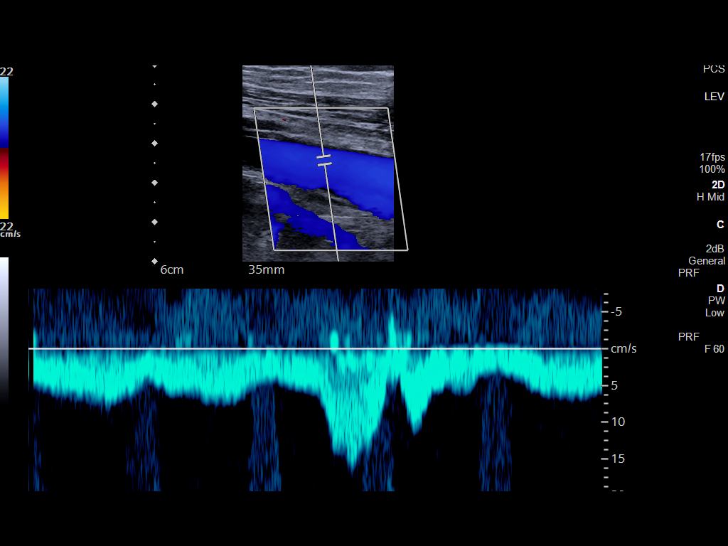
[im 45/104]
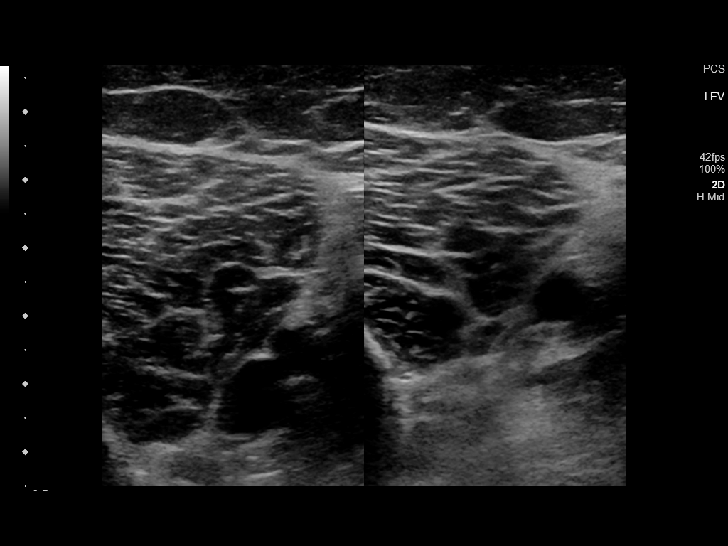
[im 54/104]
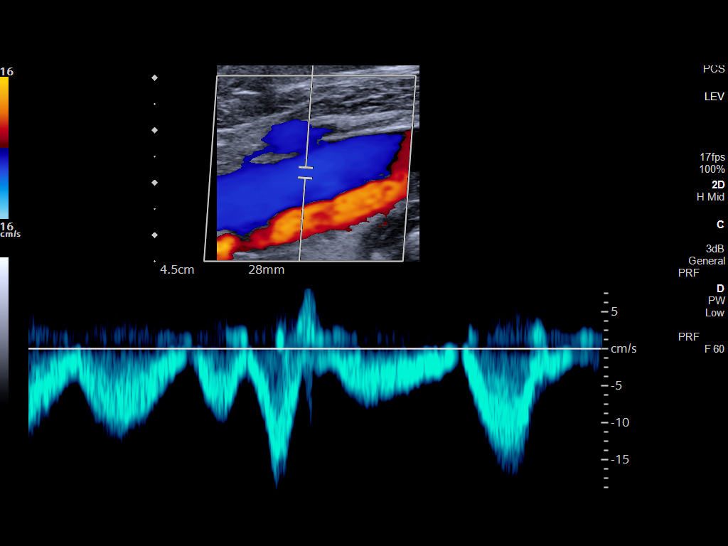
[im 59/104]
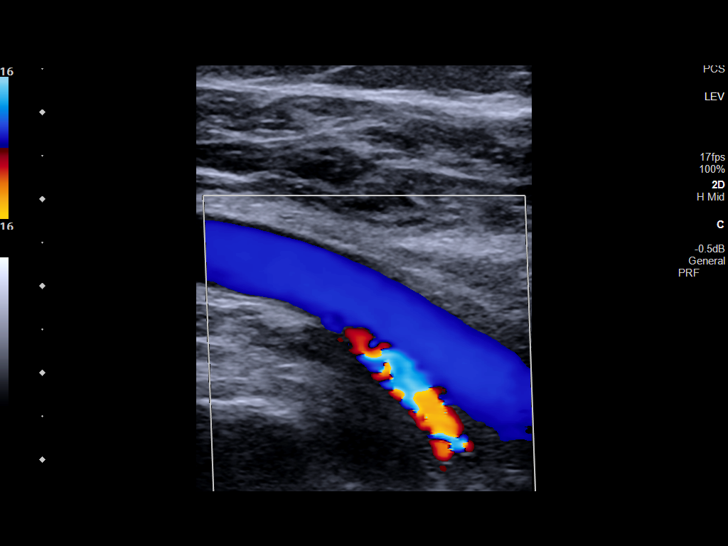
[im 68/104]
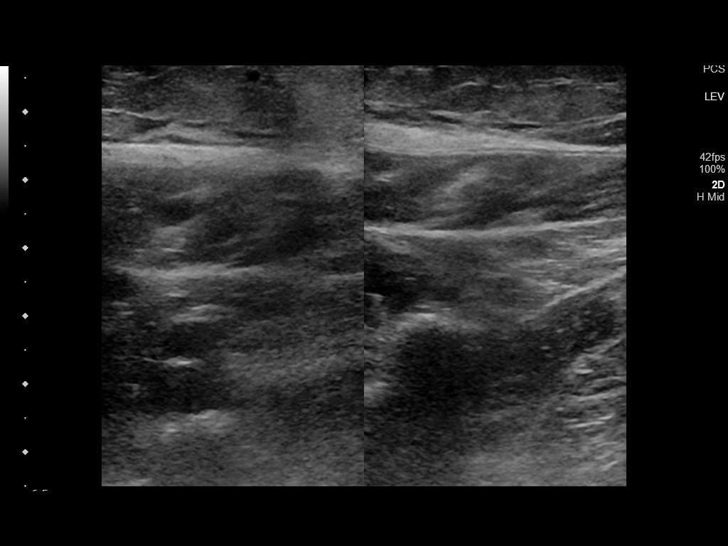
[im 77/104]
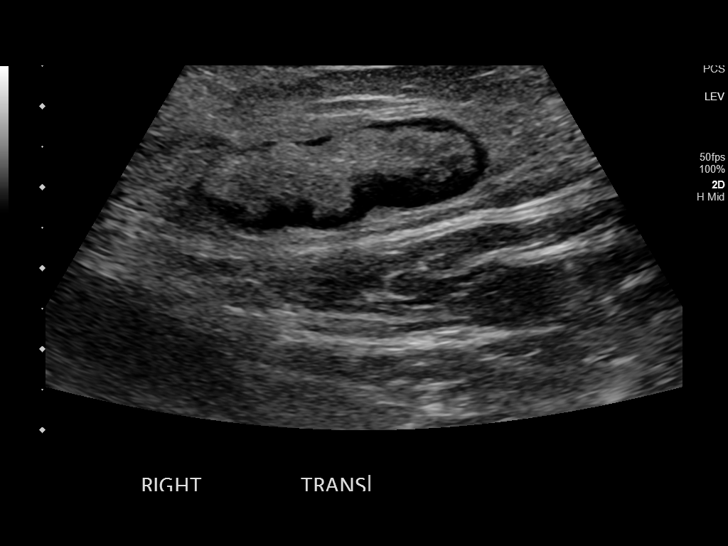
[im 86/104]
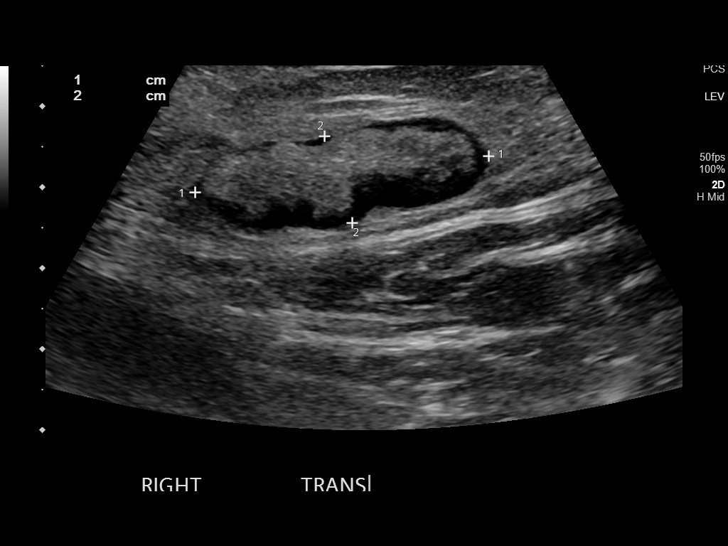
[im 95/104]
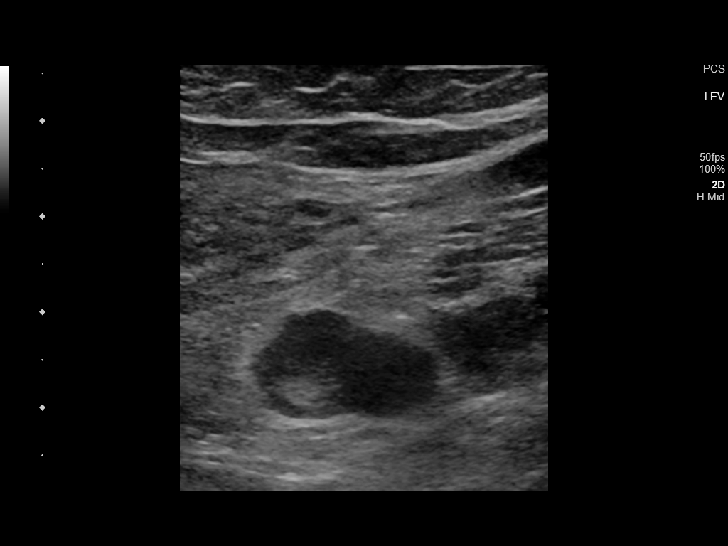
[im 104/104]
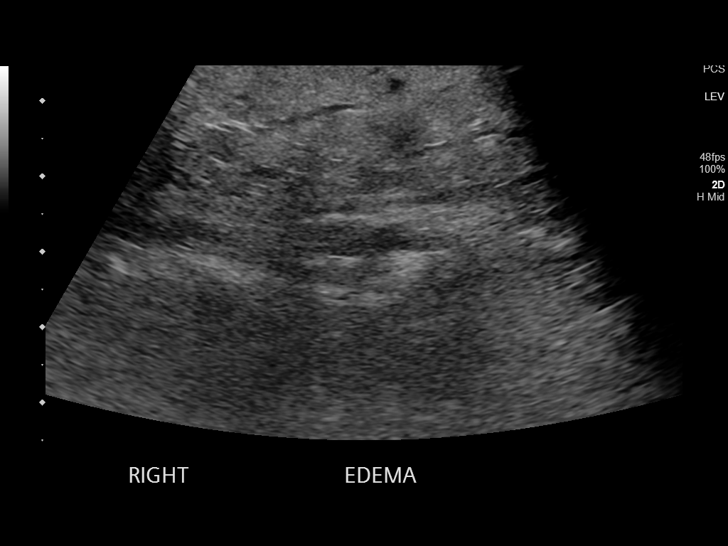

[13 of 24 positions shown; findings below may reference images not displayed]

FINDINGS: Contralateral Common Femoral Vein: Respiratory phasicity is normal
and symmetric with the symptomatic side. No evidence of thrombus.
Normal compressibility.

Common Femoral Vein: No evidence of thrombus. Normal
compressibility, respiratory phasicity and response to augmentation.

Saphenofemoral Junction: No evidence of thrombus. Normal
compressibility and flow on color Doppler imaging.

Profunda Femoral Vein: No evidence of thrombus. Normal
compressibility and flow on color Doppler imaging.

Femoral Vein: No evidence of thrombus. Normal compressibility,
respiratory phasicity and response to augmentation.

Popliteal Vein: No evidence of thrombus. Normal compressibility,
respiratory phasicity and response to augmentation.

Calf Veins: No evidence of thrombus. Normal compressibility and flow
on color Doppler imaging.

Superficial Great Saphenous Vein: No evidence of thrombus. Normal
compressibility.

Venous Reflux:  None.

Other Findings: There is a minimal amount of subcutaneous edema at
the level of the right lower leg. Note is made of a mildly prominent
though benign appearing non pathologically enlarged right lymph node
with an echogenic hilum and thin cortex, presumably reactive in
etiology. Incidental note made of a tiny (approximately 1.9 x 1.2 x
0.9 cm minimally complex fluid collection with the right fossa
compatible with a Baker's cyst.
IMPRESSION: No evidence of DVT within the right lower extremity.

## 2022-01-16 ENCOUNTER — Ambulatory Visit (INDEPENDENT_AMBULATORY_CARE_PROVIDER_SITE_OTHER): Payer: PRIVATE HEALTH INSURANCE | Admitting: Internal Medicine

## 2022-01-16 ENCOUNTER — Other Ambulatory Visit (HOSPITAL_COMMUNITY)
Admission: RE | Admit: 2022-01-16 | Discharge: 2022-01-16 | Disposition: A | Discharge status: Home / Self Care | Payer: PRIVATE HEALTH INSURANCE | Source: Ambulatory Visit | Attending: Internal Medicine | Admitting: Internal Medicine

## 2022-01-16 ENCOUNTER — Encounter: Payer: Self-pay | Admitting: Internal Medicine

## 2022-01-16 VITALS — BP 158/96 | HR 82 | Temp 96.9°F | Ht 71.0 in | Wt 188.0 lb

## 2022-01-16 DIAGNOSIS — Z780 Asymptomatic menopausal state: Secondary | ICD-10-CM

## 2022-01-16 DIAGNOSIS — Z0001 Encounter for general adult medical examination with abnormal findings: Secondary | ICD-10-CM

## 2022-01-16 DIAGNOSIS — Z124 Encounter for screening for malignant neoplasm of cervix: Secondary | ICD-10-CM | POA: Diagnosis present

## 2022-01-16 DIAGNOSIS — I10 Essential (primary) hypertension: Secondary | ICD-10-CM

## 2022-01-16 DIAGNOSIS — Z6826 Body mass index (BMI) 26.0-26.9, adult: Secondary | ICD-10-CM

## 2022-01-16 DIAGNOSIS — Z1231 Encounter for screening mammogram for malignant neoplasm of breast: Secondary | ICD-10-CM | POA: Diagnosis not present

## 2022-01-16 DIAGNOSIS — E663 Overweight: Secondary | ICD-10-CM

## 2022-01-16 MED ORDER — LOSARTAN POTASSIUM 25 MG PO TABS: 25.0000 mg | ORAL_TABLET | Freq: Every day | ORAL | 0 refills | Status: DC

## 2022-01-16 NOTE — Patient Instructions (Signed)
Health Maintenance for Postmenopausal Women Menopause is a normal process in which your ability to get pregnant comes to an end. This process happens slowly over many months or years, usually between the ages of 48 and 55. Menopause is complete when you have missed your menstrual period for 12 months. It is important to talk with your health care provider about some of the most common conditions that affect women after menopause (postmenopausal women). These include heart disease, cancer, and bone loss (osteoporosis). Adopting a healthy lifestyle and getting preventive care can help to promote your health and wellness. The actions you take can also lower your chances of developing some of these common conditions. What are the signs and symptoms of menopause? During menopause, you may have the following symptoms: Hot flashes. These can be moderate or severe. Night sweats. Decrease in sex drive. Mood swings. Headaches. Tiredness (fatigue). Irritability. Memory problems. Problems falling asleep or staying asleep. Talk with your health care provider about treatment options for your symptoms. Do I need hormone replacement therapy? Hormone replacement therapy is effective in treating symptoms that are caused by menopause, such as hot flashes and night sweats. Hormone replacement carries certain risks, especially as you become older. If you are thinking about using estrogen or estrogen with progestin, discuss the benefits and risks with your health care provider. How can I reduce my risk for heart disease and stroke? The risk of heart disease, heart attack, and stroke increases as you age. One of the causes may be a change in the body's hormones during menopause. This can affect how your body uses dietary fats, triglycerides, and cholesterol. Heart attack and stroke are medical emergencies. There are many things that you can do to help prevent heart disease and stroke. Watch your blood pressure High  blood pressure causes heart disease and increases the risk of stroke. This is more likely to develop in people who have high blood pressure readings or are overweight. Have your blood pressure checked: Every 3-5 years if you are 18-39 years of age. Every year if you are 40 years old or older. Eat a healthy diet  Eat a diet that includes plenty of vegetables, fruits, low-fat dairy products, and lean protein. Do not eat a lot of foods that are high in solid fats, added sugars, or sodium. Get regular exercise Get regular exercise. This is one of the most important things you can do for your health. Most adults should: Try to exercise for at least 150 minutes each week. The exercise should increase your heart rate and make you sweat (moderate-intensity exercise). Try to do strengthening exercises at least twice each week. Do these in addition to the moderate-intensity exercise. Spend less time sitting. Even light physical activity can be beneficial. Other tips Work with your health care provider to achieve or maintain a healthy weight. Do not use any products that contain nicotine or tobacco. These products include cigarettes, chewing tobacco, and vaping devices, such as e-cigarettes. If you need help quitting, ask your health care provider. Know your numbers. Ask your health care provider to check your cholesterol and your blood sugar (glucose). Continue to have your blood tested as directed by your health care provider. Do I need screening for cancer? Depending on your health history and family history, you may need to have cancer screenings at different stages of your life. This may include screening for: Breast cancer. Cervical cancer. Lung cancer. Colorectal cancer. What is my risk for osteoporosis? After menopause, you may be   at increased risk for osteoporosis. Osteoporosis is a condition in which bone destruction happens more quickly than new bone creation. To help prevent osteoporosis or  the bone fractures that can happen because of osteoporosis, you may take the following actions: If you are 19-50 years old, get at least 1,000 mg of calcium and at least 600 international units (IU) of vitamin D per day. If you are older than age 50 but younger than age 70, get at least 1,200 mg of calcium and at least 600 international units (IU) of vitamin D per day. If you are older than age 70, get at least 1,200 mg of calcium and at least 800 international units (IU) of vitamin D per day. Smoking and drinking excessive alcohol increase the risk of osteoporosis. Eat foods that are rich in calcium and vitamin D, and do weight-bearing exercises several times each week as directed by your health care provider. How does menopause affect my mental health? Depression may occur at any age, but it is more common as you become older. Common symptoms of depression include: Feeling depressed. Changes in sleep patterns. Changes in appetite or eating patterns. Feeling an overall lack of motivation or enjoyment of activities that you previously enjoyed. Frequent crying spells. Talk with your health care provider if you think that you are experiencing any of these symptoms. General instructions See your health care provider for regular wellness exams and vaccines. This may include: Scheduling regular health, dental, and eye exams. Getting and maintaining your vaccines. These include: Influenza vaccine. Get this vaccine each year before the flu season begins. Pneumonia vaccine. Shingles vaccine. Tetanus, diphtheria, and pertussis (Tdap) booster vaccine. Your health care provider may also recommend other immunizations. Tell your health care provider if you have ever been abused or do not feel safe at home. Summary Menopause is a normal process in which your ability to get pregnant comes to an end. This condition causes hot flashes, night sweats, decreased interest in sex, mood swings, headaches, or lack  of sleep. Treatment for this condition may include hormone replacement therapy. Take actions to keep yourself healthy, including exercising regularly, eating a healthy diet, watching your weight, and checking your blood pressure and blood sugar levels. Get screened for cancer and depression. Make sure that you are up to date with all your vaccines. This information is not intended to replace advice given to you by your health care provider. Make sure you discuss any questions you have with your health care provider. Document Revised: 02/05/2021 Document Reviewed: 02/05/2021 Elsevier Patient Education  2023 Elsevier Inc.  

## 2022-01-16 NOTE — Assessment & Plan Note (Signed)
New onset ?Rx for Losartan 25 mg daily ?Reinforced DASH diet and exercise for weight loss ?

## 2022-01-16 NOTE — Progress Notes (Signed)
? ?Subjective:  ? ? Patient ID: Amber Figueroa, female    DOB: Jun 15, 1961, 61 y.o.   MRN: [id number] ? ?HPI ? ?Patient presents to clinic today for her annual exam. ? ?Of note, her BP today is 158/96. She has never been diagnosed with HTN but it does run in her family. ? ?Flu: never ?Tetanus: 05/2017 ?COVID: Moderna x 2 ?Shingrix: never ?Pap smear: 05/2017 ?Mammogram: 05/2017 ?Bone density: 06/2017 ?Colon screening: 06/2017 ?Vision screening: annually ?Dentist: as needed ? ?Diet: She does eat meat. She consumes some fruits and veggies. She does eat some fried foods. She drinks mostly soda. ?Exercise: None ? ?Review of Systems ? ?   ?Past Medical History:  ?Diagnosis Date  ? Back pain   ? ? ?Current Outpatient Medications  ?Medication Sig Dispense Refill  ? cephALEXin (KEFLEX) 500 MG capsule Take 1 capsule (500 mg total) by mouth 3 (three) times daily. 30 capsule 0  ? ?No current facility-administered medications for this visit.  ? ? ?No Known Allergies ? ?Family History  ?Problem Relation Age of Onset  ? Cancer Mother   ? Cancer Father   ?     unknown type  ? Hypertension Sister   ? Hypertension Brother   ? Hyperlipidemia Brother   ? Breast cancer Maternal Grandmother   ?     likely breast ca - mastectomy  ? Hypertension Brother   ? Diabetes Brother   ? Hypertension Brother   ? Heart attack Neg Hx   ? Stroke Neg Hx   ? Colon cancer Neg Hx   ? Ovarian cancer Neg Hx   ? ? ?Social History  ? ?Socioeconomic History  ? Marital status: Married  ?  Spouse name: Not on file  ? Number of children: Not on file  ? Years of education: Not on file  ? Highest education level: Not on file  ?Occupational History  ? Not on file  ?Tobacco Use  ? Smoking status: Every Day  ?  Packs/day: 0.50  ?  Years: 1.00  ?  Pack years: 0.50  ?  Types: Cigarettes  ? Smokeless tobacco: Never  ? Tobacco comments:  ?  age [age] smoked 1/2 ppd, quit x 20 years, now smoking 1 year  ?Vaping Use  ? Vaping Use: Never used  ?Substance and Sexual Activity  ?  Alcohol use: No  ? Drug use: No  ? Sexual activity: Never  ?Other Topics Concern  ? Not on file  ?Social History Narrative  ? Not on file  ? ?Social Determinants of Health  ? ?Financial Resource Strain: Not on file  ?Food Insecurity: Not on file  ?Transportation Needs: Not on file  ?Physical Activity: Not on file  ?Stress: Not on file  ?Social Connections: Not on file  ?Intimate Partner Violence: Not on file  ? ? ? ?Constitutional: Denies fever, malaise, fatigue, headache or abrupt weight changes.  ?HEENT: Denies eye pain, eye redness, ear pain, ringing in the ears, wax buildup, runny nose, nasal congestion, bloody nose, or sore throat. ?Respiratory: Denies difficulty breathing, shortness of breath, cough or sputum production.   ?Cardiovascular: Patient reports chronic swelling in her lower extremities.  Denies chest pain, chest tightness, palpitations or swelling in the hands.  ?Gastrointestinal: Denies abdominal pain, bloating, constipation, diarrhea or blood in the stool.  ?GU: Denies urgency, frequency, pain with urination, burning sensation, blood in urine, odor or discharge. ?Musculoskeletal: Denies decrease in range of motion, difficulty with gait, muscle pain or joint pain and  swelling.  ?Skin: Denies redness, rashes, lesions or ulcercations.  ?Neurological: Denies dizziness, difficulty with memory, difficulty with speech or problems with balance and coordination.  ?Psych: Denies anxiety, depression, SI/HI. ? ?No other specific complaints in a complete review of systems (except as listed in HPI above). ? ?Objective:  ? Physical Exam ?BP (!) 158/96 (BP Location: Right Arm, Patient Position: Sitting, Cuff Size: Normal)   Pulse 82   Temp (!) 96.9 ?F (36.1 ?C) (Temporal)   Ht 5\' 11"  (1.803 m)   Wt 188 lb (85.3 kg)   LMP  (Approximate)   SpO2 100%   BMI 26.22 kg/m?  ? ?Wt Readings from Last 3 Encounters:  ?12/10/21 194 lb (88 kg)  ?12/03/21 180 lb (81.6 kg)  ?09/10/21 197 lb 3.2 oz (89.4 kg)  ? ? ?General:  Appears her stated age, overweight, in NAD. ?Skin: Warm, dry and intact.  ?HEENT: Head: normal shape and size; Eyes: sclera white, no icterus, conjunctiva pink, PERRLA and EOMs intact;  ?Neck:  Neck supple, trachea midline. No masses, lumps or thyromegaly present.  ?Cardiovascular: Normal rate and rhythm. S1,S2 noted.  No murmur, rubs or gallops noted.  1+ nonpitting BLE edema. No carotid bruits noted. ?Pulmonary/Chest: Normal effort and positive vesicular breath sounds. No respiratory distress. No wheezes, rales or ronchi noted.  ?Abdomen: Soft and nontender. Normal bowel sounds.  ?Pelvic: Normal female anatomy.  Cervix without mass or lesion.  No discharge or odor present.  No CMT.  Adnexa nonpalpable. ?Musculoskeletal: Strength 5/5 BUE/BLE.  No difficulty with gait.  ?Neurological: Alert and oriented. Cranial nerves II-XII grossly intact. Coordination normal.  ?Psychiatric: Mood and affect flat. Behavior is normal. Judgment and thought content normal.  ? ? ?BMET ?   ?Component Value Date/Time  ? NA 141 12/05/2021 0305  ? K 3.6 12/05/2021 0305  ? CL 108 12/05/2021 0305  ? CO2 27 12/05/2021 0305  ? GLUCOSE 105 (H) 12/05/2021 0305  ? BUN 15 12/05/2021 0305  ? CREATININE 0.85 12/05/2021 0305  ? CREATININE 0.75 06/09/2017 0832  ? CALCIUM 8.9 12/05/2021 0305  ? GFRNONAA >60 12/05/2021 0305  ? GFRNONAA 89 06/09/2017 0832  ? GFRAA >60 04/08/2019 0226  ? GFRAA 103 06/09/2017 0832  ? ? ?Lipid Panel  ?   ?Component Value Date/Time  ? CHOL 169 06/09/2017 0832  ? TRIG 52 06/09/2017 0832  ? HDL 63 06/09/2017 0832  ? CHOLHDL 2.7 06/09/2017 0832  ? LDLCALC 93 06/09/2017 0832  ? ? ?CBC ?   ?Component Value Date/Time  ? WBC 8.8 12/05/2021 0305  ? RBC 3.72 (L) 12/05/2021 0305  ? HGB 10.3 (L) 12/05/2021 0305  ? HCT 32.2 (L) 12/05/2021 0305  ? PLT 192 12/05/2021 0305  ? MCV 86.6 12/05/2021 0305  ? MCH 27.7 12/05/2021 0305  ? MCHC 32.0 12/05/2021 0305  ? RDW 15.3 12/05/2021 0305  ? LYMPHSABS 1,410 06/09/2017 0832  ? EOSABS 291  06/09/2017 0832  ? BASOSABS 41 06/09/2017 0832  ? ? ?Hgb A1C ?Lab Results  ?Component Value Date  ? HGBA1C 5.0 06/09/2017  ? ? ? ? ? ? ? ?   ?Assessment & Plan:  ? ?Preventative Health Maintenance: ? ?Encouraged her to get a flu shot in fall ?Tetanus UTD ?Encouraged her to get her COVID-vaccine ?Discussed Shingrix vaccine, she will check coverage with her insurance company and schedule a nurse visit she would like to have this done ?Pap smear today-she declines STD screening ?Mammogram and bone density ordered-she will call to schedule ?Colon  screening UTD ?Encouraged her to consume a balanced diet and exercise regimen ?Advised her to see an eye doctor and dentist annually ?We will check lipid and A1c today ? ?RTC in 2 weeks, follow-up HTN ?Webb Silversmith, NP ? ?

## 2022-01-16 NOTE — Assessment & Plan Note (Signed)
Encourage diet and exercise for weight loss 

## 2022-01-17 LAB — HEMOGLOBIN A1C
Hgb A1c MFr Bld: 5.3 % of total Hgb (ref <5.7)
Mean Plasma Glucose: 105 mg/dL
eAG (mmol/L): 5.8 mmol/L

## 2022-01-17 LAB — LIPID PANEL
Cholesterol: 198 mg/dL (ref <200)
HDL: 55 mg/dL (ref >=50)
LDL Cholesterol (Calc): 126 mg/dL (calc) — ABNORMAL HIGH
Non-HDL Cholesterol (Calc): 143 mg/dL (calc) — ABNORMAL HIGH (ref <130)
Total CHOL/HDL Ratio: 3.6 (calc) (ref <5.0)
Triglycerides: 73 mg/dL (ref <150)

## 2022-01-22 LAB — CYTOLOGY - PAP: Diagnosis: NEGATIVE

## 2022-01-30 ENCOUNTER — Ambulatory Visit: Payer: PRIVATE HEALTH INSURANCE | Admitting: Internal Medicine

## 2022-01-30 ENCOUNTER — Encounter: Payer: Self-pay | Admitting: Internal Medicine

## 2022-01-30 VITALS — BP 132/88 | HR 84 | Temp 96.9°F | Wt 185.0 lb

## 2022-01-30 DIAGNOSIS — E7800 Pure hypercholesterolemia, unspecified: Secondary | ICD-10-CM | POA: Diagnosis not present

## 2022-01-30 DIAGNOSIS — E663 Overweight: Secondary | ICD-10-CM

## 2022-01-30 DIAGNOSIS — I10 Essential (primary) hypertension: Secondary | ICD-10-CM | POA: Diagnosis not present

## 2022-01-30 DIAGNOSIS — Z6825 Body mass index (BMI) 25.0-25.9, adult: Secondary | ICD-10-CM

## 2022-01-30 MED ORDER — LOSARTAN POTASSIUM 25 MG PO TABS: 25.0000 mg | ORAL_TABLET | Freq: Every day | ORAL | 1 refills | Status: DC

## 2022-01-30 NOTE — Assessment & Plan Note (Signed)
Controlled on Losartan, refilled today Reinforced DASH diet and exercise for weight loss 

## 2022-01-30 NOTE — Patient Instructions (Signed)
Hypertension, Adult ?Hypertension is another name for high blood pressure. High blood pressure forces your heart to work harder to pump blood. This can cause problems over time. ?There are two numbers in a blood pressure reading. There is a top number (systolic) over a bottom number (diastolic). It is best to have a blood pressure that is below 120/80. ?What are the causes? ?The cause of this condition is not known. Some other conditions can lead to high blood pressure. ?What increases the risk? ?Some lifestyle factors can make you more likely to develop high blood pressure: ?Smoking. ?Not getting enough exercise or physical activity. ?Being overweight. ?Having too much fat, sugar, calories, or salt (sodium) in your diet. ?Drinking too much alcohol. ?Other risk factors include: ?Having any of these conditions: ?Heart disease. ?Diabetes. ?High cholesterol. ?Kidney disease. ?Obstructive sleep apnea. ?Having a family history of high blood pressure and high cholesterol. ?Age. The risk increases with age. ?Stress. ?What are the signs or symptoms? ?High blood pressure may not cause symptoms. Very high blood pressure (hypertensive crisis) may cause: ?Headache. ?Fast or uneven heartbeats (palpitations). ?Shortness of breath. ?Nosebleed. ?Vomiting or feeling like you may vomit (nauseous). ?Changes in how you see. ?Very bad chest pain. ?Feeling dizzy. ?Seizures. ?How is this treated? ?This condition is treated by making healthy lifestyle changes, such as: ?Eating healthy foods. ?Exercising more. ?Drinking less alcohol. ?Your doctor may prescribe medicine if lifestyle changes do not help enough and if: ?Your top number is above 130. ?Your bottom number is above 80. ?Your personal target blood pressure may vary. ?Follow these instructions at home: ?Eating and drinking ? ?If told, follow the DASH eating plan. To follow this plan: ?Fill one half of your plate at each meal with fruits and vegetables. ?Fill one fourth of your plate  at each meal with whole grains. Whole grains include whole-wheat pasta, brown rice, and whole-grain bread. ?Eat or drink low-fat dairy products, such as skim milk or low-fat yogurt. ?Fill one fourth of your plate at each meal with low-fat (lean) proteins. Low-fat proteins include fish, chicken without skin, eggs, beans, and tofu. ?Avoid fatty meat, cured and processed meat, or chicken with skin. ?Avoid pre-made or processed food. ?Limit the amount of salt in your diet to less than 1,500 mg each day. ?Do not drink alcohol if: ?Your doctor tells you not to drink. ?You are pregnant, may be pregnant, or are planning to become pregnant. ?If you drink alcohol: ?Limit how much you have to: ?0-1 drink a day for women. ?0-2 drinks a day for men. ?Know how much alcohol is in your drink. In the [place] one drink equals one 12 oz bottle of beer (355 mL), one 5 oz glass of wine (148 mL), or one 1? oz glass of hard liquor (44 mL). ?Lifestyle ? ?Work with your doctor to stay at a healthy weight or to lose weight. Ask your doctor what the best weight is for you. ?Get at least 30 minutes of exercise that causes your heart to beat faster (aerobic exercise) most days of the week. This may include walking, swimming, or biking. ?Get at least 30 minutes of exercise that strengthens your muscles (resistance exercise) at least 3 days a week. This may include lifting weights or doing Pilates. ?Do not smoke or use any products that contain nicotine or tobacco. If you need help quitting, ask your doctor. ?Check your blood pressure at home as told by your doctor. ?Keep all follow-up visits. ?Medicines ?Take over-the-counter and prescription medicines   only as told by your doctor. Follow directions carefully. ?Do not skip doses of blood pressure medicine. The medicine does not work as well if you skip doses. Skipping doses also puts you at risk for problems. ?Ask your doctor about side effects or reactions to medicines that you should watch  for. ?Contact a doctor if: ?You think you are having a reaction to the medicine you are taking. ?You have headaches that keep coming back. ?You feel dizzy. ?You have swelling in your ankles. ?You have trouble with your vision. ?Get help right away if: ?You get a very bad headache. ?You start to feel mixed up (confused). ?You feel weak or numb. ?You feel faint. ?You have very bad pain in your: ?Chest. ?Belly (abdomen). ?You vomit more than once. ?You have trouble breathing. ?These symptoms may be an emergency. Get help right away. Call 911. ?Do not wait to see if the symptoms will go away. ?Do not drive yourself to the hospital. ?Summary ?Hypertension is another name for high blood pressure. ?High blood pressure forces your heart to work harder to pump blood. ?For most people, a normal blood pressure is less than 120/80. ?Making healthy choices can help lower blood pressure. If your blood pressure does not get lower with healthy choices, you may need to take medicine. ?This information is not intended to replace advice given to you by your health care provider. Make sure you discuss any questions you have with your health care provider. ?Document Revised: 07/05/2021 Document Reviewed: 07/05/2021 ?Elsevier Patient Education ? 2023 Elsevier Inc. ? ?

## 2022-01-30 NOTE — Progress Notes (Signed)
? ?Subjective:  ? ? Patient ID: Amber Figueroa, female    DOB: March 17, 1961, 61 y.o.   MRN: [id number] ? ?HPI ? ?Patient presents to clinic today for follow-up of HTN.  At her last visit, her BP was elevated at 158/96.  She was started on Losartan 25 mg daily.  She has been taking the medication as prescribed and denies adverse side effects.  Her BP today is 132/88.  ECG from 06/2019 reviewed. ? ?Review of Systems ? ?   ?Past Medical History:  ?Diagnosis Date  ? Back pain   ? ? ?Current Outpatient Medications  ?Medication Sig Dispense Refill  ? losartan (COZAAR) 25 MG tablet Take 1 tablet (25 mg total) by mouth daily. 30 tablet 0  ? ?No current facility-administered medications for this visit.  ? ? ?No Known Allergies ? ?Family History  ?Problem Relation Age of Onset  ? Cancer Mother   ? Cancer Father   ?     unknown type  ? Hypertension Sister   ? Hypertension Brother   ? Hyperlipidemia Brother   ? Breast cancer Maternal Grandmother   ?     likely breast ca - mastectomy  ? Hypertension Brother   ? Diabetes Brother   ? Hypertension Brother   ? Heart attack Neg Hx   ? Stroke Neg Hx   ? Colon cancer Neg Hx   ? Ovarian cancer Neg Hx   ? ? ?Social History  ? ?Socioeconomic History  ? Marital status: Married  ?  Spouse name: Not on file  ? Number of children: Not on file  ? Years of education: Not on file  ? Highest education level: Not on file  ?Occupational History  ? Not on file  ?Tobacco Use  ? Smoking status: Every Day  ?  Packs/day: 0.50  ?  Years: 1.00  ?  Pack years: 0.50  ?  Types: Cigarettes  ? Smokeless tobacco: Never  ? Tobacco comments:  ?  age [age] smoked 1/2 ppd, quit x 20 years, now smoking 1 year  ?Vaping Use  ? Vaping Use: Never used  ?Substance and Sexual Activity  ? Alcohol use: No  ? Drug use: No  ? Sexual activity: Never  ?Other Topics Concern  ? Not on file  ?Social History Narrative  ? Not on file  ? ?Social Determinants of Health  ? ?Financial Resource Strain: Not on file  ?Food Insecurity: Not on  file  ?Transportation Needs: Not on file  ?Physical Activity: Not on file  ?Stress: Not on file  ?Social Connections: Not on file  ?Intimate Partner Violence: Not on file  ? ? ? ?Constitutional: Denies fever, malaise, fatigue, headache or abrupt weight changes.  ?HEENT: Denies eye pain, eye redness, ear pain, ringing in the ears, wax buildup, runny nose, nasal congestion, bloody nose, or sore throat. ?Respiratory: Denies difficulty breathing, shortness of breath, cough or sputum production.   ?Cardiovascular: Denies chest pain, chest tightness, palpitations or swelling in the hands or feet.  ?Neurological: Denies dizziness, difficulty with memory, difficulty with speech or problems with balance and coordination.  ? ? ?No other specific complaints in a complete review of systems (except as listed in HPI above). ? ?Objective:  ? Physical Exam ? ?BP 132/88 (BP Location: Left Arm, Patient Position: Sitting, Cuff Size: Large)   Pulse 84   Temp (!) 96.9 ?F (36.1 ?C) (Temporal)   Wt 185 lb (83.9 kg)   SpO2 100%   BMI 25.80 kg/m?  ? ?  Wt Readings from Last 3 Encounters:  ?01/16/22 188 lb (85.3 kg)  ?12/10/21 194 lb (88 kg)  ?12/03/21 180 lb (81.6 kg)  ? ? ?General: Appears her stated age,overweight, in NAD. ?HEENT: Head: normal shape and size; Eyes: sclera white, no icterus, conjunctiva pink, PERRLA and EOMs intact;  ?Cardiovascular: Normal rate and rhythm. S1,S2 noted.  No murmur, rubs or gallops noted ?Pulmonary/Chest: Normal effort and positive vesicular breath sounds. No respiratory distress. No wheezes, rales or ronchi noted.  ?Neurological: Alert and oriented.  ? ?BMET ?   ?Component Value Date/Time  ? NA 141 12/05/2021 0305  ? K 3.6 12/05/2021 0305  ? CL 108 12/05/2021 0305  ? CO2 27 12/05/2021 0305  ? GLUCOSE 105 (H) 12/05/2021 0305  ? BUN 15 12/05/2021 0305  ? CREATININE 0.85 12/05/2021 0305  ? CREATININE 0.75 06/09/2017 0832  ? CALCIUM 8.9 12/05/2021 0305  ? GFRNONAA >60 12/05/2021 0305  ? GFRNONAA 89  06/09/2017 0832  ? GFRAA >60 04/08/2019 0226  ? GFRAA 103 06/09/2017 0832  ? ? ?Lipid Panel  ?   ?Component Value Date/Time  ? CHOL 198 01/16/2022 0924  ? TRIG 73 01/16/2022 0924  ? HDL 55 01/16/2022 0924  ? CHOLHDL 3.6 01/16/2022 0924  ? LDLCALC 126 (H) 01/16/2022 1448  ? ? ?CBC ?   ?Component Value Date/Time  ? WBC 8.8 12/05/2021 0305  ? RBC 3.72 (L) 12/05/2021 0305  ? HGB 10.3 (L) 12/05/2021 0305  ? HCT 32.2 (L) 12/05/2021 0305  ? PLT 192 12/05/2021 0305  ? MCV 86.6 12/05/2021 0305  ? MCH 27.7 12/05/2021 0305  ? MCHC 32.0 12/05/2021 0305  ? RDW 15.3 12/05/2021 0305  ? LYMPHSABS 1,410 06/09/2017 0832  ? EOSABS 291 06/09/2017 0832  ? BASOSABS 41 06/09/2017 0832  ? ? ?Hgb A1C ?Lab Results  ?Component Value Date  ? HGBA1C 5.3 01/16/2022  ? ? ? ? ? ? ? ?   ?Assessment & Plan:  ? ?RTC in 6 months, follow-up chronic conditions ?Nicki Reaper, NP ? ?

## 2022-01-30 NOTE — Assessment & Plan Note (Signed)
Encouraged diet and exercise for weight loss ?

## 2022-02-10 ENCOUNTER — Emergency Department
Admission: EM | Admit: 2022-02-10 | Discharge: 2022-02-10 | Disposition: A | Discharge status: Home / Self Care | Payer: PRIVATE HEALTH INSURANCE | Attending: Emergency Medicine | Admitting: Emergency Medicine

## 2022-02-10 ENCOUNTER — Encounter: Payer: Self-pay | Admitting: Emergency Medicine

## 2022-02-10 ENCOUNTER — Emergency Department: Payer: PRIVATE HEALTH INSURANCE

## 2022-02-10 DIAGNOSIS — S8002XA Contusion of left knee, initial encounter: Secondary | ICD-10-CM | POA: Diagnosis not present

## 2022-02-10 DIAGNOSIS — W010XXA Fall on same level from slipping, tripping and stumbling without subsequent striking against object, initial encounter: Secondary | ICD-10-CM | POA: Insufficient documentation

## 2022-02-10 DIAGNOSIS — S8992XA Unspecified injury of left lower leg, initial encounter: Secondary | ICD-10-CM | POA: Diagnosis present

## 2022-02-10 DIAGNOSIS — Y93K1 Activity, walking an animal: Secondary | ICD-10-CM | POA: Diagnosis not present

## 2022-02-10 MED ORDER — MELOXICAM 7.5 MG PO TABS
15.0000 mg | ORAL_TABLET | Freq: Once | ORAL | Status: AC
  Administered 2022-02-10: 15 mg via ORAL
  Filled 2022-02-10: qty 2

## 2022-02-10 MED ORDER — MELOXICAM 15 MG PO TABS: 15.0000 mg | ORAL_TABLET | Freq: Every day | ORAL | 0 refills | Status: DC

## 2022-02-10 NOTE — ED Triage Notes (Signed)
Pt  here with c/o left knee pain after falling on it walking her dog , still able to walk but with a limp ?

## 2022-02-10 NOTE — ED Provider Notes (Signed)
[facility] Provider Note  Patient Contact: 11:04 PM (approximate)   History   Knee Pain   HPI  Amber Figueroa is a 61 y.o. female who presents the emergency department complaining of left knee pain.  Patient states that she was walking her dog when the dog started to run pulling her down.  She landed on her knees, more on her left than right.  She has some pain along the lateral joint line.  She did does not believe that she had any twisting with this fall.  Did not sustain any other injuries or complaints.  Still is able to ambulate on her legs at this time.     Physical Exam   Triage Vital Signs: ED Triage Vitals  Enc Vitals Group     BP 02/10/22 2141 (!) 169/102     Pulse Rate 02/10/22 2141 81     Resp 02/10/22 2141 18     Temp 02/10/22 2139 98.4 F (36.9 C)     Temp Source 02/10/22 2139 Oral     SpO2 02/10/22 2141 98 %     Weight 02/10/22 2139 185 lb (83.9 kg)     Height 02/10/22 2139 5\' 11"  (1.803 m)     Head Circumference --      Peak Flow --      Pain Score 02/10/22 2139 7     Pain Loc --      Pain Edu? --      Excl. in GC? --     Most recent vital signs: Vitals:   02/10/22 2139 02/10/22 2141  BP:  (!) 169/102  Pulse:  81  Resp:  18  Temp: 98.4 F (36.9 C)   SpO2:  98%     General: Alert and in no acute distress.  Cardiovascular:  Good peripheral perfusion Respiratory: Normal respiratory effort without tachypnea or retractions. Lungs CTAB.  Musculoskeletal: Full range of motion to all extremities.  Visualization of bilateral knees reveals slight edema along the lateral joint line of the left knee when compared with right.  No open wounds.  Patient has good range of motion.  Slightly tender along the lateral joint line without any other tenderness.  Varus, valgus, Lachman's and McMurray's is negative.  Dorsalis pedis pulses sensation intact distally. Neurologic:  No gross focal neurologic deficits are appreciated.  Skin:   No rash  noted Other:   ED Results / Procedures / Treatments   Labs (all labs ordered are listed, but only abnormal results are displayed) Labs Reviewed - No data to display   EKG     RADIOLOGY  I personally viewed, evaluated, and interpreted these images as part of my medical decision making, as well as reviewing the written report by the radiologist.  ED Provider Interpretation: Arthritic changes without acute fracture.  DG Knee Complete 4 Views Left  Result Date: 02/10/2022 CLINICAL DATA:  Status post trauma. EXAM: LEFT KNEE - COMPLETE 4+ VIEW COMPARISON:  June 02, 2017 FINDINGS: No evidence of fracture, dislocation, or joint effusion. Medial and lateral marginal osteophyte formation is seen. Mild to moderate severity medial and lateral tibiofemoral compartment space narrowing is noted. Soft tissues are unremarkable. IMPRESSION: 1. No acute osseous abnormality. 2. Mild to moderate severity degenerative changes. Electronically Signed   By: June 04, 2017 M.D.   On: 02/10/2022 22:04    PROCEDURES:  Critical Care performed: No  Procedures   MEDICATIONS ORDERED IN ED: Medications - No data to display   IMPRESSION /  MDM / ASSESSMENT AND PLAN / ED COURSE  I reviewed the triage vital signs and the nursing notes.                              Differential diagnosis includes, but is not limited to, knee contusion, fracture, patellar dislocation, torn meniscus, ligamentous injury   Patient's diagnosis is consistent with fall, knee contusion.  Patient presented to the emergency department after being pulled over by her dog.  Patient landed on her knees and was complaining of left knee pain.  X-rays reassuring with arthritic changes without acute abnormality.  Special tests of the knee were reassuring.  Patient will have anti-inflammatory for symptom relief.  Follow-up with primary care or orthopedics as needed. . Patient is given ED precautions to return to the ED for any worsening  or new symptoms.        FINAL CLINICAL IMPRESSION(S) / ED DIAGNOSES   Final diagnoses:  Contusion of left knee, initial encounter     Rx / DC Orders   ED Discharge Orders          Ordered    meloxicam (MOBIC) 15 MG tablet  Daily        02/10/22 2317             Note:  This document was prepared using Dragon voice recognition software and may include unintentional dictation errors.   Lanette Hampshire 02/10/22 2318    Shaune Pollack, MD 02/18/22 [phone]

## 2022-03-29 ENCOUNTER — Ambulatory Visit: Payer: Self-pay

## 2022-03-29 ENCOUNTER — Encounter: Payer: Self-pay | Admitting: Internal Medicine

## 2022-03-29 ENCOUNTER — Ambulatory Visit: Payer: PRIVATE HEALTH INSURANCE | Admitting: Internal Medicine

## 2022-03-29 VITALS — BP 146/70 | HR 90 | Temp 97.7°F | Wt 189.0 lb

## 2022-03-29 DIAGNOSIS — L03115 Cellulitis of right lower limb: Secondary | ICD-10-CM

## 2022-03-29 DIAGNOSIS — I890 Lymphedema, not elsewhere classified: Secondary | ICD-10-CM | POA: Diagnosis not present

## 2022-03-29 MED ORDER — CEPHALEXIN 500 MG PO CAPS: 500.0000 mg | ORAL_CAPSULE | Freq: Three times a day (TID) | ORAL | 0 refills | Status: DC

## 2022-03-29 NOTE — Patient Instructions (Signed)
Cellulitis, Adult  Cellulitis is a skin infection. The infected area is often warm, red, swollen, and sore. It occurs most often in the arms and lower legs. It is very important to get treated for this condition. What are the causes? This condition is caused by bacteria. The bacteria enter through a break in the skin, such as a cut, burn, insect bite, open sore, or crack. What increases the risk? This condition is more likely to occur in people who: Have a weak body defense system (immune system). Have open cuts, burns, bites, or scrapes on the skin. Are older than 62 years of age. Have a blood sugar problem (diabetes). Have a long-lasting (chronic) liver disease (cirrhosis) or kidney disease. Are very overweight (obese). Have a skin problem, such as: Itchy rash (eczema). Slow movement of blood in the veins (venous stasis). Fluid buildup below the skin (edema). Have been treated with high-energy rays (radiation). Use IV drugs. What are the signs or symptoms? Symptoms of this condition include: Skin that is: Red. Streaking. Spotting. Swollen. Sore or painful when you touch it. Warm. A fever. Chills. Blisters. How is this diagnosed? This condition is diagnosed based on: Medical history. Physical exam. Blood tests. Imaging tests. How is this treated? Treatment for this condition may include: Medicines to treat infections or allergies. Home care, such as: Rest. Placing cold or warm cloths (compresses) on the skin. Hospital care, if the condition is very bad. Follow these instructions at home: Medicines Take over-the-counter and prescription medicines only as told by your doctor. If you were prescribed an antibiotic medicine, take it as told by your doctor. Do not stop taking it even if you start to feel better. General instructions  Drink enough fluid to keep your pee (urine) pale yellow. Do not touch or rub the infected area. Raise (elevate) the infected area above  the level of your heart while you are sitting or lying down. Place cold or warm cloths on the area as told by your doctor. Keep all follow-up visits as told by your doctor. This is important. Contact a doctor if: You have a fever. You do not start to get better after 1-2 days of treatment. Your bone or joint under the infected area starts to hurt after the skin has healed. Your infection comes back. This can happen in the same area or another area. You have a swollen bump in the area. You have new symptoms. You feel ill and have muscle aches and pains. Get help right away if: Your symptoms get worse. You feel very sleepy. You throw up (vomit) or have watery poop (diarrhea) for a long time. You see red streaks coming from the area. Your red area gets larger. Your red area turns dark in color. These symptoms may represent a serious problem that is an emergency. Do not wait to see if the symptoms will go away. Get medical help right away. Call your local emergency services (911 in the [place] Do not drive yourself to the hospital. Summary Cellulitis is a skin infection. The area is often warm, red, swollen, and sore. This condition is treated with medicines, rest, and cold and warm cloths. Take all medicines only as told by your doctor. Tell your doctor if symptoms do not start to get better after 1-2 days of treatment. This information is not intended to replace advice given to you by your health care provider. Make sure you discuss any questions you have with your health care provider. Document Revised: 06/28/2021 Document   Reviewed: 06/28/2021 Elsevier Patient Education  2023 Elsevier Inc.  

## 2022-03-29 NOTE — Telephone Encounter (Signed)
  Chief Complaint: Infection right lower leg Symptoms: redness, swelling warmth. Frequency: Ongoing Pertinent Negatives: Patient denies  Disposition: [] ED /[] Urgent Care (no appt availability in office) / [x] Appointment(In office/virtual)/ []  [facility]/ [] Home Care/ [] Refused Recommended Disposition /[] [facility]/ []  Follow-up with PCP Additional Notes: Pt has been seen for this infection previously and it has now returned. Pt was given Keflex in the past. Infection had resolved. Answer Assessment - Initial Assessment Questions 1. INFECTION: "What infection is the antibiotic being given for?"     Leg infection 2. ANTIBIOTIC: "What antibiotic are you taking" "How many times per day?"     Keflex - now finished 3. DURATION: "When was the antibiotic started?"     april 4. MAIN CONCERN OR SYMPTOM:  "What is your main concern right now?"     Redness, pain hot 5. BETTER-SAME-WORSE: "Are you getting better, staying the same, or getting worse compared to when you first started the antibiotics?" If getting worse, ask: "In what way?"      worse 6. FEVER: "Do you have a fever?" If Yes, ask: "What is your temperature, how was it measured, and when did it start?"     unknown 7. SYMPTOMS: "Are there any other symptoms you're concerned about?" If Yes, ask: "When did it start?"     Leg infection. 8. FOLLOW-UP APPOINTMENT: "Do you have a follow-up appointment with your doctor?"     yes  Protocols used: Infection on Antibiotic Follow-up Call-[facility]

## 2022-03-29 NOTE — Progress Notes (Signed)
Subjective:    Patient ID: Amber Figueroa, female    DOB: 10/27/1960, 61 y.o.   MRN: [id number]  HPI  Patient presents to clinic today with complaint of pain, redness and swelling of her right lower extremity.  This started this morning she has a history of lymphedema with recurrent cellulitis in that extremity.  She has not tried anything OTC for this.  Review of Systems     Past Medical History:  Diagnosis Date   Back pain     Current Outpatient Medications  Medication Sig Dispense Refill   losartan (COZAAR) 25 MG tablet Take 1 tablet (25 mg total) by mouth daily. 90 tablet 1   meloxicam (MOBIC) 15 MG tablet Take 1 tablet (15 mg total) by mouth daily. 30 tablet 0   No current facility-administered medications for this visit.    No Known Allergies  Family History  Problem Relation Age of Onset   Cancer Mother    Cancer Father        unknown type   Hypertension Sister    Hypertension Brother    Hyperlipidemia Brother    Breast cancer Maternal Grandmother        likely breast ca - mastectomy   Hypertension Brother    Diabetes Brother    Hypertension Brother    Heart attack Neg Hx    Stroke Neg Hx    Colon cancer Neg Hx    Ovarian cancer Neg Hx     Social History   Socioeconomic History   Marital status: Married    Spouse name: Not on file   Number of children: Not on file   Years of education: Not on file   Highest education level: Not on file  Occupational History   Not on file  Tobacco Use   Smoking status: Every Day    Packs/day: 0.50    Years: 1.00    Total pack years: 0.50    Types: Cigarettes   Smokeless tobacco: Never   Tobacco comments:    age [age] smoked 1/2 ppd, quit x 20 years, now smoking 1 year  Vaping Use   Vaping Use: Never used  Substance and Sexual Activity   Alcohol use: No   Drug use: No   Sexual activity: Never  Other Topics Concern   Not on file  Social History Narrative   Not on file   Social Determinants of Health    Financial Resource Strain: Not on file  Food Insecurity: Not on file  Transportation Needs: Not on file  Physical Activity: Not on file  Stress: Not on file  Social Connections: Not on file  Intimate Partner Violence: Not on file     Constitutional: Denies fever, malaise, fatigue, headache or abrupt weight changes.  Cardiovascular: Patient reports swelling of BLE.  Denies chest pain, chest tightness, palpitations or swelling in the hands .  Musculoskeletal: Denies decrease in range of motion, difficulty with gait, muscle pain or joint pain and swelling.  Skin: Patient reports redness, warmth and pain to her right lower extremity.  Denies lesions or ulcercations.    No other specific complaints in a complete review of systems (except as listed in HPI above).  Objective:   Physical Exam  BP (!) 146/70   Pulse 90   Temp 97.7 F (36.5 C) (Temporal)   Wt 189 lb (85.7 kg)   SpO2 100%   BMI 26.36 kg/m   Wt Readings from Last 3 Encounters:  02/10/22 185 lb (83.9  kg)  01/30/22 185 lb (83.9 kg)  01/16/22 188 lb (85.3 kg)    General: Appears her stated age, overweight, in NAD. Skin: Warm, dry and intact.  Redness, warmth and tenderness noted of the right anterior shin. Cardiovascular: Normal rate and rhythm.  Pulmonary/Chest: Normal effort and positive vesicular breath sounds. No respiratory distress. No wheezes, rales or ronchi noted.  Musculoskeletal: 1+ swelling BLE.  No difficulty with gait.  Neurological: Alert and oriented.   BMET    Component Value Date/Time   NA 141 12/05/2021 0305   K 3.6 12/05/2021 0305   CL 108 12/05/2021 0305   CO2 27 12/05/2021 0305   GLUCOSE 105 (H) 12/05/2021 0305   BUN 15 12/05/2021 0305   CREATININE 0.85 12/05/2021 0305   CREATININE 0.75 06/09/2017 0832   CALCIUM 8.9 12/05/2021 0305   GFRNONAA >60 12/05/2021 0305   GFRNONAA 89 06/09/2017 0832   GFRAA >60 04/08/2019 0226   GFRAA 103 06/09/2017 0832    Lipid Panel     Component  Value Date/Time   CHOL 198 01/16/2022 0924   TRIG 73 01/16/2022 0924   HDL 55 01/16/2022 0924   CHOLHDL 3.6 01/16/2022 0924   LDLCALC 126 (H) 01/16/2022 0924    CBC    Component Value Date/Time   WBC 8.8 12/05/2021 0305   RBC 3.72 (L) 12/05/2021 0305   HGB 10.3 (L) 12/05/2021 0305   HCT 32.2 (L) 12/05/2021 0305   PLT 192 12/05/2021 0305   MCV 86.6 12/05/2021 0305   MCH 27.7 12/05/2021 0305   MCHC 32.0 12/05/2021 0305   RDW 15.3 12/05/2021 0305   LYMPHSABS 1,410 06/09/2017 0832   EOSABS 291 06/09/2017 0832   BASOSABS 41 06/09/2017 0832    Hgb A1C Lab Results  Component Value Date   HGBA1C 5.3 01/16/2022            Assessment & Plan:  Recurrent Cellulitis RLE:  Rx for Keflex 500 mg 3 times daily x10 days Encourage elevation Warm compresses for 10 minutes 3 times daily may be helpful Continue to follow with the lymphedema clinic   RTC in 3 months for follow-up of chronic conditions Nicki Reaper, NP

## 2022-04-23 ENCOUNTER — Other Ambulatory Visit: Payer: Self-pay | Admitting: Internal Medicine

## 2022-04-24 NOTE — Telephone Encounter (Signed)
Requested medication (s) are due for refill today: n/a  Requested medication (s) are on the active medication list: yes  Last refill:  03/29/22  Future visit scheduled: yes  Notes to clinic:  attempted to call pt but mailbox full   Requested Prescriptions  Pending Prescriptions Disp Refills   cephALEXin (KEFLEX) 500 MG capsule [Pharmacy Med Name: CEPHALEXIN 500MG  CAPSULES] 30 capsule 0    Sig: TAKE 1 CAPSULE(500 MG) BY MOUTH THREE TIMES DAILY     Off-Protocol Failed - 04/23/2022  1:22 PM      Failed - Medication not assigned to a protocol, review manually.      Passed - Valid encounter within last 12 months    Recent Outpatient Visits           3 weeks ago Cellulitis of right lower extremity   [facility] [place], Mullins, NP   2 months ago Pure hypercholesterolemia   [facility] [place], Mullins, NP   3 months ago Encounter for general adult medical examination with abnormal findings   [facility] [place], Mullins, NP   4 months ago Cellulitis of right lower leg   [facility] [place], Mullins, NP   7 months ago Cellulitis of right lower extremity   [facility] [place], Mullins, NP       Future Appointments             In 3 months Baity, Salvadore Oxford, NP [facility], [facility]

## 2022-05-15 ENCOUNTER — Other Ambulatory Visit (INDEPENDENT_AMBULATORY_CARE_PROVIDER_SITE_OTHER): Payer: Self-pay | Admitting: Nurse Practitioner

## 2022-05-15 DIAGNOSIS — I890 Lymphedema, not elsewhere classified: Secondary | ICD-10-CM

## 2022-05-16 ENCOUNTER — Ambulatory Visit (INDEPENDENT_AMBULATORY_CARE_PROVIDER_SITE_OTHER): Payer: PRIVATE HEALTH INSURANCE

## 2022-05-16 ENCOUNTER — Encounter (INDEPENDENT_AMBULATORY_CARE_PROVIDER_SITE_OTHER): Payer: Self-pay | Admitting: Nurse Practitioner

## 2022-05-16 ENCOUNTER — Ambulatory Visit (INDEPENDENT_AMBULATORY_CARE_PROVIDER_SITE_OTHER): Payer: PRIVATE HEALTH INSURANCE | Admitting: Nurse Practitioner

## 2022-05-16 VITALS — BP 153/85 | HR 74 | Resp 16 | Wt 181.0 lb

## 2022-05-16 DIAGNOSIS — I872 Venous insufficiency (chronic) (peripheral): Secondary | ICD-10-CM

## 2022-05-16 DIAGNOSIS — I890 Lymphedema, not elsewhere classified: Secondary | ICD-10-CM | POA: Diagnosis not present

## 2022-05-16 DIAGNOSIS — I10 Essential (primary) hypertension: Secondary | ICD-10-CM | POA: Diagnosis not present

## 2022-06-03 ENCOUNTER — Encounter (INDEPENDENT_AMBULATORY_CARE_PROVIDER_SITE_OTHER): Payer: Self-pay | Admitting: Nurse Practitioner

## 2022-06-03 NOTE — Progress Notes (Addendum)
Subjective:    Patient ID: Amber Figueroa, female    DOB: 1960-10-18, 61 y.o.   MRN: [id number] Chief Complaint  Patient presents with   New Patient (Initial Visit)    Ref [facility] consult lymphedema     The patient presents to [place] for evaluation of her lower extremity edema and known lymphedema.  The patient previously had an episode of cellulitis and had to present to the ED for treatment.  Since that time she has had recurrent episodes of redness with pain and discomfort in the right lower extremity.  Today noninvasive study showed no evidence of DVT or superficial thrombophlebitis bilaterally.  No evidence of deep venous insufficiency or superficial venous reflux is noted bilaterally.    Review of Systems  Cardiovascular:  Positive for leg swelling.  All other systems reviewed and are negative.      Objective:   Physical Exam Vitals reviewed.  HENT:     Head: Normocephalic.  Cardiovascular:     Rate and Rhythm: Normal rate.  Pulmonary:     Effort: Pulmonary effort is normal.  Skin:    General: Skin is warm and dry.  Neurological:     Mental Status: She is alert and oriented to person, place, and time.  Psychiatric:        Mood and Affect: Mood normal.        Behavior: Behavior normal.        Thought Content: Thought content normal.        Judgment: Judgment normal.     BP (!) 153/85 (BP Location: Left Arm)   Pulse 74   Resp 16   Wt 181 lb (82.1 kg)   BMI 25.24 kg/m   Past Medical History:  Diagnosis Date   Back pain    Hypertension     Social History   Socioeconomic History   Marital status: Married    Spouse name: Not on file   Number of children: Not on file   Years of education: Not on file   Highest education level: Not on file  Occupational History   Not on file  Tobacco Use   Smoking status: Every Day    Packs/day: 0.50    Years: 1.00    Total pack years: 0.50    Types: Cigarettes   Smokeless tobacco: Never   Tobacco comments:    age [age]  smoked 1/2 ppd, quit x 20 years, now smoking 1 year  Vaping Use   Vaping Use: Never used  Substance and Sexual Activity   Alcohol use: No   Drug use: No   Sexual activity: Never  Other Topics Concern   Not on file  Social History Narrative   Not on file   Social Determinants of Health   Financial Resource Strain: Not on file  Food Insecurity: Not on file  Transportation Needs: Not on file  Physical Activity: Not on file  Stress: Not on file  Social Connections: Not on file  Intimate Partner Violence: Not on file    Past Surgical History:  Procedure Laterality Date   COLONOSCOPY WITH PROPOFOL N/A 07/21/2017   Procedure: COLONOSCOPY WITH PROPOFOL;  Surgeon: Wyline Mood, MD;  Location: [facility] ENDOSCOPY;  Service: Gastroenterology;  Laterality: N/A;   KNEE SURGERY      Family History  Problem Relation Age of Onset   Cancer Mother    Cancer Father        unknown type   Hypertension Sister    Hypertension Brother  Hyperlipidemia Brother    Breast cancer Maternal Grandmother        likely breast ca - mastectomy   Hypertension Brother    Diabetes Brother    Hypertension Brother    Heart attack Neg Hx    Stroke Neg Hx    Colon cancer Neg Hx    Ovarian cancer Neg Hx     No Known Allergies     Latest Ref Rng & Units 12/05/2021    3:05 AM 12/04/2021    4:14 AM 12/03/2021    6:22 AM  CBC  WBC 4.0 - 10.5 K/uL 8.8  13.1  12.3   Hemoglobin 12.0 - 15.0 g/dL 82.4  23.5  36.1   Hematocrit 36.0 - 46.0 % 32.2  32.3  38.8   Platelets 150 - 400 K/uL 192  205  231       CMP     Component Value Date/Time   NA 141 12/05/2021 0305   K 3.6 12/05/2021 0305   CL 108 12/05/2021 0305   CO2 27 12/05/2021 0305   GLUCOSE 105 (H) 12/05/2021 0305   BUN 15 12/05/2021 0305   CREATININE 0.85 12/05/2021 0305   CREATININE 0.75 06/09/2017 0832   CALCIUM 8.9 12/05/2021 0305   PROT 7.4 12/03/2021 0622   ALBUMIN 3.7 12/03/2021 0622   AST 22 12/03/2021 0622   ALT 12 12/03/2021 0622    ALKPHOS 64 12/03/2021 0622   BILITOT 0.9 12/03/2021 0622   GFRNONAA >60 12/05/2021 0305   GFRNONAA 89 06/09/2017 0832   GFRAA >60 04/08/2019 0226   GFRAA 103 06/09/2017 0832     No results found.     Assessment & Plan:   1. Lymphedema Recommend:  I have had a long discussion with the patient regarding swelling and why it  causes symptoms.  Patient will begin wearing graduated compression on a daily basis a prescription was given. The patient will  wear the stockings first thing in the morning and removing them in the evening. The patient is instructed specifically not to sleep in the stockings.   In addition, behavioral modification will be initiated.  This will include frequent elevation, use of over the counter pain medications and exercise such as walking.  Consideration for a lymph pump will also be made based upon the effectiveness of conservative therapy.  This would help to improve the edema control and prevent sequela such as ulcers and infections  Patient will follow-up in 3 months 2. Primary hypertension Continue antihypertensive medications as already ordered, these medications have been reviewed and there are no changes at this time.   3. Chronic venous insufficiency Currently no evidence of chronic venous insufficiency noted   Current Outpatient Medications on File Prior to Visit  Medication Sig Dispense Refill   cephALEXin (KEFLEX) 500 MG capsule Take 1 capsule (500 mg total) by mouth 3 (three) times daily. (Patient taking differently: Take 500 mg by mouth as needed.) 30 capsule 0   losartan (COZAAR) 25 MG tablet Take 1 tablet (25 mg total) by mouth daily. 90 tablet 1   meloxicam (MOBIC) 15 MG tablet Take 1 tablet (15 mg total) by mouth daily. (Patient not taking: Reported on 05/16/2022) 30 tablet 0   No current facility-administered medications on file prior to visit.    There are no Patient Instructions on file for this visit. No follow-ups on file.   Georgiana Spinner, NP

## 2022-06-18 ENCOUNTER — Ambulatory Visit: Payer: Self-pay | Admitting: *Deleted

## 2022-06-18 ENCOUNTER — Telehealth: Payer: PRIVATE HEALTH INSURANCE | Admitting: Physician Assistant

## 2022-06-18 DIAGNOSIS — U071 COVID-19: Secondary | ICD-10-CM | POA: Diagnosis not present

## 2022-06-18 MED ORDER — BENZONATATE 100 MG PO CAPS: 100.0000 mg | ORAL_CAPSULE | Freq: Three times a day (TID) | ORAL | 0 refills | Status: DC | PRN

## 2022-06-18 MED ORDER — NIRMATRELVIR/RITONAVIR (PAXLOVID)TABLET: 3.0000 | ORAL_TABLET | Freq: Two times a day (BID) | ORAL | 0 refills | Status: AC

## 2022-06-18 MED ORDER — ALBUTEROL SULFATE HFA 108 (90 BASE) MCG/ACT IN AERS: 2.0000 | INHALATION_SPRAY | Freq: Four times a day (QID) | RESPIRATORY_TRACT | 0 refills | Status: DC | PRN

## 2022-06-18 NOTE — Telephone Encounter (Signed)
  Chief Complaint: covid positive, shortness of breath requesting medication  Symptoms: covid positive at home test Monday 06/17/22 sx started 06/15/22. C/o shortness of breath, tires easily, diarrhea, no taste or smell since Saturday.  Frequency: 06/15/22 Pertinent Negatives: Patient denies chest pain no difficulty breathing, no fever, no cough no runny nose no sore throat  Disposition: [] ED /[] Urgent Care (no appt availability in office) / [] Appointment(In office/virtual)/ [x]  [facility]/ [] Home Care/ [] Refused Recommended Disposition /[] [facility]/ []  Follow-up with PCP Additional Notes:   No available appt my chart VV or OV today. Patient able to schedule self for UC VV.     Reason for Disposition  [1] HIGH RISK patient (e.g., weak immune system, age > 27 years, obesity with BMI 30 or higher, pregnant, chronic lung disease or other chronic medical condition) AND [2] COVID symptoms (e.g., cough, fever)  (Exceptions: Already seen by PCP and no new or worsening symptoms.)  Answer Assessment - Initial Assessment Questions 1. COVID-19 DIAGNOSIS: "How do you know that you have COVID?" (e.g., positive lab test or self-test, diagnosed by doctor or NP/PA, symptoms after exposure).     Covid positive test Monday at home test  2. COVID-19 EXPOSURE: "Was there any known exposure to COVID before the symptoms began?" CDC Definition of close contact: within 6 feet (2 meters) for a total of 15 minutes or more over a 24-hour period.      na 3. ONSET: "When did the COVID-19 symptoms start?"      Friday 06/14/22 4. WORST SYMPTOM: "What is your worst symptom?" (e.g., cough, fever, shortness of breath, muscle aches)     Body aches , shortness of breath 5. COUGH: "Do you have a cough?" If Yes, ask: "How bad is the cough?"       no 6. FEVER: "Do you have a fever?" If Yes, ask: "What is your temperature, how was it measured, and when did it start?"     no 7. RESPIRATORY STATUS: "Describe  your breathing?" (e.g., normal; shortness of breath, wheezing, unable to speak)      Shortness of breath  8. BETTER-SAME-WORSE: "Are you getting better, staying the same or getting worse compared to yesterday?"  If getting worse, ask, "In what way?"     na 9. OTHER SYMPTOMS: "Do you have any other symptoms?"  (e.g., chills, fatigue, headache, loss of smell or taste, muscle pain, sore throat)     Fatigue no taste or smell, body aches  10. HIGH RISK DISEASE: "Do you have any chronic medical problems?" (e.g., asthma, heart or lung disease, weak immune system, obesity, etc.)       Hx HTN 11. VACCINE: "Have you had the COVID-19 vaccine?" If Yes, ask: "Which one, how many shots, when did you get it?"       Yes  1st series  12. PREGNANCY: "Is there any chance you are pregnant?" "When was your last menstrual period?"       na 13. O2 SATURATION MONITOR:  "Do you use an oxygen saturation monitor (pulse oximeter) at home?" If Yes, ask "What is your reading (oxygen level) today?" "What is your usual oxygen saturation reading?" (e.g., 95%)       na  Protocols used: Coronavirus (COVID-19) Diagnosed or Suspected-A-AH

## 2022-06-18 NOTE — Telephone Encounter (Signed)
FYI

## 2022-06-18 NOTE — Progress Notes (Signed)
Virtual Visit Consent   Amber Figueroa, you are scheduled for a virtual visit with a [facility] provider today. Just as with appointments in the office, your consent must be obtained to participate. Your consent will be active for this visit and any virtual visit you may have with one of our providers in the next 365 days. If you have a MyChart account, a copy of this consent can be sent to you electronically.  As this is a virtual visit, video technology does not allow for your provider to perform a traditional examination. This may limit your provider's ability to fully assess your condition. If your provider identifies any concerns that need to be evaluated in person or the need to arrange testing (such as labs, EKG, etc.), we will make arrangements to do so. Although advances in technology are sophisticated, we cannot ensure that it will always work on either your end or our end. If the connection with a video visit is poor, the visit may have to be switched to a telephone visit. With either a video or telephone visit, we are not always able to ensure that we have a secure connection.  By engaging in this virtual visit, you consent to the provision of healthcare and authorize for your insurance to be billed (if applicable) for the services provided during this visit. Depending on your insurance coverage, you may receive a charge related to this service.  I need to obtain your verbal consent now. Are you willing to proceed with your visit today? Amber Figueroa has provided verbal consent on 06/18/2022 for a virtual visit (video or telephone). Amber Figueroa, [place]  Date: 06/18/2022 2:39 PM  Virtual Visit via Video Note   I, Amber Figueroa, connected with  Kaytie Ratcliffe  ([id number], [birth year]) on 06/18/22 at  2:30 PM EDT by a video-enabled telemedicine application and verified that I am speaking with the correct person using two identifiers.  Location: Patient: Virtual Visit Location  Patient: Home Provider: Virtual Visit Location Provider: Home Office   I discussed the limitations of evaluation and management by telemedicine and the availability of in person appointments. The patient expressed understanding and agreed to proceed.    History of Present Illness: Amber Figueroa is a 61 y.o. who identifies as a female who was assigned female at birth, and is being seen today for COVID-19. Endorses symptoms starting Friday night into Saturday morning with some fatigue and scratchy throat. Since that time has developed aches, chills, head congestion, full-blown sore throat, cough and some shortness of breath with exertion. Denies chest pain. Has some loose stool but better today. Called  PCP office and was sent to triage. No appt available so put on our schedule for evaluation and management. [name]  HPI: HPI  Problems:  Patient Active Problem List   Diagnosis Date Noted   Primary hypertension 01/16/2022   Normocytic anemia 12/05/2021   Overweight with body mass index (BMI) of 25 to 25.9 in adult 05/22/2021   Chronic venous insufficiency 05/22/2021    Allergies: No Known Allergies Medications:  Current Outpatient Medications:    albuterol (VENTOLIN HFA) 108 (90 Base) MCG/ACT inhaler, Inhale 2 puffs into the lungs every 6 (six) hours as needed for wheezing or shortness of breath., Disp: 8 g, Rfl: 0   benzonatate (TESSALON) 100 MG capsule, Take 1 capsule (100 mg total) by mouth 3 (three) times daily as needed for cough., Disp: 30 capsule, Rfl: 0   nirmatrelvir/ritonavir EUA (PAXLOVID) 20 x 150 MG &  10 x 100MG  TABS, Take 3 tablets by mouth 2 (two) times daily for 5 days. (Take nirmatrelvir 150 mg two tablets twice daily for 5 days and ritonavir 100 mg one tablet twice daily for 5 days) Patient GFR is 60, Disp: 30 tablet, Rfl: 0   losartan (COZAAR) 25 MG tablet, Take 1 tablet (25 mg total) by mouth daily., Disp: 90 tablet, Rfl: 1  Observations/Objective: Patient is well-developed,  well-nourished in no acute distress.  Resting comfortably at home.  Head is normocephalic, atraumatic.  No labored breathing. Speech is clear and coherent with logical content.  Patient is alert and oriented at baseline.   Assessment and Plan: 1. COVID-19 - MyChart COVID-19 home monitoring program; Future - benzonatate (TESSALON) 100 MG capsule; Take 1 capsule (100 mg total) by mouth 3 (three) times daily as needed for cough.  Dispense: 30 capsule; Refill: 0 - albuterol (VENTOLIN HFA) 108 (90 Base) MCG/ACT inhaler; Inhale 2 puffs into the lungs every 6 (six) hours as needed for wheezing or shortness of breath.  Dispense: 8 g; Refill: 0 - nirmatrelvir/ritonavir EUA (PAXLOVID) 20 x 150 MG & 10 x 100MG  TABS; Take 3 tablets by mouth 2 (two) times daily for 5 days. (Take nirmatrelvir 150 mg two tablets twice daily for 5 days and ritonavir 100 mg one tablet twice daily for 5 days) Patient GFR is 60  Dispense: 30 tablet; Refill: 0  Patient with multiple risk factors for complicated course of illness. Discussed risks/benefits of antiviral medications including most common potential ADRs. Patient voiced understanding and would like to proceed with antiviral medication. They are candidate for Paxlovid giving normal Cr and GFR > 60. Rx sent to pharmacy. Supportive measures, OTC medications and vitamin regimen reviewed. Tessalon and Albuterol per orders. Patient has been enrolled in a MyChart COVID symptom monitoring program. Samule Dry reviewed in detail. Strict ER precautions discussed with patient.    Follow Up Instructions: I discussed the assessment and treatment plan with the patient. The patient was provided an opportunity to ask questions and all were answered. The patient agreed with the plan and demonstrated an understanding of the instructions.  A copy of instructions were sent to the patient via MyChart unless otherwise noted below.   The patient was advised to call back or seek an in-person  evaluation if the symptoms worsen or if the condition fails to improve as anticipated.  Time:  I spent 10 minutes with the patient via telehealth technology discussing the above problems/concerns.    Amber Rio, PA-C

## 2022-06-18 NOTE — Patient Instructions (Addendum)
Amber RoughenIrene Figueroa, thank you for joining Amber ClimesWilliam Cody Paydon Carll, PA-C for today's virtual visit.  While this provider is not your primary care provider (PCP), if your PCP is located in our provider database this encounter information will be shared with them immediately following your visit.  Consent: (Patient) Amber Roughenrene Waldroup provided verbal consent for this virtual visit at the beginning of the encounter.  Current Medications:  Current Outpatient Medications:    losartan (COZAAR) 25 MG tablet, Take 1 tablet (25 mg total) by mouth daily., Disp: 90 tablet, Rfl: 1   Medications ordered in this encounter:  No orders of the defined types were placed in this encounter.   *If you need refills on other medications prior to your next appointment, please contact your pharmacy*  Follow-Up: Call back or seek an in-person evaluation if the symptoms worsen or if the condition fails to improve as anticipated.  Other Instructions Please keep well-hydrated and get plenty of rest. Start a saline nasal rinse to flush out your nasal passages. You can use plain Mucinex to help thin congestion. If you have a humidifier, running in the bedroom at night. I want you to start OTC vitamin D3 1000 units daily, vitamin C 1000 mg daily, and a zinc supplement. Please take prescribed medications as directed.  You have been enrolled in a MyChart symptom monitoring program. Please answer these questions daily so we can keep track of how you are doing.  You were to quarantine for 5 days from onset of your symptoms.  After day 5, if you have had no fever and you are feeling better, you can end quarantine but need to mask for an additional 5 days. After day 5 if you have a fever or are having significant symptoms, please quarantine for full 10 days.  If you note any worsening of symptoms, any significant shortness of breath or any chest pain, please seek ER evaluation ASAP.  Please do not delay care!  COVID-19: What to Do if  You Are Sick If you test positive and are an older adult or someone who is at high risk of getting very sick from COVID-19, treatment may be available. Contact a healthcare provider right away after a positive test to determine if you are eligible, even if your symptoms are mild right now. You can also visit a Test to Treat location and, if eligible, receive a prescription from a provider. Don't delay: Treatment must be started within the first few days to be effective. If you have a fever, cough, or other symptoms, you might have COVID-19. Most people have mild illness and are able to recover at home. If you are sick: Keep track of your symptoms. If you have an emergency warning sign (including trouble breathing), call 911. Steps to help prevent the spread of COVID-19 if you are sick If you are sick with COVID-19 or think you might have COVID-19, follow the steps below to care for yourself and to help protect other people in your home and community. Stay home except to get medical care Stay home. Most people with COVID-19 have mild illness and can recover at home without medical care. Do not leave your home, except to get medical care. Do not visit public areas and do not go to places where you are unable to wear a mask. Take care of yourself. Get rest and stay hydrated. Take over-the-counter medicines, such as acetaminophen, to help you feel better. Stay in touch with your doctor. Call before you get medical care.  Be sure to get care if you have trouble breathing, or have any other emergency warning signs, or if you think it is an emergency. Avoid public transportation, ride-sharing, or taxis if possible. Get tested If you have symptoms of COVID-19, get tested. While waiting for test results, stay away from others, including staying apart from those living in your household. Get tested as soon as possible after your symptoms start. Treatments may be available for people with COVID-19 who are at risk  for becoming very sick. Don't delay: Treatment must be started early to be effective--some treatments must begin within 5 days of your first symptoms. Contact your healthcare provider right away if your test result is positive to determine if you are eligible. Self-tests are one of several options for testing for the virus that causes COVID-19 and may be more convenient than laboratory-based tests and point-of-care tests. Ask your healthcare provider or your local health department if you need help interpreting your test results. You can visit your state, tribal, local, and territorial health department's website to look for the latest local information on testing sites. Separate yourself from other people As much as possible, stay in a specific room and away from other people and pets in your home. If possible, you should use a separate bathroom. If you need to be around other people or animals in or outside of the home, wear a well-fitting mask. Tell your close contacts that they may have been exposed to COVID-19. An infected person can spread COVID-19 starting 48 hours (or 2 days) before the person has any symptoms or tests positive. By letting your close contacts know they may have been exposed to COVID-19, you are helping to protect everyone. See COVID-19 and Animals if you have questions about pets. If you are diagnosed with COVID-19, someone from the health department may call you. Answer the call to slow the spread. Monitor your symptoms Symptoms of COVID-19 include fever, cough, or other symptoms. Follow care instructions from your healthcare provider and local health department. Your local health authorities may give instructions on checking your symptoms and reporting information. When to seek emergency medical attention Look for emergency warning signs* for COVID-19. If someone is showing any of these signs, seek emergency medical care immediately: Trouble breathing Persistent pain or  pressure in the chest New confusion Inability to wake or stay awake Pale, gray, or blue-colored skin, lips, or nail beds, depending on skin tone *This list is not all possible symptoms. Please call your medical provider for any other symptoms that are severe or concerning to you. Call 911 or call ahead to your local emergency facility: Notify the operator that you are seeking care for someone who has or may have COVID-19. Call ahead before visiting your doctor Call ahead. Many medical visits for routine care are being postponed or done by phone or telemedicine. If you have a medical appointment that cannot be postponed, call your doctor's office, and tell them you have or may have COVID-19. This will help the office protect themselves and other patients. If you are sick, wear a well-fitting mask You should wear a mask if you must be around other people or animals, including pets (even at home). Wear a mask with the best fit, protection, and comfort for you. You don't need to wear the mask if you are alone. If you can't put on a mask (because of trouble breathing, for example), cover your coughs and sneezes in some other way. Try to stay at  least 6 feet away from other people. This will help protect the people around you. Masks should not be placed on young children under age 83 years, anyone who has trouble breathing, or anyone who is not able to remove the mask without help. Cover your coughs and sneezes Cover your mouth and nose with a tissue when you cough or sneeze. Throw away used tissues in a lined trash can. Immediately wash your hands with soap and water for at least 20 seconds. If soap and water are not available, clean your hands with an alcohol-based hand sanitizer that contains at least 60% alcohol. Clean your hands often Wash your hands often with soap and water for at least 20 seconds. This is especially important after blowing your nose, coughing, or sneezing; going to the bathroom;  and before eating or preparing food. Use hand sanitizer if soap and water are not available. Use an alcohol-based hand sanitizer with at least 60% alcohol, covering all surfaces of your hands and rubbing them together until they feel dry. Soap and water are the best option, especially if hands are visibly dirty. Avoid touching your eyes, nose, and mouth with unwashed hands. Handwashing Tips Avoid sharing personal household items Do not share dishes, drinking glasses, cups, eating utensils, towels, or bedding with other people in your home. Wash these items thoroughly after using them with soap and water or put in the dishwasher. Clean surfaces in your home regularly Clean and disinfect high-touch surfaces (for example, doorknobs, tables, handles, light switches, and countertops) in your "sick room" and bathroom. In shared spaces, you should clean and disinfect surfaces and items after each use by the person who is ill. If you are sick and cannot clean, a caregiver or other person should only clean and disinfect the area around you (such as your bedroom and bathroom) on an as needed basis. Your caregiver/other person should wait as long as possible (at least several hours) and wear a mask before entering, cleaning, and disinfecting shared spaces that you use. Clean and disinfect areas that may have blood, stool, or body fluids on them. Use household cleaners and disinfectants. Clean visible dirty surfaces with household cleaners containing soap or detergent. Then, use a household disinfectant. Use a product from [employer] List N: Disinfectants for Coronavirus (COVID-19). Be sure to follow the instructions on the label to ensure safe and effective use of the product. Many products recommend keeping the surface wet with a disinfectant for a certain period of time (look at "contact time" on the product label). You may also need to wear personal protective equipment, such as gloves, depending on the directions  on the product label. Immediately after disinfecting, wash your hands with soap and water for 20 seconds. For completed guidance on cleaning and disinfecting your home, visit Complete Disinfection Guidance. Take steps to improve ventilation at home Improve ventilation (air flow) at home to help prevent from spreading COVID-19 to other people in your household. Clear out COVID-19 virus particles in the air by opening windows, using air filters, and turning on fans in your home. Use this interactive tool to learn how to improve air flow in your home. When you can be around others after being sick with COVID-19 Deciding when you can be around others is different for different situations. Find out when you can safely end home isolation. For any additional questions about your care, contact your healthcare provider or state or local health department. 12/19/2020 Content source: [facility] ([facility]), Division of Viral Diseases This information is not intended to replace advice given to you by your health care provider. Make sure you discuss any questions you have with your health care provider. Document Revised: 02/01/2021 Document Reviewed: 02/01/2021 Elsevier Patient Education  2022 [employer].      If you have been instructed to have an in-person evaluation today at a local Urgent Care facility, please use the link below. It will take you to a list of all of our available [facility], including address, phone number and hours of operation. Please do not delay care.  [facility]  If you or a family member do not have a primary care provider, use the link below to schedule a visit and establish care. When you choose a [facility] primary care physician or advanced practice provider, you gain a long-term partner in health. Find a Primary Care Provider  Learn more about [facility]'s in-office and virtual care options: [facility]

## 2022-07-29 ENCOUNTER — Encounter (INDEPENDENT_AMBULATORY_CARE_PROVIDER_SITE_OTHER): Payer: Self-pay

## 2022-07-31 IMAGING — US US EXTREM LOW VENOUS*R*
1 series · 14 of 24 positions shown · non-contrast
Comparison: None.

CLINICAL DATA: Pain and swelling

EXAM:
Right LOWER EXTREMITY VENOUS DOPPLER ULTRASOUND
TECHNIQUE: Gray-scale sonography with compression, as well as color and duplex
ultrasound, were performed to evaluate the deep venous system(s)
from the level of the common femoral vein through the popliteal and
proximal calf veins.

[Series 1: us venous img lower uni right (dvt) · portal-venous · 14 of 45 slices shown]
[im 1/45]
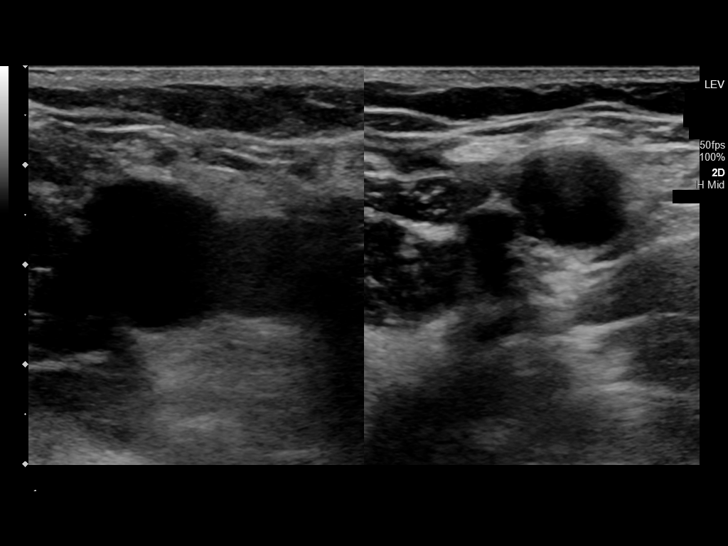
[im 4/45]
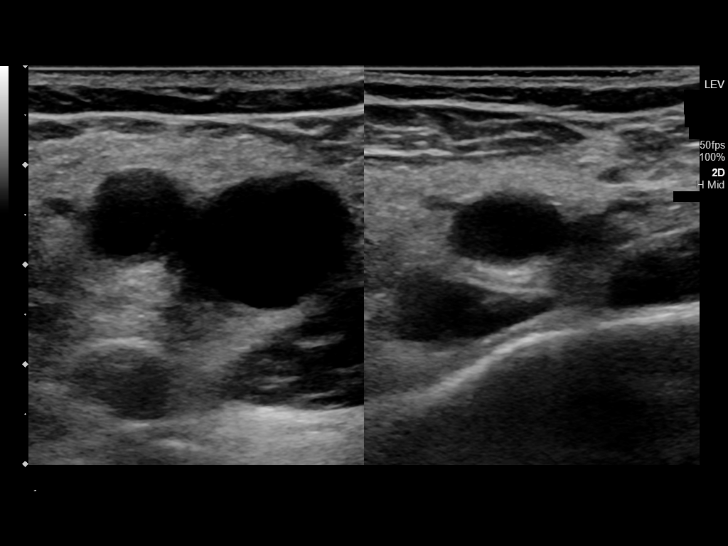
[im 8/45]
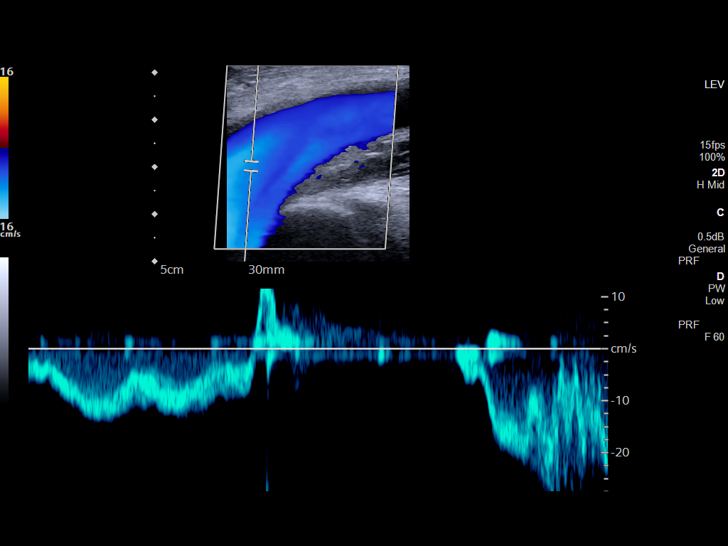
[im 12/45]
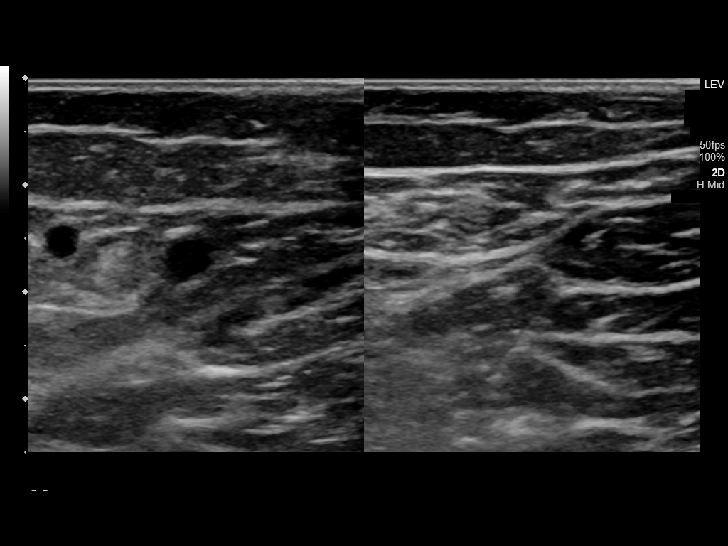
[im 14/45]
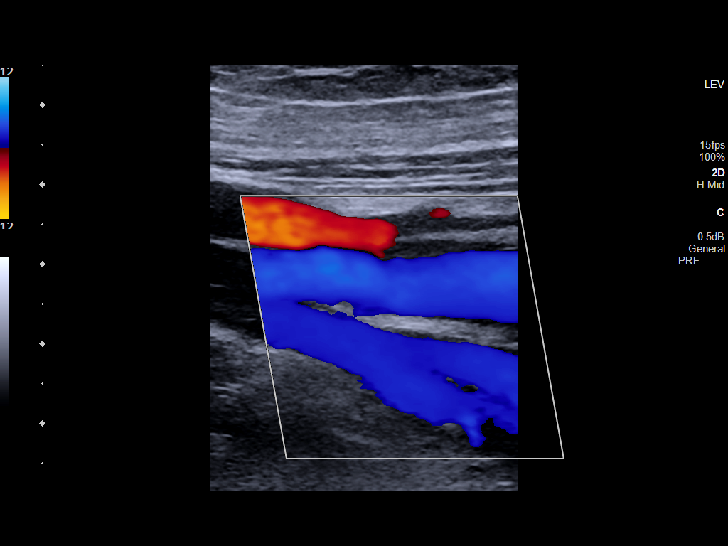
[im 18/45]
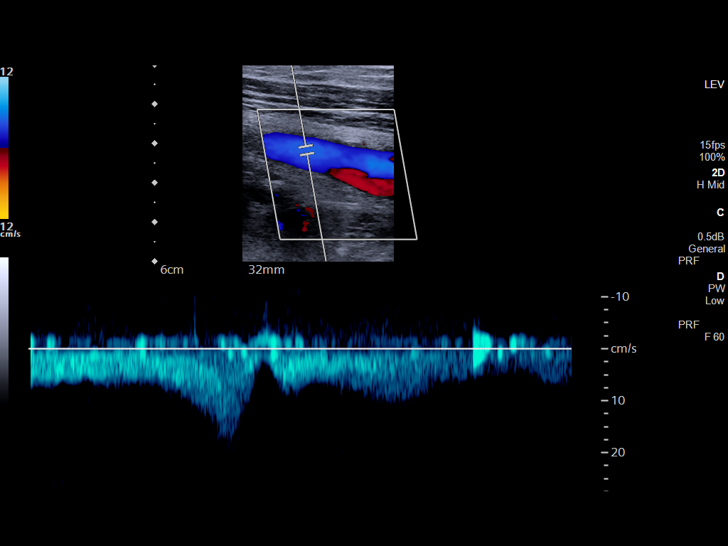
[im 22/45]
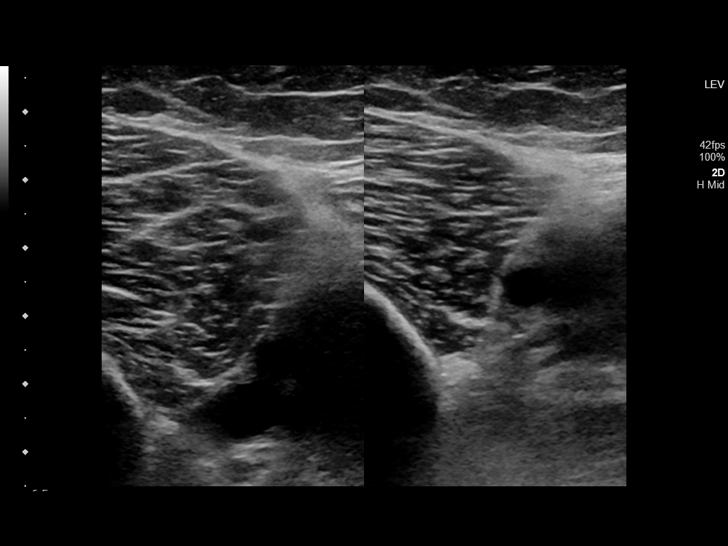
[im 23/45]
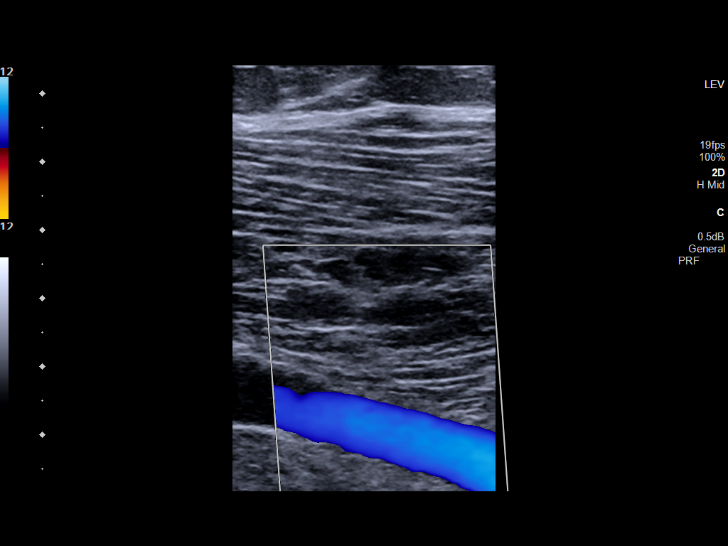
[im 27/45]
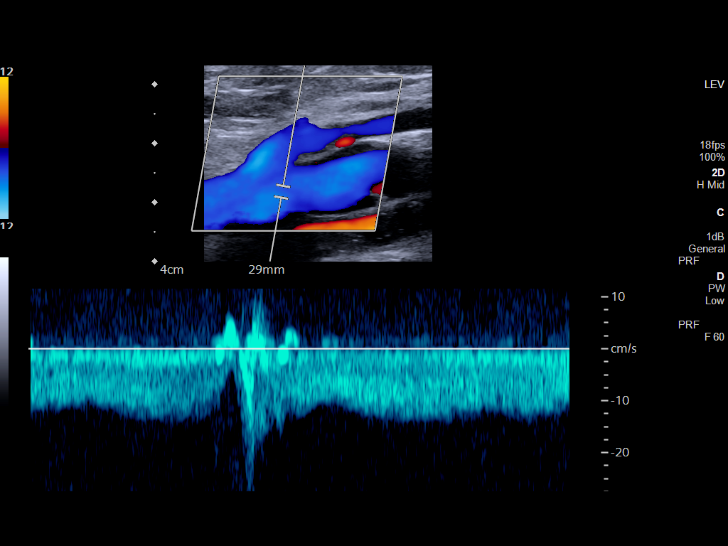
[im 31/45]
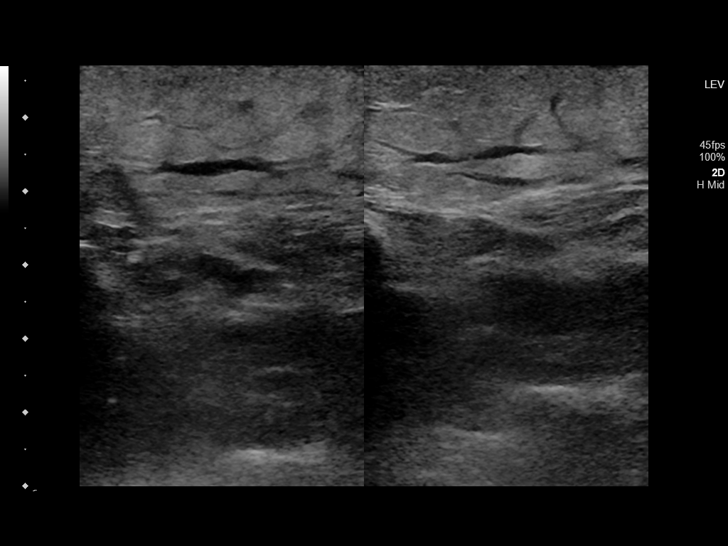
[im 35/45]
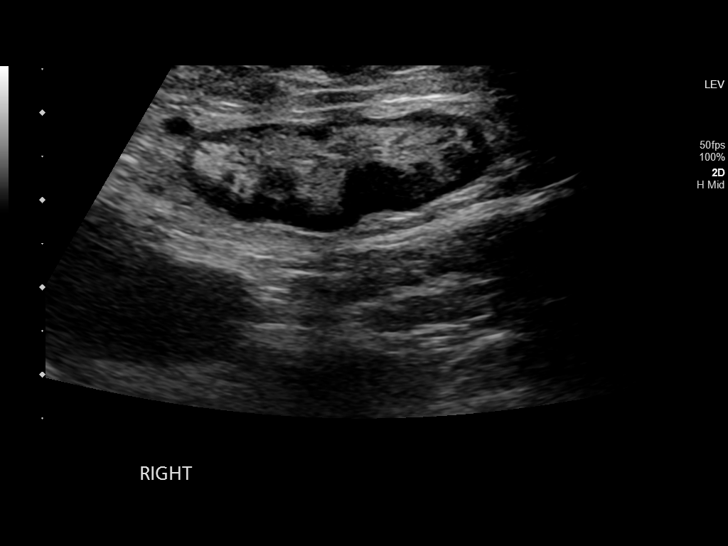
[im 37/45]
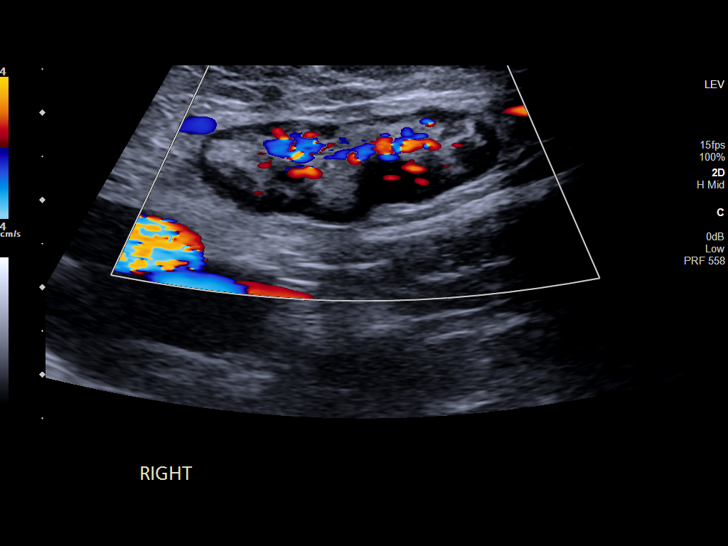
[im 41/45]
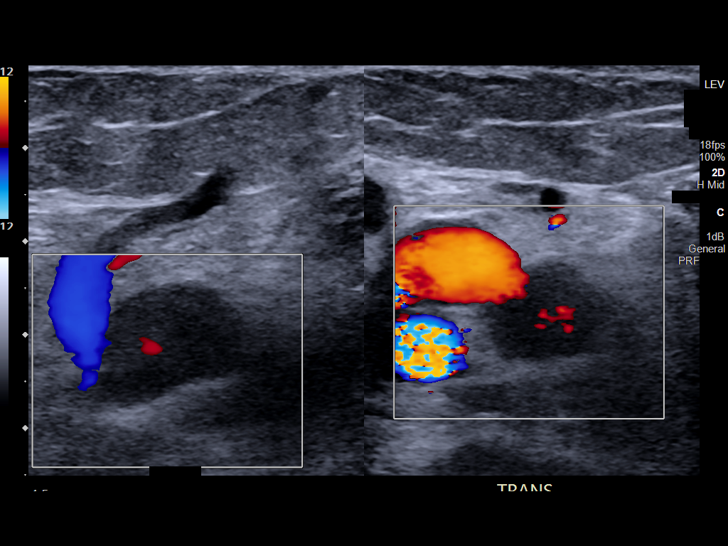
[im 45/45]
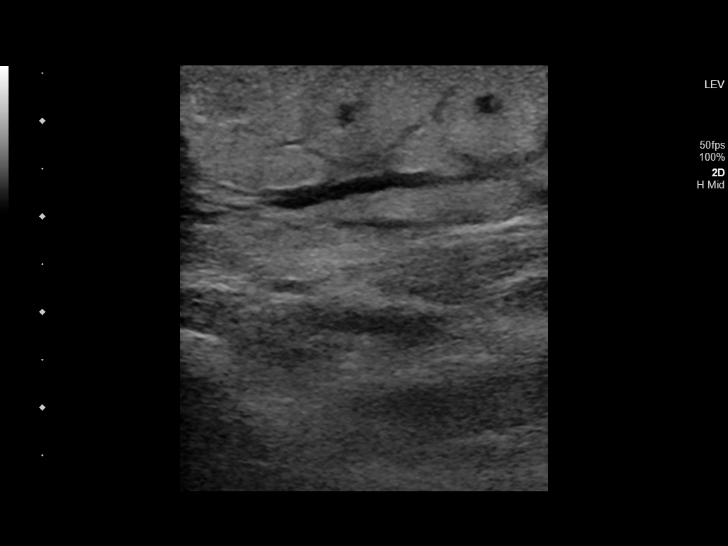

[14 of 24 positions shown; findings below may reference images not displayed]

FINDINGS: VENOUS

Normal compressibility of the common femoral, superficial femoral,
and popliteal veins, as well as the visualized calf veins.
Visualized portions of profunda femoral vein and great saphenous
vein unremarkable. No filling defects to suggest DVT on grayscale or
color Doppler imaging. Doppler waveforms show normal direction of
venous flow, normal respiratory plasticity and response to
augmentation.

Limited views of the contralateral common femoral vein are
unremarkable.

OTHER

There are few slightly enlarged lymph nodes in the inguinal region
and popliteal fossa, possibly suggesting reactive hyperplasia. There
is edema in the subcutaneous plane.

Limitations: none
IMPRESSION: There is no evidence of deep venous thrombosis in the right lower
extremity.

## 2022-08-05 ENCOUNTER — Encounter: Payer: Self-pay | Admitting: Internal Medicine

## 2022-08-05 ENCOUNTER — Ambulatory Visit (INDEPENDENT_AMBULATORY_CARE_PROVIDER_SITE_OTHER): Payer: PRIVATE HEALTH INSURANCE | Admitting: Internal Medicine

## 2022-08-05 VITALS — BP 146/88 | HR 85 | Temp 96.8°F | Wt 178.0 lb

## 2022-08-05 DIAGNOSIS — I890 Lymphedema, not elsewhere classified: Secondary | ICD-10-CM | POA: Diagnosis not present

## 2022-08-05 DIAGNOSIS — E7800 Pure hypercholesterolemia, unspecified: Secondary | ICD-10-CM

## 2022-08-05 DIAGNOSIS — D649 Anemia, unspecified: Secondary | ICD-10-CM | POA: Diagnosis not present

## 2022-08-05 DIAGNOSIS — M25532 Pain in left wrist: Secondary | ICD-10-CM

## 2022-08-05 DIAGNOSIS — I10 Essential (primary) hypertension: Secondary | ICD-10-CM | POA: Diagnosis not present

## 2022-08-05 LAB — COMPLETE METABOLIC PANEL WITH GFR
AG Ratio: 1.8 (calc) (ref 1.0–2.5)
ALT: 9 U/L (ref 6–29)
AST: 12 U/L (ref 10–35)
Albumin: 4.2 g/dL (ref 3.6–5.1)
Alkaline phosphatase (APISO): 68 U/L (ref 37–153)
BUN: 11 mg/dL (ref 7–25)
CO2: 30 mmol/L (ref 20–32)
Calcium: 9.4 mg/dL (ref 8.6–10.4)
Chloride: 107 mmol/L (ref 98–110)
Creat: 0.78 mg/dL (ref 0.50–1.05)
Globulin: 2.4 g/dL (calc) (ref 1.9–3.7)
Glucose, Bld: 98 mg/dL (ref 65–139)
Potassium: 3.7 mmol/L (ref 3.5–5.3)
Sodium: 142 mmol/L (ref 135–146)
Total Bilirubin: 0.7 mg/dL (ref 0.2–1.2)
Total Protein: 6.6 g/dL (ref 6.1–8.1)
eGFR: 86 mL/min/{1.73_m2} (ref >=60)

## 2022-08-05 LAB — LIPID PANEL
Cholesterol: 180 mg/dL (ref <200)
HDL: 63 mg/dL (ref >=50)
LDL Cholesterol (Calc): 100 mg/dL (calc) — ABNORMAL HIGH
Non-HDL Cholesterol (Calc): 117 mg/dL (calc) (ref <130)
Total CHOL/HDL Ratio: 2.9 (calc) (ref <5.0)
Triglycerides: 77 mg/dL (ref <150)

## 2022-08-05 LAB — CBC
HCT: 36.5 % (ref 35.0–45.0)
Hemoglobin: 12.4 g/dL (ref 11.7–15.5)
MCH: 29.5 pg (ref 27.0–33.0)
MCHC: 34 g/dL (ref 32.0–36.0)
MCV: 86.7 fL (ref 80.0–100.0)
MPV: 12.2 fL (ref 7.5–12.5)
Platelets: 291 10*3/uL (ref 140–400)
RBC: 4.21 10*6/uL (ref 3.80–5.10)
RDW: 14.7 % (ref 11.0–15.0)
WBC: 4.3 10*3/uL (ref 3.8–10.8)

## 2022-08-05 MED ORDER — OLMESARTAN-AMLODIPINE-HCTZ 20-5-12.5 MG PO TABS: 1.0000 | ORAL_TABLET | Freq: Every day | ORAL | 0 refills | Status: DC

## 2022-08-05 NOTE — Progress Notes (Signed)
Subjective:    Patient ID: Amber Figueroa, female    DOB: [birth year], 61 y.o.   MRN: [id number]  HPI  Patient presents to clinic today for follow-up of chronic conditions.  Lymphedema: She is not currently taking any medications for this but is wearing TED hose.  She has seen vascular in the past for the same.  HTN: Her BP today is 144/96.  She is taking Losartan as prescribed.  ECG from 03/2019 reviewed.  Anemia: Her last H/H was 10.3/32.2, 11/2021.  She is not taking any oral iron.  She does not follow with hematology.  HLD: Her last LDL was 126, triglycerides 73, 12/2021.  She is not taking any cholesterol-lowering medication at this time.  She does not consume a low-fat diet.  Review of Systems     Past Medical History:  Diagnosis Date   Back pain    Hypertension     Current Outpatient Medications  Medication Sig Dispense Refill   albuterol (VENTOLIN HFA) 108 (90 Base) MCG/ACT inhaler Inhale 2 puffs into the lungs every 6 (six) hours as needed for wheezing or shortness of breath. 8 g 0   benzonatate (TESSALON) 100 MG capsule Take 1 capsule (100 mg total) by mouth 3 (three) times daily as needed for cough. 30 capsule 0   losartan (COZAAR) 25 MG tablet Take 1 tablet (25 mg total) by mouth daily. 90 tablet 1   No current facility-administered medications for this visit.    No Known Allergies  Family History  Problem Relation Age of Onset   Cancer Mother    Cancer Father        unknown type   Hypertension Sister    Hypertension Brother    Hyperlipidemia Brother    Breast cancer Maternal Grandmother        likely breast ca - mastectomy   Hypertension Brother    Diabetes Brother    Hypertension Brother    Heart attack Neg Hx    Stroke Neg Hx    Colon cancer Neg Hx    Ovarian cancer Neg Hx     Social History   Socioeconomic History   Marital status: Married    Spouse name: Not on file   Number of children: Not on file   Years of education: Not on file    Highest education level: Not on file  Occupational History   Not on file  Tobacco Use   Smoking status: Every Day    Packs/day: 0.50    Years: 1.00    Total pack years: 0.50    Types: Cigarettes   Smokeless tobacco: Never   Tobacco comments:    age [age] smoked 1/2 ppd, quit x 20 years, now smoking 1 year  Vaping Use   Vaping Use: Never used  Substance and Sexual Activity   Alcohol use: No   Drug use: No   Sexual activity: Never  Other Topics Concern   Not on file  Social History Narrative   Not on file   Social Determinants of Health   Financial Resource Strain: Not on file  Food Insecurity: Not on file  Transportation Needs: Not on file  Physical Activity: Not on file  Stress: Not on file  Social Connections: Not on file  Intimate Partner Violence: Not on file     Constitutional: Denies fever, malaise, fatigue, headache or abrupt weight changes.  HEENT: Denies eye pain, eye redness, ear pain, ringing in the ears, wax buildup, runny nose, nasal congestion,  bloody nose, or sore throat. Respiratory: Denies difficulty breathing, shortness of breath, cough or sputum production.   Cardiovascular: Patient reports swelling in legs.  Denies chest pain, chest tightness, palpitations or swelling in the hands.  Gastrointestinal: Denies abdominal pain, bloating, constipation, diarrhea or blood in the stool.  GU: Denies urgency, frequency, pain with urination, burning sensation, blood in urine, odor or discharge. Musculoskeletal: Patient reports left wrist pain.  Denies decrease in range of motion, difficulty with gait, muscle pain or joint swelling.  Skin: Denies redness, rashes, lesions or ulcercations.  Neurological: Denies dizziness, difficulty with memory, difficulty with speech or problems with balance and coordination.  Psych: Denies anxiety, depression, SI/HI.  No other specific complaints in a complete review of systems (except as listed in HPI above).  Objective:    Physical Exam  BP (!) 146/88 (BP Location: Right Arm, Patient Position: Sitting, Cuff Size: Normal)   Pulse 85   Temp (!) 96.8 F (36 C) (Temporal)   Wt 178 lb (80.7 kg)   SpO2 99%   BMI 24.83 kg/m   Wt Readings from Last 3 Encounters:  05/16/22 181 lb (82.1 kg)  03/29/22 189 lb (85.7 kg)  02/10/22 185 lb (83.9 kg)    General: Appears her stated age, well developed, well nourished in NAD. Skin: Warm, dry and intact.  HEENT: Head: normal shape and size; Eyes: sclera white, no icterus, conjunctiva pink, PERRLA and EOMs intact;  Cardiovascular: Normal rate and rhythm. S1,S2 noted.  No murmur, rubs or gallops noted. Trace BLE edema. No carotid bruits noted. Pulmonary/Chest: Normal effort and positive vesicular breath sounds. No respiratory distress. No wheezes, rales or ronchi noted.  Musculoskeletal: Normal flexion of the left wrist.  Normal extension, rotation of the left wrist.  Pain with palpation over the metacarpals.  Handgrips equal.  No signs of joint swelling. No difficulty with gait.  Neurological: Alert and oriented.  Coordination normal.    BMET    Component Value Date/Time   NA 141 12/05/2021 0305   K 3.6 12/05/2021 0305   CL 108 12/05/2021 0305   CO2 27 12/05/2021 0305   GLUCOSE 105 (H) 12/05/2021 0305   BUN 15 12/05/2021 0305   CREATININE 0.85 12/05/2021 0305   CREATININE 0.75 06/09/2017 0832   CALCIUM 8.9 12/05/2021 0305   GFRNONAA >60 12/05/2021 0305   GFRNONAA 89 06/09/2017 0832   GFRAA >60 04/08/2019 0226   GFRAA 103 06/09/2017 0832    Lipid Panel     Component Value Date/Time   CHOL 198 01/16/2022 0924   TRIG 73 01/16/2022 0924   HDL 55 01/16/2022 0924   CHOLHDL 3.6 01/16/2022 0924   LDLCALC 126 (H) 01/16/2022 0924    CBC    Component Value Date/Time   WBC 8.8 12/05/2021 0305   RBC 3.72 (L) 12/05/2021 0305   HGB 10.3 (L) 12/05/2021 0305   HCT 32.2 (L) 12/05/2021 0305   PLT 192 12/05/2021 0305   MCV 86.6 12/05/2021 0305   MCH 27.7  12/05/2021 0305   MCHC 32.0 12/05/2021 0305   RDW 15.3 12/05/2021 0305   LYMPHSABS 1,410 06/09/2017 0832   EOSABS 291 06/09/2017 0832   BASOSABS 41 06/09/2017 0832    Hgb A1C Lab Results  Component Value Date   HGBA1C 5.3 01/16/2022            Assessment & Plan:   Left Wrist Pain:  Likely arthritic in nature She declines x-ray of left wrist today Can try Tylenol OTC as needed  Encouraged use of wrist splint, mainly while working  RTC in 2 weeks, follow-up HTN, 6 months for your annual exam Nicki Reaper, NP

## 2022-08-05 NOTE — Assessment & Plan Note (Signed)
C-Met and lipid profile today Encouraged her to consume a low-fat diet 

## 2022-08-05 NOTE — Patient Instructions (Signed)
Elastic Bandage and RICE Therapy  Elastic bandages come in different shapes and sizes. They generally provide support to your injury and reduce swelling while you are healing, but they can perform different functions. Your health care provider will help you to decide what is best for your protection, recovery, or rehabilitation after an injury. The routine care of many injuries includes rest, ice, compression, and elevation (RICE therapy). RICE therapy is often recommended for injuries to soft tissues, such as muscle strain, sprains, bruises, and overuse injuries. It can also be used for some bone injuries. Using RICE therapy can help to relieve pain and lessen swelling. What are some general tips for using an elastic bandage? Use the bandage as directed by the maker of the bandage that you are using. Do not wrap the bandage too tightly. This may block (cut off) the circulation in the arm or leg in the area below the bandage. If part of your body beyond the bandage becomes blue, numb, cold, swollen, or more painful, your bandage is probably too tight. If this occurs, remove your bandage and reapply it more loosely. Remove and reapply an elastic bandage every 3-4 hours or as told by your health care provider. See your health care provider if the bandage seems to be making your problems worse rather than better. How to care for your injury with RICE therapy Rest Rest your injury. This may help with the healing process. Rest usually involves limiting your normal activities and not using the injured part of your body. Generally, you can return to your normal activities when your health care provider says it is okay and you can do them without much discomfort. If you rest the injury too much, it may not heal as well. Some injuries heal better with early movement instead of resting for too long. Talk with your health care provider about how you should limit your activities and whether you should start  range-of-motion exercises for your injury. Ice Ice your injury to lessen swelling and pain. Do not apply ice directly to your skin. Put ice in a plastic bag. Place a towel between your skin and the bag. Leave the ice on for 20 minutes, 2-3 times a day. Use ice on as many days as told by your health care provider.  Compression Put pressure (compression) on your injured area to control swelling, give support, and help with discomfort. Compression may be done with an elastic bandage. Elevation Raise (elevate) your injured area to lessen swelling and pain. If possible, elevate your injured area at or above the level of your heart or the center of your chest. Contact a health care provider if: Your pain and swelling continue. Your symptoms are getting worse rather than improving. Having these problems may mean that you need further evaluation or imaging tests, such as X-rays or an MRI. Sometimes, X-rays may not show a small broken bone (fracture) until days after the injury happened. Make a follow-up appointment with your health care provider. Ask your health care provider, or the department that is doing the imaging test, when your results will be ready. Get help right away if: You have sudden severe pain at or below the area of your injury. You have redness or increased swelling around your injury. You have tingling or numbness at or below the area of your injury and it does not improve after you remove the elastic bandage. Summary Elastic bandages provide support to your injury and reduce swelling while you are healing. Your   health care provider will help you decide which type of elastic bandage is best for your injury. Do not wrap the bandage too tightly. This may block (cut off) the circulation in the arm or leg in the area below the bandage. Putting pressure (compression) on your injured area with an elastic bandage is part of RICE therapy. RICE therapy includes rest, ice, compression, and  elevation. This treatment is recommended for the routine care of many injuries. This information is not intended to replace advice given to you by your health care provider. Make sure you discuss any questions you have with your health care provider. Document Revised: 11/12/2019 Document Reviewed: 06/06/2017 Elsevier Patient Education  2021 Elsevier Inc.  

## 2022-08-05 NOTE — Assessment & Plan Note (Signed)
CBC today.  

## 2022-08-05 NOTE — Assessment & Plan Note (Signed)
Continue use of TEDs Encourage elevation

## 2022-08-05 NOTE — Assessment & Plan Note (Signed)
Uncontrolled, will D/C losartan Rx for amlodipine-olmesartan-HCTZ Reinforced DASH diet and exercise for weight loss C-Met today

## 2022-08-11 ENCOUNTER — Other Ambulatory Visit: Payer: Self-pay | Admitting: Internal Medicine

## 2022-08-11 DIAGNOSIS — I10 Essential (primary) hypertension: Secondary | ICD-10-CM

## 2022-08-12 NOTE — Telephone Encounter (Signed)
Unable to refill per protocol, Rx expired. Medication was discontinued 08/05/22. Will refuse. Requested Prescriptions  Pending Prescriptions Disp Refills   losartan (COZAAR) 25 MG tablet [Pharmacy Med Name: LOSARTAN 25MG  TABLETS] 90 tablet 1    Sig: TAKE 1 TABLET(25 MG) BY MOUTH DAILY     Cardiovascular:  Angiotensin Receptor Blockers Failed - 08/11/2022  3:39 AM      Failed - Last BP in normal range    BP Readings from Last 1 Encounters:  08/05/22 (!) 146/88         Passed - Cr in normal range and within 180 days    Creat  Date Value Ref Range Status  08/05/2022 0.78 0.50 - 1.05 mg/dL Final         Passed - K in normal range and within 180 days    Potassium  Date Value Ref Range Status  08/05/2022 3.7 3.5 - 5.3 mmol/L Final         Passed - Patient is not pregnant      Passed - Valid encounter within last 6 months    Recent Outpatient Visits           1 week ago Pure hypercholesterolemia   [facility] [place], Mull[place], NP   4 months ago Cellulitis of right lower extremity   [facility] [place], Mullins, NP   6 months ago Pure hypercholesterolemia   [facility] [employer], Mullins, NP   6 months ago Encounter for general adult medical examination with abnormal findings   [facility] [place], Mullins, NP   8 months ago Cellulitis of right lower leg   [facility] [place], Mullins, [place]

## 2022-08-26 ENCOUNTER — Encounter: Payer: Self-pay | Admitting: Internal Medicine

## 2022-08-26 ENCOUNTER — Ambulatory Visit (INDEPENDENT_AMBULATORY_CARE_PROVIDER_SITE_OTHER): Payer: No Typology Code available for payment source | Admitting: Internal Medicine

## 2022-08-26 VITALS — BP 161/71 | HR 72 | Temp 97.9°F | Wt 179.0 lb

## 2022-08-26 DIAGNOSIS — I10 Essential (primary) hypertension: Secondary | ICD-10-CM | POA: Diagnosis not present

## 2022-08-26 MED ORDER — AMLODIPINE BESYLATE 10 MG PO TABS: 10.0000 mg | ORAL_TABLET | Freq: Every day | ORAL | 0 refills | Status: DC

## 2022-08-26 NOTE — Assessment & Plan Note (Signed)
Uncontrolled off meds Reinforced DASH diet and exercise for weight loss Will d/c Amlodipine-Olmesartan-HCTZ RX for Amlodipine 10 mg daily

## 2022-08-26 NOTE — Progress Notes (Signed)
Subjective:    Patient ID: Amber Figueroa, female    DOB: 1961/01/20, 61 y.o.   MRN: [id number]  HPI  Patient presents to clinic today for 2-week follow-up of HTN.  At her last visit, her blood pressure medication was changed to Amlodipine-Olmesartan-HCTZ.  Pt tried the medicine for 5 days and had to stop.  She reports it was causing her dizziness, and also "boils" on her skin.  Once she stopped her symptoms improved.  She went back on her previous medicine but ran out about 4days ago.  Her BP today is 161/71.  ECG from 03/2019 reviewed.  Review of Systems     Past Medical History:  Diagnosis Date   Back pain    Hypertension     Current Outpatient Medications  Medication Sig Dispense Refill   albuterol (VENTOLIN HFA) 108 (90 Base) MCG/ACT inhaler Inhale 2 puffs into the lungs every 6 (six) hours as needed for wheezing or shortness of breath. 8 g 0   Olmesartan-amLODIPine-HCTZ 20-5-12.5 MG TABS Take 1 tablet by mouth daily. 30 tablet 0   No current facility-administered medications for this visit.    No Known Allergies  Family History  Problem Relation Age of Onset   Cancer Mother    Cancer Father        unknown type   Hypertension Sister    Hypertension Brother    Hyperlipidemia Brother    Breast cancer Maternal Grandmother        likely breast ca - mastectomy   Hypertension Brother    Diabetes Brother    Hypertension Brother    Heart attack Neg Hx    Stroke Neg Hx    Colon cancer Neg Hx    Ovarian cancer Neg Hx     Social History   Socioeconomic History   Marital status: Married    Spouse name: Not on file   Number of children: Not on file   Years of education: Not on file   Highest education level: Not on file  Occupational History   Not on file  Tobacco Use   Smoking status: Every Day    Packs/day: 0.50    Years: 1.00    Total pack years: 0.50    Types: Cigarettes   Smokeless tobacco: Never   Tobacco comments:    age [age] smoked 1/2 ppd, quit x 20  years, now smoking 1 year  Vaping Use   Vaping Use: Never used  Substance and Sexual Activity   Alcohol use: No   Drug use: No   Sexual activity: Never  Other Topics Concern   Not on file  Social History Narrative   Not on file   Social Determinants of Health   Financial Resource Strain: Not on file  Food Insecurity: Not on file  Transportation Needs: Not on file  Physical Activity: Not on file  Stress: Not on file  Social Connections: Not on file  Intimate Partner Violence: Not on file     Constitutional: Patient reports headache.  Denies fever, malaise, fatigue, or abrupt weight changes.  Respiratory: Denies difficulty breathing, shortness of breath, cough or sputum production.   Cardiovascular: Denies chest pain, chest tightness, palpitations or swelling in the hands or feet.  Neurological: Patient reports lightheadedness.  Denies difficulty with memory, difficulty with speech or problems with balance and coordination.    No other specific complaints in a complete review of systems (except as listed in HPI above).  Objective:   Physical Exam  BP (!) 161/71 (BP Location: Left Arm, Patient Position: Sitting, Cuff Size: Normal)   Pulse 72   Temp 97.9 F (36.6 C) (Oral)   Wt 179 lb (81.2 kg)   SpO2 100%   BMI 24.97 kg/m   Wt Readings from Last 3 Encounters:  08/05/22 178 lb (80.7 kg)  05/16/22 181 lb (82.1 kg)  03/29/22 189 lb (85.7 kg)    General: Appears her stated age, well developed, well nourished in NAD. HEENT: Head: normal shape and size; Eyes: sclera white, no icterus, conjunctiva pink, PERRLA and EOMs intact;  Cardiovascular: Normal rate and rhythm. S1,S2 noted.  No murmur, rubs or gallops noted. No JVD or BLE edema. No carotid bruits noted. Pulmonary/Chest: Normal effort and positive vesicular breath sounds. No respiratory distress. No wheezes, rales or ronchi noted.  Musculoskeletal: No difficulty with gait.  Neurological: Alert and oriented.   Coordination normal.    BMET    Component Value Date/Time   NA 142 08/05/2022 1004   K 3.7 08/05/2022 1004   CL 107 08/05/2022 1004   CO2 30 08/05/2022 1004   GLUCOSE 98 08/05/2022 1004   BUN 11 08/05/2022 1004   CREATININE 0.78 08/05/2022 1004   CALCIUM 9.4 08/05/2022 1004   GFRNONAA >60 12/05/2021 0305   GFRNONAA 89 06/09/2017 0832   GFRAA >60 04/08/2019 0226   GFRAA 103 06/09/2017 0832    Lipid Panel     Component Value Date/Time   CHOL 180 08/05/2022 1004   TRIG 77 08/05/2022 1004   HDL 63 08/05/2022 1004   CHOLHDL 2.9 08/05/2022 1004   LDLCALC 100 (H) 08/05/2022 1004    CBC    Component Value Date/Time   WBC 4.3 08/05/2022 1004   RBC 4.21 08/05/2022 1004   HGB 12.4 08/05/2022 1004   HCT 36.5 08/05/2022 1004   PLT 291 08/05/2022 1004   MCV 86.7 08/05/2022 1004   MCH 29.5 08/05/2022 1004   MCHC 34.0 08/05/2022 1004   RDW 14.7 08/05/2022 1004   LYMPHSABS 1,410 06/09/2017 0832   EOSABS 291 06/09/2017 0832   BASOSABS 41 06/09/2017 0832    Hgb A1C Lab Results  Component Value Date   HGBA1C 5.3 01/16/2022          Assessment & Plan:      RTC in 6 months for your annual exam Nicki Reaper, NP

## 2022-08-26 NOTE — Patient Instructions (Signed)
Blood Pressure Record Sheet To take your blood pressure, you will need a blood pressure machine. You may be prescribed one, or you can buy a blood pressure machine (blood pressure monitor) at your clinic, drug store, or online. When choosing one, look for these features: An automatic monitor that has an arm cuff. A cuff that wraps snugly, but not too tightly, around your upper arm. You should be able to fit only one finger between your arm and the cuff. A device that stores blood pressure reading results. Do not choose a monitor that measures your blood pressure from your wrist or finger. Follow your health care provider's instructions for how to take your blood pressure. To use this form: Get one reading in the morning (a.m.) before you take any medicines. Get one reading in the evening (p.m.) before supper. Take at least two readings with each blood pressure check. This makes sure the results are correct. Wait 1-2 minutes between measurements. Write down the results in the spaces on this form. Repeat this once a week, or as told by your health care provider. Make a follow-up appointment with your health care provider to discuss the results. Blood pressure log Date: _______________________ a.m. _____________________(1st reading) _____________________(2nd reading) p.m. _____________________(1st reading) _____________________(2nd reading) Date: _______________________ a.m. _____________________(1st reading) _____________________(2nd reading) p.m. _____________________(1st reading) _____________________(2nd reading) Date: _______________________ a.m. _____________________(1st reading) _____________________(2nd reading) p.m. _____________________(1st reading) _____________________(2nd reading) Date: _______________________ a.m. _____________________(1st reading) _____________________(2nd reading) p.m. _____________________(1st reading) _____________________(2nd reading) Date:  _______________________ a.m. _____________________(1st reading) _____________________(2nd reading) p.m. _____________________(1st reading) _____________________(2nd reading) This information is not intended to replace advice given to you by your health care provider. Make sure you discuss any questions you have with your health care provider. Document Revised: 05/31/2021 Document Reviewed: 05/31/2021 Elsevier Patient Education  2023 Elsevier Inc.  

## 2022-09-04 ENCOUNTER — Other Ambulatory Visit: Payer: Self-pay | Admitting: Internal Medicine

## 2022-09-04 NOTE — Telephone Encounter (Signed)
Unable to refill per protocol, Rx expired. Medication was discontinued 08/26/22. Will refuse.  Requested Prescriptions  Pending Prescriptions Disp Refills   Olmesartan-amLODIPine-HCTZ 20-5-12.5 MG TABS [Pharmacy Med Name: OLMESARTAN/AMLOD/HCTZ 20-5-12.5MG T] 30 tablet 0    Sig: TAKE 1 TABLET BY MOUTH DAILY     Cardiovascular: CCB + ARB + Diuretic Combos Failed - 09/04/2022  3:38 AM      Failed - Last BP in normal range    BP Readings from Last 1 Encounters:  08/26/22 (!) 161/71         Passed - K in normal range and within 180 days    Potassium  Date Value Ref Range Status  08/05/2022 3.7 3.5 - 5.3 mmol/L Final         Passed - Na in normal range and within 180 days    Sodium  Date Value Ref Range Status  08/05/2022 142 135 - 146 mmol/L Final         Passed - Cr in normal range and within 180 days    Creat  Date Value Ref Range Status  08/05/2022 0.78 0.50 - 1.05 mg/dL Final         Passed - eGFR is 10 or above and within 180 days    GFR, Est African American  Date Value Ref Range Status  06/09/2017 103 > OR = 60 mL/min/1.54m Final   GFR calc Af Amer  Date Value Ref Range Status  04/08/2019 >60 >60 mL/min Final   GFR, Est Non African American  Date Value Ref Range Status  06/09/2017 89 > OR = 60 mL/min/1.74mFinal   GFR, Estimated  Date Value Ref Range Status  12/05/2021 >60 >60 mL/min Final    Comment:    (NOTE) Calculated using the CKD-EPI Creatinine Equation (2021)    eGFR  Date Value Ref Range Status  08/05/2022 86 > OR = 60 mL/min/1.7316minal         Passed - Patient is not pregnant      Passed - Last Heart Rate in normal range    Pulse Readings from Last 1 Encounters:  08/26/22 72         Passed - Valid encounter within last 6 months    Recent Outpatient Visits           1 week ago [facility] hypertension   [facility]   1 month ago [place]   [facility]   5 months ago Cellulitis of right lower extremity   [facility]   7 months ago Pure hypercholesterolemia   [facility]   7 months ago Encounter for general adult medical examination with abnormal findings   [facility]       Future Appointments             In 5 days Baity, [facility]

## 2022-09-09 ENCOUNTER — Encounter: Payer: Self-pay | Admitting: Internal Medicine

## 2022-09-09 ENCOUNTER — Ambulatory Visit (INDEPENDENT_AMBULATORY_CARE_PROVIDER_SITE_OTHER): Payer: No Typology Code available for payment source | Admitting: Internal Medicine

## 2022-09-09 VITALS — BP 136/86 | HR 77 | Temp 96.8°F | Wt 172.0 lb

## 2022-09-09 DIAGNOSIS — I10 Essential (primary) hypertension: Secondary | ICD-10-CM

## 2022-09-09 MED ORDER — AMLODIPINE BESYLATE 10 MG PO TABS: 10.0000 mg | ORAL_TABLET | Freq: Every day | ORAL | 1 refills | Status: DC

## 2022-09-09 NOTE — Assessment & Plan Note (Signed)
Controlled on Amlodipine, refilled today Reinforced DASH diet We will monitor

## 2022-09-09 NOTE — Progress Notes (Signed)
Subjective:    Patient ID: Amber Figueroa, female    DOB: 1961/03/02, 61 y.o.   MRN: [id number]  HPI  Patient presents to clinic today for 2-week follow-up of HTN.  At her last visit, she was started on Amlodipine 10 mg daily.  She is taking medication as prescribed.  Her BP today is 136/86.  ECG from 03/2019 reviewed.  Review of Systems     Past Medical History:  Diagnosis Date   Back pain    Hypertension     Current Outpatient Medications  Medication Sig Dispense Refill   albuterol (VENTOLIN HFA) 108 (90 Base) MCG/ACT inhaler Inhale 2 puffs into the lungs every 6 (six) hours as needed for wheezing or shortness of breath. 8 g 0   amLODipine (NORVASC) 10 MG tablet Take 1 tablet (10 mg total) by mouth daily. 30 tablet 0   No current facility-administered medications for this visit.    No Known Allergies  Family History  Problem Relation Age of Onset   Cancer Mother    Cancer Father        unknown type   Hypertension Sister    Hypertension Brother    Hyperlipidemia Brother    Breast cancer Maternal Grandmother        likely breast ca - mastectomy   Hypertension Brother    Diabetes Brother    Hypertension Brother    Heart attack Neg Hx    Stroke Neg Hx    Colon cancer Neg Hx    Ovarian cancer Neg Hx     Social History   Socioeconomic History   Marital status: Married    Spouse name: Not on file   Number of children: Not on file   Years of education: Not on file   Highest education level: Not on file  Occupational History   Not on file  Tobacco Use   Smoking status: Every Day    Packs/day: 0.50    Years: 1.00    Total pack years: 0.50    Types: Cigarettes   Smokeless tobacco: Never   Tobacco comments:    age [age] smoked 1/2 ppd, quit x 20 years, now smoking 1 year  Vaping Use   Vaping Use: Never used  Substance and Sexual Activity   Alcohol use: No   Drug use: No   Sexual activity: Never  Other Topics Concern   Not on file  Social History  Narrative   Not on file   Social Determinants of Health   Financial Resource Strain: Not on file  Food Insecurity: Not on file  Transportation Needs: Not on file  Physical Activity: Not on file  Stress: Not on file  Social Connections: Not on file  Intimate Partner Violence: Not on file     Constitutional: Denies fever, malaise, fatigue, headache or abrupt weight changes.  Respiratory: Denies difficulty breathing, shortness of breath, cough or sputum production.   Cardiovascular: Denies chest pain, chest tightness, palpitations or swelling in the hands or feet.  Neurological: Denies dizziness, difficulty with memory, difficulty with speech or problems with balance and coordination.    No other specific complaints in a complete review of systems (except as listed in HPI above).  Objective:   Physical Exam BP 136/86 (BP Location: Left Arm, Patient Position: Sitting, Cuff Size: Normal)   Pulse 77   Temp (!) 96.8 F (36 C) (Temporal)   Wt 172 lb (78 kg)   SpO2 100%   BMI 23.99 kg/m  Wt Readings from Last 3 Encounters:  08/26/22 179 lb (81.2 kg)  08/05/22 178 lb (80.7 kg)  05/16/22 181 lb (82.1 kg)    General: Appears her stated age, well developed, well nourished in NAD. Cardiovascular: Normal rate and rhythm. S1,S2 noted.  No murmur, rubs or gallops noted. No JVD or BLE edema.  Pulmonary/Chest: Normal effort and positive vesicular breath sounds. No respiratory distress. No wheezes, rales or ronchi noted.  Musculoskeletal: No difficulty with gait.  Neurological: Alert and oriented.Coordination normal.     BMET    Component Value Date/Time   NA 142 08/05/2022 1004   K 3.7 08/05/2022 1004   CL 107 08/05/2022 1004   CO2 30 08/05/2022 1004   GLUCOSE 98 08/05/2022 1004   BUN 11 08/05/2022 1004   CREATININE 0.78 08/05/2022 1004   CALCIUM 9.4 08/05/2022 1004   GFRNONAA >60 12/05/2021 0305   GFRNONAA 89 06/09/2017 0832   GFRAA >60 04/08/2019 0226   GFRAA 103  06/09/2017 0832    Lipid Panel     Component Value Date/Time   CHOL 180 08/05/2022 1004   TRIG 77 08/05/2022 1004   HDL 63 08/05/2022 1004   CHOLHDL 2.9 08/05/2022 1004   LDLCALC 100 (H) 08/05/2022 1004    CBC    Component Value Date/Time   WBC 4.3 08/05/2022 1004   RBC 4.21 08/05/2022 1004   HGB 12.4 08/05/2022 1004   HCT 36.5 08/05/2022 1004   PLT 291 08/05/2022 1004   MCV 86.7 08/05/2022 1004   MCH 29.5 08/05/2022 1004   MCHC 34.0 08/05/2022 1004   RDW 14.7 08/05/2022 1004   LYMPHSABS 1,410 06/09/2017 0832   EOSABS 291 06/09/2017 0832   BASOSABS 41 06/09/2017 0832    Hgb A1C Lab Results  Component Value Date   HGBA1C 5.3 01/16/2022           Assessment & Plan:     RTC in 5 months for your annual exam Nicki Reaper, NP

## 2022-09-09 NOTE — Patient Instructions (Signed)
Hypertension, Adult High blood pressure (hypertension) is when the force of blood pumping through the arteries is too strong. The arteries are the blood vessels that carry blood from the heart throughout the body. Hypertension forces the heart to work harder to pump blood and may cause arteries to become narrow or stiff. Untreated or uncontrolled hypertension can lead to a heart attack, heart failure, a stroke, kidney disease, and other problems. A blood pressure reading consists of a higher number over a lower number. Ideally, your blood pressure should be below 120/80. The first ("top") number is called the systolic pressure. It is a measure of the pressure in your arteries as your heart beats. The second ("bottom") number is called the diastolic pressure. It is a measure of the pressure in your arteries as the heart relaxes. What are the causes? The exact cause of this condition is not known. There are some conditions that result in high blood pressure. What increases the risk? Certain factors may make you more likely to develop high blood pressure. Some of these risk factors are under your control, including: Smoking. Not getting enough exercise or physical activity. Being overweight. Having too much fat, sugar, calories, or salt (sodium) in your diet. Drinking too much alcohol. Other risk factors include: Having a personal history of heart disease, diabetes, high cholesterol, or kidney disease. Stress. Having a family history of high blood pressure and high cholesterol. Having obstructive sleep apnea. Age. The risk increases with age. What are the signs or symptoms? High blood pressure may not cause symptoms. Very high blood pressure (hypertensive crisis) may cause: Headache. Fast or irregular heartbeats (palpitations). Shortness of breath. Nosebleed. Nausea and vomiting. Vision changes. Severe chest pain, dizziness, and seizures. How is this diagnosed? This condition is diagnosed by  measuring your blood pressure while you are seated, with your arm resting on a flat surface, your legs uncrossed, and your feet flat on the floor. The cuff of the blood pressure monitor will be placed directly against the skin of your upper arm at the level of your heart. Blood pressure should be measured at least twice using the same arm. Certain conditions can cause a difference in blood pressure between your right and left arms. If you have a high blood pressure reading during one visit or you have normal blood pressure with other risk factors, you may be asked to: Return on a different day to have your blood pressure checked again. Monitor your blood pressure at home for 1 week or longer. If you are diagnosed with hypertension, you may have other blood or imaging tests to help your health care provider understand your overall risk for other conditions. How is this treated? This condition is treated by making healthy lifestyle changes, such as eating healthy foods, exercising more, and reducing your alcohol intake. You may be referred for counseling on a healthy diet and physical activity. Your health care provider may prescribe medicine if lifestyle changes are not enough to get your blood pressure under control and if: Your systolic blood pressure is above 130. Your diastolic blood pressure is above 80. Your personal target blood pressure may vary depending on your medical conditions, your age, and other factors. Follow these instructions at home: Eating and drinking  Eat a diet that is high in fiber and potassium, and low in sodium, added sugar, and fat. An example of this eating plan is called the DASH diet. DASH stands for Dietary Approaches to Stop Hypertension. To eat this way: Eat   plenty of fresh fruits and vegetables. Try to fill one half of your plate at each meal with fruits and vegetables. Eat whole grains, such as whole-wheat pasta, brown rice, or whole-grain bread. Fill about one  fourth of your plate with whole grains. Eat or drink low-fat dairy products, such as skim milk or low-fat yogurt. Avoid fatty cuts of meat, processed or cured meats, and poultry with skin. Fill about one fourth of your plate with lean proteins, such as fish, chicken without skin, beans, eggs, or tofu. Avoid pre-made and processed foods. These tend to be higher in sodium, added sugar, and fat. Reduce your daily sodium intake. Many people with hypertension should eat less than 1,500 mg of sodium a day. Do not drink alcohol if: Your health care provider tells you not to drink. You are pregnant, may be pregnant, or are planning to become pregnant. If you drink alcohol: Limit how much you have to: 0-1 drink a day for women. 0-2 drinks a day for men. Know how much alcohol is in your drink. In the [place] one drink equals one 12 oz bottle of beer (355 mL), one 5 oz glass of wine (148 mL), or one 1 oz glass of hard liquor (44 mL). Lifestyle  Work with your health care provider to maintain a healthy body weight or to lose weight. Ask what an ideal weight is for you. Get at least 30 minutes of exercise that causes your heart to beat faster (aerobic exercise) most days of the week. Activities may include walking, swimming, or biking. Include exercise to strengthen your muscles (resistance exercise), such as Pilates or lifting weights, as part of your weekly exercise routine. Try to do these types of exercises for 30 minutes at least 3 days a week. Do not use any products that contain nicotine or tobacco. These products include cigarettes, chewing tobacco, and vaping devices, such as e-cigarettes. If you need help quitting, ask your health care provider. Monitor your blood pressure at home as told by your health care provider. Keep all follow-up visits. This is important. Medicines Take over-the-counter and prescription medicines only as told by your health care provider. Follow directions carefully. Blood  pressure medicines must be taken as prescribed. Do not skip doses of blood pressure medicine. Doing this puts you at risk for problems and can make the medicine less effective. Ask your health care provider about side effects or reactions to medicines that you should watch for. Contact a health care provider if you: Think you are having a reaction to a medicine you are taking. Have headaches that keep coming back (recurring). Feel dizzy. Have swelling in your ankles. Have trouble with your vision. Get help right away if you: Develop a severe headache or confusion. Have unusual weakness or numbness. Feel faint. Have severe pain in your chest or abdomen. Vomit repeatedly. Have trouble breathing. These symptoms may be an emergency. Get help right away. Call 911. Do not wait to see if the symptoms will go away. Do not drive yourself to the hospital. Summary Hypertension is when the force of blood pumping through your arteries is too strong. If this condition is not controlled, it may put you at risk for serious complications. Your personal target blood pressure may vary depending on your medical conditions, your age, and other factors. For most people, a normal blood pressure is less than 120/80. Hypertension is treated with lifestyle changes, medicines, or a combination of both. Lifestyle changes include losing weight, eating a healthy,   low-sodium diet, exercising more, and limiting alcohol. This information is not intended to replace advice given to you by your health care provider. Make sure you discuss any questions you have with your health care provider. Document Revised: 07/24/2021 Document Reviewed: 07/24/2021 Elsevier Patient Education  2023 [employer]vier Inc.  

## 2022-09-22 ENCOUNTER — Other Ambulatory Visit: Payer: Self-pay | Admitting: Internal Medicine

## 2022-09-25 ENCOUNTER — Other Ambulatory Visit: Payer: Self-pay

## 2022-09-25 NOTE — Telephone Encounter (Signed)
Refilled 09/09/22 # 90 with 1 refill.

## 2022-10-08 IMAGING — CR DG KNEE COMPLETE 4+V*L*
1 series · 4 of 4 positions shown · non-contrast
Comparison: June 02, 2017

CLINICAL DATA: Status post trauma.

EXAM:
LEFT KNEE - COMPLETE 4+ VIEW

[Series 1: dg knee complete 4 views left · 0.14mm/px · 4 of 4 slices shown]
[im 1/4]
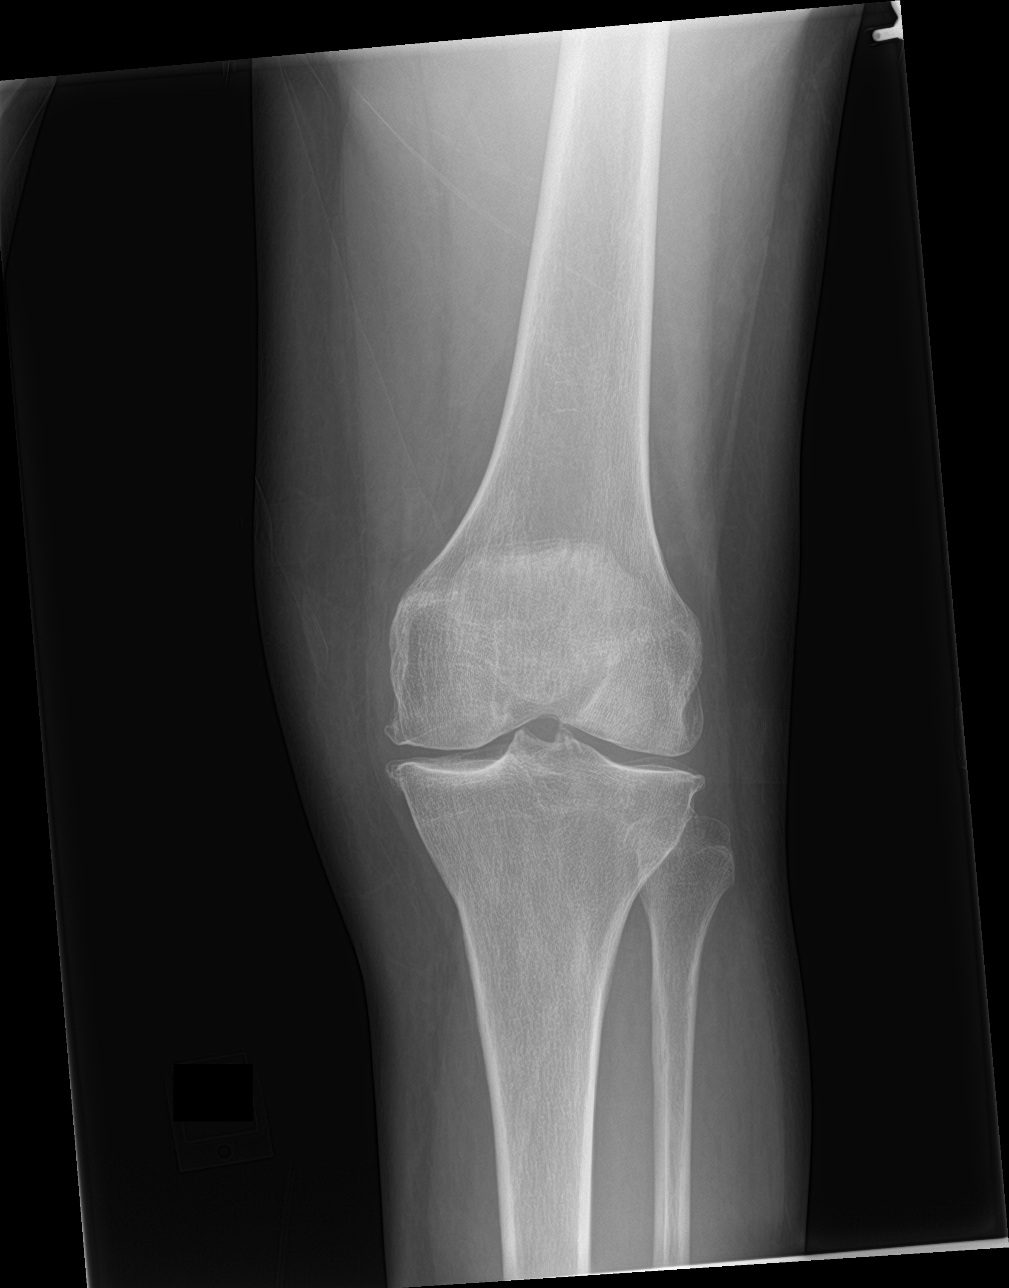
[im 2/4]
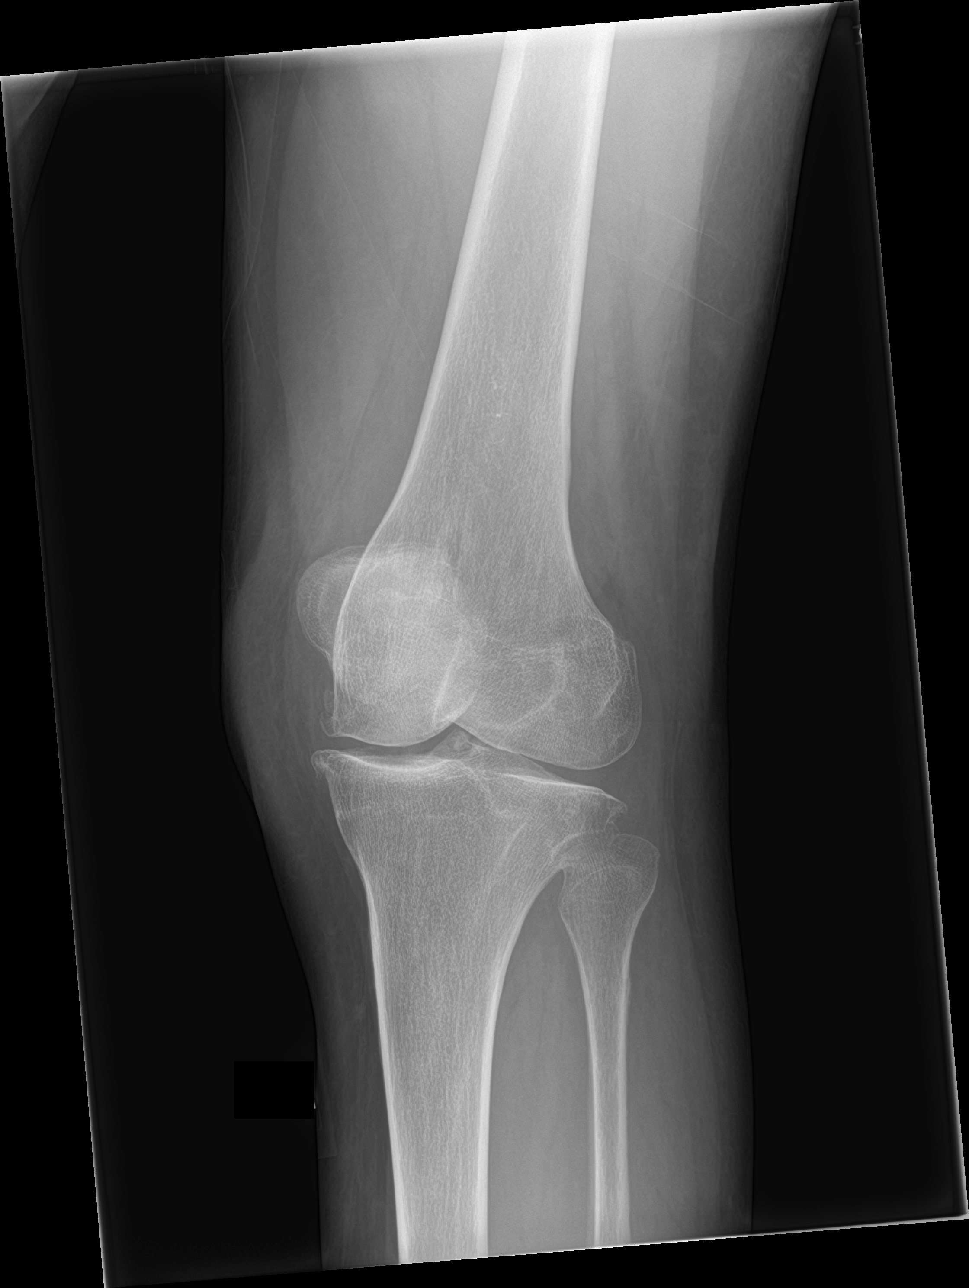
[im 3/4]
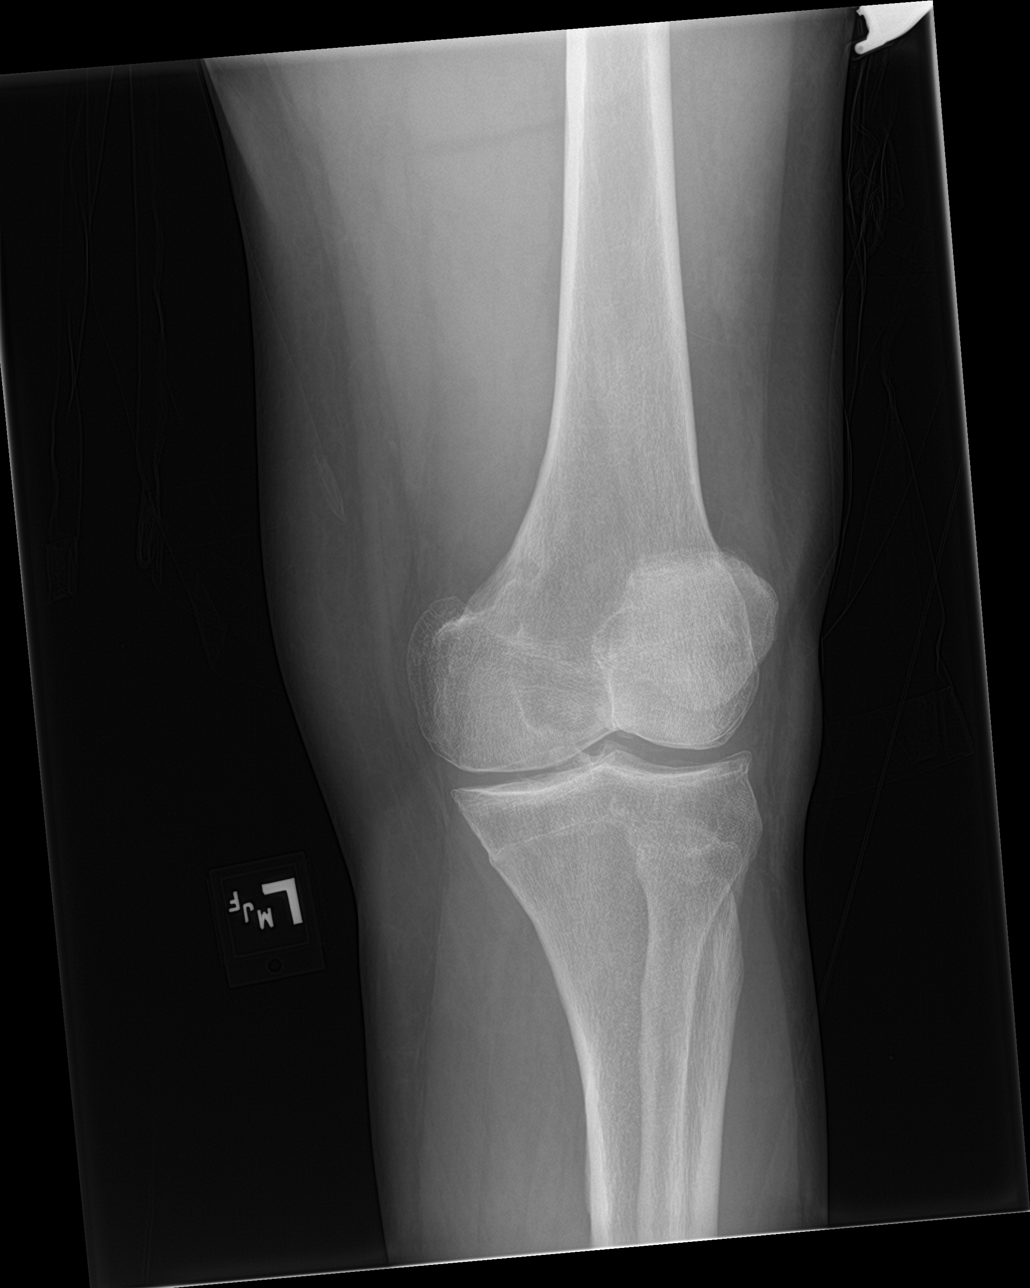
[im 4/4]
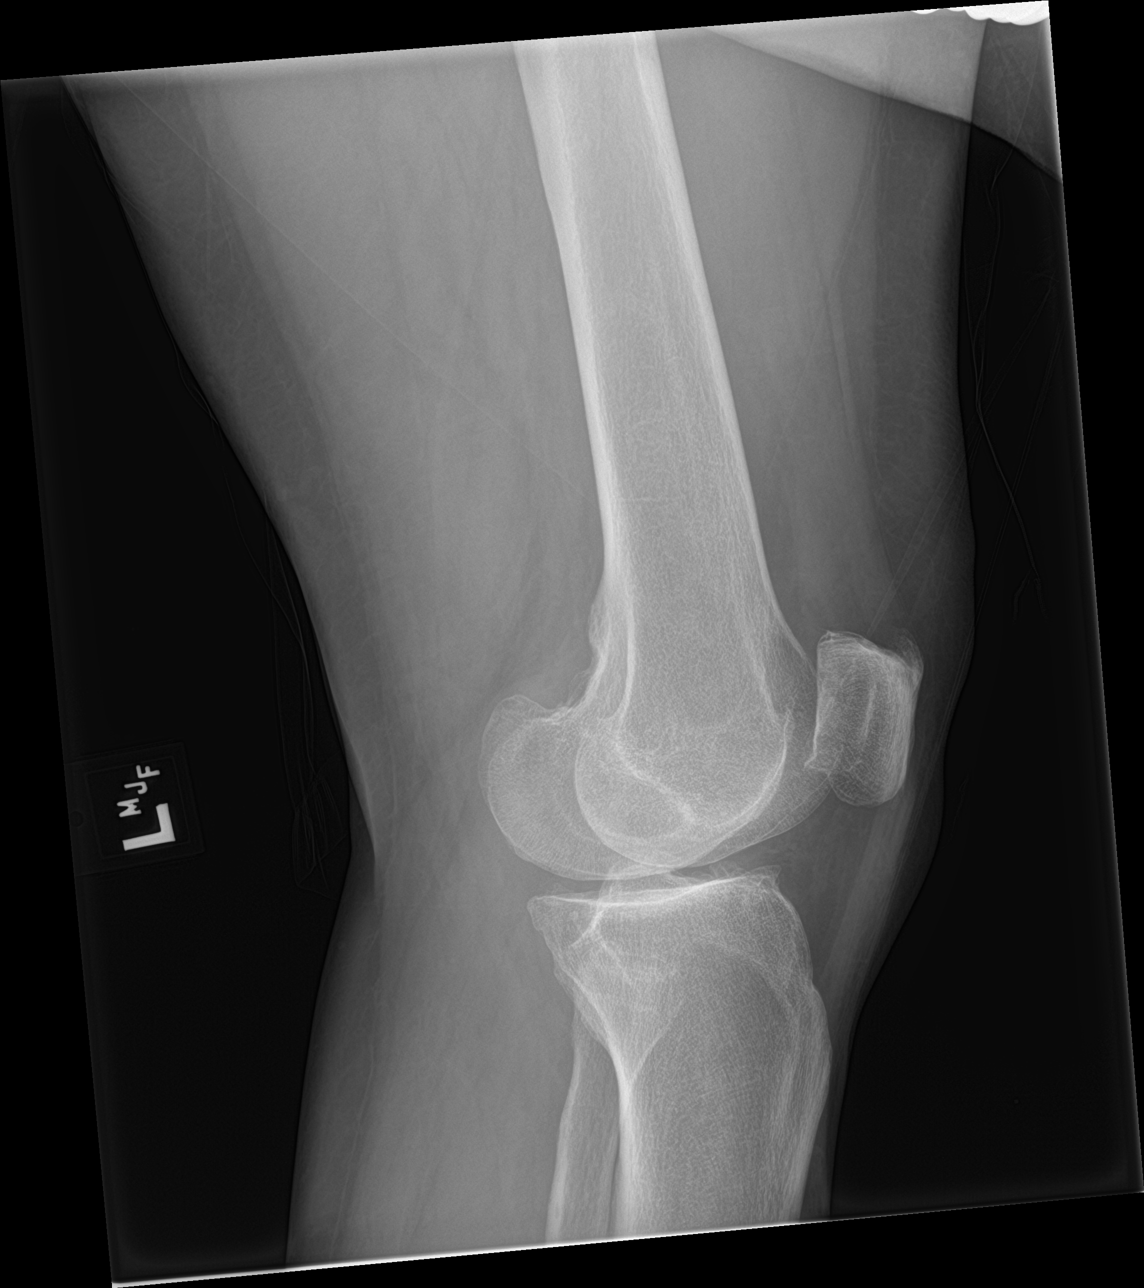

[4 of 4 positions shown; findings below may reference images not displayed]

FINDINGS: No evidence of fracture, dislocation, or joint effusion. Medial and
lateral marginal osteophyte formation is seen. Mild to moderate
severity medial and lateral tibiofemoral compartment space narrowing
is noted. Soft tissues are unremarkable.
IMPRESSION: 1. No acute osseous abnormality.
2. Mild to moderate severity degenerative changes.

## 2022-11-18 ENCOUNTER — Ambulatory Visit (INDEPENDENT_AMBULATORY_CARE_PROVIDER_SITE_OTHER): Payer: Self-pay | Admitting: Nurse Practitioner

## 2023-01-20 ENCOUNTER — Encounter: Payer: Self-pay | Admitting: Internal Medicine

## 2023-01-20 ENCOUNTER — Ambulatory Visit (INDEPENDENT_AMBULATORY_CARE_PROVIDER_SITE_OTHER): Payer: No Typology Code available for payment source | Admitting: Internal Medicine

## 2023-01-20 VITALS — BP 120/73 | HR 87 | Temp 98.8°F | Resp 18 | Ht 71.0 in | Wt 176.8 lb

## 2023-01-20 DIAGNOSIS — Z0001 Encounter for general adult medical examination with abnormal findings: Secondary | ICD-10-CM

## 2023-01-20 DIAGNOSIS — R7309 Other abnormal glucose: Secondary | ICD-10-CM

## 2023-01-20 NOTE — Progress Notes (Signed)
Subjective:    Patient ID: Amber Figueroa, female    DOB: 10/20/1960, 62 y.o.   MRN: [id number]  HPI  Patient presents to clinic today for her annual exam.  Flu: Never Tetanus: 05/2017 COVID: Moderna x 2 Shingrix: Never Pap smear: 12/2021 Mammogram: 05/2017 Bone density: 06/2017 Colon screening: 06/2017 Vision screening: as needed Dentist: no teeth  Diet: She does eat meat. She consumes some fruits and veggies. She does eat some fried foods. She drinks mostly tea, gatorade, soda. Exercise: None  Review of Systems     Past Medical History:  Diagnosis Date   Back pain    Hypertension     Current Outpatient Medications  Medication Sig Dispense Refill   albuterol (VENTOLIN HFA) 108 (90 Base) MCG/ACT inhaler Inhale 2 puffs into the lungs every 6 (six) hours as needed for wheezing or shortness of breath. 8 g 0   amLODipine (NORVASC) 10 MG tablet Take 1 tablet (10 mg total) by mouth daily. 90 tablet 1   No current facility-administered medications for this visit.    No Known Allergies  Family History  Problem Relation Age of Onset   Cancer Mother    Cancer Father        unknown type   Hypertension Sister    Hypertension Brother    Hyperlipidemia Brother    Breast cancer Maternal Grandmother        likely breast ca - mastectomy   Hypertension Brother    Diabetes Brother    Hypertension Brother    Heart attack Neg Hx    Stroke Neg Hx    Colon cancer Neg Hx    Ovarian cancer Neg Hx     Social History   Socioeconomic History   Marital status: Married    Spouse name: Not on file   Number of children: Not on file   Years of education: Not on file   Highest education level: Not on file  Occupational History   Not on file  Tobacco Use   Smoking status: Every Day    Packs/day: 0.50    Years: 1.00    Additional pack years: 0.00    Total pack years: 0.50    Types: Cigarettes   Smokeless tobacco: Never   Tobacco comments:    age [age] smoked 1/2 ppd, quit x  20 years, now smoking 1 year  Vaping Use   Vaping Use: Never used  Substance and Sexual Activity   Alcohol use: No   Drug use: No   Sexual activity: Never  Other Topics Concern   Not on file  Social History Narrative   Not on file   Social Determinants of Health   Financial Resource Strain: Not on file  Food Insecurity: Not on file  Transportation Needs: Not on file  Physical Activity: Not on file  Stress: Not on file  Social Connections: Not on file  Intimate Partner Violence: Not on file     Constitutional: Denies fever, malaise, fatigue, headache or abrupt weight changes.  HEENT: Denies eye pain, eye redness, ear pain, ringing in the ears, wax buildup, runny nose, nasal congestion, bloody nose, or sore throat. Respiratory: Denies difficulty breathing, shortness of breath, cough or sputum production.   Cardiovascular: Patient reports swelling in legs.  Denies chest pain, chest tightness, palpitations or swelling in the hands.  Gastrointestinal: Denies abdominal pain, bloating, constipation, diarrhea or blood in the stool.  GU: Denies urgency, frequency, pain with urination, burning sensation, blood in urine, odor  or discharge. Musculoskeletal: Denies decrease in range of motion, difficulty with gait, muscle pain or joint pain and swelling.  Skin: Denies redness, rashes, lesions or ulcercations.  Neurological: Denies dizziness, difficulty with memory, difficulty with speech or problems with balance and coordination.  Psych: Denies anxiety, depression, SI/HI.  No other specific complaints in a complete review of systems (except as listed in HPI above).  Objective:   Physical Exam  BP 120/73 (BP Location: Right Arm, Patient Position: Sitting, Cuff Size: Normal)   Pulse 87   Temp 98.8 F (37.1 C) (Oral)   Resp 18   Ht  (1.803 m)   Wt 176 lb 12.8 oz (80.2 kg)   SpO2 100%   BMI 24.66 kg/m   Wt Readings from Last 3 Encounters:  09/09/22 172 lb (78 kg)  08/26/22  179 lb (81.2 kg)  08/05/22 178 lb (80.7 kg)    General: Appears her stated age, well developed, well nourished in NAD. Skin: Warm, dry and intact. No rashes noted. HEENT: Head: normal shape and size; Eyes: sclera white, no icterus, conjunctiva pink, PERRLA and EOMs intact;  Neck:  Neck supple, trachea midline. No masses, lumps or thyromegaly present.  Cardiovascular: Normal rate and rhythm. S1,S2 noted.  No murmur, rubs or gallops noted. No JVD. 1+ BLE edema. No carotid bruits noted. Pulmonary/Chest: Normal effort and positive vesicular breath sounds. No respiratory distress. No wheezes, rales or ronchi noted.  Abdomen: Normal bowel sounds.  Musculoskeletal: Strength 5/5 BUE/BLE. No difficulty with gait.  Neurological: Alert and oriented. Cranial nerves II-XII grossly intact. Coordination normal.  Psychiatric: Mood and affect normal. Behavior is normal. Judgment and thought content normal.    BMET    Component Value Date/Time   NA 142 08/05/2022 1004   K 3.7 08/05/2022 1004   CL 107 08/05/2022 1004   CO2 30 08/05/2022 1004   GLUCOSE 98 08/05/2022 1004   BUN 11 08/05/2022 1004   CREATININE 0.78 08/05/2022 1004   CALCIUM 9.4 08/05/2022 1004   GFRNONAA >60 12/05/2021 0305   GFRNONAA 89 06/09/2017 0832   GFRAA >60 04/08/2019 0226   GFRAA 103 06/09/2017 0832    Lipid Panel     Component Value Date/Time   CHOL 180 08/05/2022 1004   TRIG 77 08/05/2022 1004   HDL 63 08/05/2022 1004   CHOLHDL 2.9 08/05/2022 1004   LDLCALC 100 (H) 08/05/2022 1004    CBC    Component Value Date/Time   WBC 4.3 08/05/2022 1004   RBC 4.21 08/05/2022 1004   HGB 12.4 08/05/2022 1004   HCT 36.5 08/05/2022 1004   PLT 291 08/05/2022 1004   MCV 86.7 08/05/2022 1004   MCH 29.5 08/05/2022 1004   MCHC 34.0 08/05/2022 1004   RDW 14.7 08/05/2022 1004   LYMPHSABS 1,410 06/09/2017 0832   EOSABS 291 06/09/2017 0832   BASOSABS 41 06/09/2017 0832    Hgb A1C Lab Results  Component Value Date   HGBA1C  5.3 01/16/2022            Assessment & Plan:   Preventative Health Maintenance:  Encouraged her to get a flu shot in the fall Tetanus UTD Encouraged her to get her COVID booster Discussed Shingrix vaccine, she will check coverage with her insurance company and schedule a visit if she would like to have this done Pap smear UTD Mammogram and bone density were previously ordered-she just needs to call and get this scheduled Colon screening UTD Encourage her to consume a balanced diet  and exercise regimen Advised her to see an eye doctor and dentist annually We will check CBC, c-Met, lipid, A1c today  RTC in 6 months, follow-up chronic conditions Nicki Reaper, NP

## 2023-01-20 NOTE — Patient Instructions (Signed)
Health Maintenance for Postmenopausal Women Menopause is a normal process in which your ability to get pregnant comes to an end. This process happens slowly over many months or years, usually between the ages of 48 and 55. Menopause is complete when you have missed your menstrual period for 12 months. It is important to talk with your health care provider about some of the most common conditions that affect women after menopause (postmenopausal women). These include heart disease, cancer, and bone loss (osteoporosis). Adopting a healthy lifestyle and getting preventive care can help to promote your health and wellness. The actions you take can also lower your chances of developing some of these common conditions. What are the signs and symptoms of menopause? During menopause, you may have the following symptoms: Hot flashes. These can be moderate or severe. Night sweats. Decrease in sex drive. Mood swings. Headaches. Tiredness (fatigue). Irritability. Memory problems. Problems falling asleep or staying asleep. Talk with your health care provider about treatment options for your symptoms. Do I need hormone replacement therapy? Hormone replacement therapy is effective in treating symptoms that are caused by menopause, such as hot flashes and night sweats. Hormone replacement carries certain risks, especially as you become older. If you are thinking about using estrogen or estrogen with progestin, discuss the benefits and risks with your health care provider. How can I reduce my risk for heart disease and stroke? The risk of heart disease, heart attack, and stroke increases as you age. One of the causes may be a change in the body's hormones during menopause. This can affect how your body uses dietary fats, triglycerides, and cholesterol. Heart attack and stroke are medical emergencies. There are many things that you can do to help prevent heart disease and stroke. Watch your blood pressure High  blood pressure causes heart disease and increases the risk of stroke. This is more likely to develop in people who have high blood pressure readings or are overweight. Have your blood pressure checked: Every 3-5 years if you are 18-39 years of age. Every year if you are 40 years old or older. Eat a healthy diet  Eat a diet that includes plenty of vegetables, fruits, low-fat dairy products, and lean protein. Do not eat a lot of foods that are high in solid fats, added sugars, or sodium. Get regular exercise Get regular exercise. This is one of the most important things you can do for your health. Most adults should: Try to exercise for at least 150 minutes each week. The exercise should increase your heart rate and make you sweat (moderate-intensity exercise). Try to do strengthening exercises at least twice each week. Do these in addition to the moderate-intensity exercise. Spend less time sitting. Even light physical activity can be beneficial. Other tips Work with your health care provider to achieve or maintain a healthy weight. Do not use any products that contain nicotine or tobacco. These products include cigarettes, chewing tobacco, and vaping devices, such as e-cigarettes. If you need help quitting, ask your health care provider. Know your numbers. Ask your health care provider to check your cholesterol and your blood sugar (glucose). Continue to have your blood tested as directed by your health care provider. Do I need screening for cancer? Depending on your health history and family history, you may need to have cancer screenings at different stages of your life. This may include screening for: Breast cancer. Cervical cancer. Lung cancer. Colorectal cancer. What is my risk for osteoporosis? After menopause, you may be   at increased risk for osteoporosis. Osteoporosis is a condition in which bone destruction happens more quickly than new bone creation. To help prevent osteoporosis or  the bone fractures that can happen because of osteoporosis, you may take the following actions: If you are 19-50 years old, get at least 1,000 mg of calcium and at least 600 international units (IU) of vitamin D per day. If you are older than age 50 but younger than age 70, get at least 1,200 mg of calcium and at least 600 international units (IU) of vitamin D per day. If you are older than age 70, get at least 1,200 mg of calcium and at least 800 international units (IU) of vitamin D per day. Smoking and drinking excessive alcohol increase the risk of osteoporosis. Eat foods that are rich in calcium and vitamin D, and do weight-bearing exercises several times each week as directed by your health care provider. How does menopause affect my mental health? Depression may occur at any age, but it is more common as you become older. Common symptoms of depression include: Feeling depressed. Changes in sleep patterns. Changes in appetite or eating patterns. Feeling an overall lack of motivation or enjoyment of activities that you previously enjoyed. Frequent crying spells. Talk with your health care provider if you think that you are experiencing any of these symptoms. General instructions See your health care provider for regular wellness exams and vaccines. This may include: Scheduling regular health, dental, and eye exams. Getting and maintaining your vaccines. These include: Influenza vaccine. Get this vaccine each year before the flu season begins. Pneumonia vaccine. Shingles vaccine. Tetanus, diphtheria, and pertussis (Tdap) booster vaccine. Your health care provider may also recommend other immunizations. Tell your health care provider if you have ever been abused or do not feel safe at home. Summary Menopause is a normal process in which your ability to get pregnant comes to an end. This condition causes hot flashes, night sweats, decreased interest in sex, mood swings, headaches, or lack  of sleep. Treatment for this condition may include hormone replacement therapy. Take actions to keep yourself healthy, including exercising regularly, eating a healthy diet, watching your weight, and checking your blood pressure and blood sugar levels. Get screened for cancer and depression. Make sure that you are up to date with all your vaccines. This information is not intended to replace advice given to you by your health care provider. Make sure you discuss any questions you have with your health care provider. Document Revised: 02/05/2021 Document Reviewed: 02/05/2021 Elsevier Patient Education  2023 Elsevier Inc.  

## 2023-01-21 ENCOUNTER — Telehealth: Payer: Self-pay

## 2023-01-21 LAB — COMPLETE METABOLIC PANEL WITH GFR
AG Ratio: 1.5 (calc) (ref 1.0–2.5)
ALT: 15 U/L (ref 6–29)
AST: 17 U/L (ref 10–35)
Albumin: 4.3 g/dL (ref 3.6–5.1)
Alkaline phosphatase (APISO): 77 U/L (ref 37–153)
BUN: 10 mg/dL (ref 7–25)
CO2: 32 mmol/L (ref 20–32)
Calcium: 9.9 mg/dL (ref 8.6–10.4)
Chloride: 104 mmol/L (ref 98–110)
Creat: 0.98 mg/dL (ref 0.50–1.05)
Globulin: 2.9 g/dL (calc) (ref 1.9–3.7)
Glucose, Bld: 97 mg/dL (ref 65–99)
Potassium: 4.7 mmol/L (ref 3.5–5.3)
Sodium: 142 mmol/L (ref 135–146)
Total Bilirubin: 0.6 mg/dL (ref 0.2–1.2)
Total Protein: 7.2 g/dL (ref 6.1–8.1)
eGFR: 65 mL/min/{1.73_m2} (ref >=60)

## 2023-01-21 LAB — CBC
HCT: 41.6 % (ref 35.0–45.0)
Hemoglobin: 13.8 g/dL (ref 11.7–15.5)
MCH: 28.4 pg (ref 27.0–33.0)
MCHC: 33.2 g/dL (ref 32.0–36.0)
MCV: 85.6 fL (ref 80.0–100.0)
MPV: 10.1 fL (ref 7.5–12.5)
Platelets: 264 10*3/uL (ref 140–400)
RBC: 4.86 10*6/uL (ref 3.80–5.10)
RDW: 14.2 % (ref 11.0–15.0)
WBC: 4.7 10*3/uL (ref 3.8–10.8)

## 2023-01-21 LAB — LIPID PANEL
Cholesterol: 205 mg/dL — ABNORMAL HIGH (ref <200)
HDL: 68 mg/dL (ref >=50)
LDL Cholesterol (Calc): 122 mg/dL (calc) — ABNORMAL HIGH
Non-HDL Cholesterol (Calc): 137 mg/dL (calc) — ABNORMAL HIGH (ref <130)
Total CHOL/HDL Ratio: 3 (calc) (ref <5.0)
Triglycerides: 55 mg/dL (ref <150)

## 2023-01-21 LAB — HEMOGLOBIN A1C
Hgb A1c MFr Bld: 5.5 % of total Hgb (ref <5.7)
Mean Plasma Glucose: 111 mg/dL
eAG (mmol/L): 6.2 mmol/L

## 2023-01-21 NOTE — Telephone Encounter (Signed)
-----   Message from Lorre Munroe, NP sent at 01/21/2023  9:13 AM EDT ----- Cholesterol is elevated.  I would recommend low-dose cholesterol-lowering medication at this time.  Please let me know if she is agreeable and I will send this in.  Blood counts are normal.  Liver and kidney function is normal.  She does not have diabetes.

## 2023-01-21 NOTE — Telephone Encounter (Signed)
Attempted to reach patient but no answer or voicemail.

## 2023-02-05 ENCOUNTER — Ambulatory Visit
Admission: RE | Admit: 2023-02-05 | Discharge: 2023-02-05 | Disposition: A | Discharge status: Home / Self Care | Payer: PRIVATE HEALTH INSURANCE | Source: Ambulatory Visit | Attending: Internal Medicine | Admitting: Internal Medicine

## 2023-02-05 DIAGNOSIS — Z1231 Encounter for screening mammogram for malignant neoplasm of breast: Secondary | ICD-10-CM | POA: Insufficient documentation

## 2023-03-28 ENCOUNTER — Other Ambulatory Visit: Payer: Self-pay | Admitting: Internal Medicine

## 2023-03-31 NOTE — Telephone Encounter (Signed)
Requested Prescriptions  Pending Prescriptions Disp Refills   amLODipine (NORVASC) 10 MG tablet [Pharmacy Med Name: AMLODIPINE BESYLATE 10MG  TABLETS] 90 tablet 1    Sig: TAKE 1 TABLET(10 MG) BY MOUTH DAILY     Cardiovascular: Calcium Channel Blockers 2 Passed - 03/28/2023  3:25 AM      Passed - Last BP in normal range    BP Readings from Last 1 Encounters:  01/20/23 120/73         Passed - Last Heart Rate in normal range    Pulse Readings from Last 1 Encounters:  01/20/23 87         Passed - Valid encounter within last 6 months    Recent Outpatient Visits           2 months ago Encounter for general adult medical examination with abnormal findings   [place] [facility] [place], Salvadore Oxford, NP   6 months ago Primary hypertension   [place] [facility] [place], Salvadore Oxford, NP   7 months ago Primary hypertension   [place] [facility] [place], Salvadore Oxford, NP   7 months ago Pure hypercholesterolemia   [place] [facility] [place], Salvadore Oxford, NP   1 year ago Cellulitis of right lower extremity    [facility] [place], Salvadore Oxford, NP       Future Appointments             In 3 months Baity, Salvadore Oxford, NP  [facility]

## 2023-07-22 ENCOUNTER — Ambulatory Visit: Payer: No Typology Code available for payment source | Admitting: Internal Medicine

## 2023-10-12 ENCOUNTER — Other Ambulatory Visit: Payer: Self-pay | Admitting: Internal Medicine

## 2023-10-14 ENCOUNTER — Encounter: Payer: Self-pay | Admitting: Internal Medicine

## 2023-10-14 ENCOUNTER — Ambulatory Visit: Payer: No Typology Code available for payment source | Admitting: Internal Medicine

## 2023-10-14 VITALS — BP 118/72 | Ht 71.0 in | Wt 183.2 lb

## 2023-10-14 DIAGNOSIS — I10 Essential (primary) hypertension: Secondary | ICD-10-CM

## 2023-10-14 DIAGNOSIS — E7800 Pure hypercholesterolemia, unspecified: Secondary | ICD-10-CM | POA: Diagnosis not present

## 2023-10-14 DIAGNOSIS — R739 Hyperglycemia, unspecified: Secondary | ICD-10-CM

## 2023-10-14 DIAGNOSIS — D649 Anemia, unspecified: Secondary | ICD-10-CM

## 2023-10-14 DIAGNOSIS — I890 Lymphedema, not elsewhere classified: Secondary | ICD-10-CM

## 2023-10-14 MED ORDER — AMLODIPINE BESYLATE 10 MG PO TABS: 10.0000 mg | ORAL_TABLET | Freq: Every day | ORAL | 1 refills | Status: DC

## 2023-10-14 NOTE — Patient Instructions (Signed)

## 2023-10-14 NOTE — Telephone Encounter (Signed)
Called pt and scheduled follow up appt

## 2023-10-14 NOTE — Assessment & Plan Note (Signed)
Continue use of TEDs Encourage elevation

## 2023-10-14 NOTE — Assessment & Plan Note (Signed)
 Will check CBC at annual exam Not taking oral iron

## 2023-10-14 NOTE — Telephone Encounter (Signed)
 Requested Prescriptions  Pending Prescriptions Disp Refills   amLODipine  (NORVASC ) 10 MG tablet [Pharmacy Med Name: AMLODIPINE  BESYLATE 10 MG TAB] 90 tablet 0    Sig: TAKE 1 TABLET BY MOUTH DAILY     Cardiovascular: Calcium Channel Blockers 2 Failed - 10/14/2023 10:43 AM      Failed - Valid encounter within last 6 months    Recent Outpatient Visits           8 months ago Encounter for general adult medical examination with abnormal findings   [place] [facility] [place], Angeline ORN, NP   1 year ago Primary hypertension   [place] [facility] [place], Angeline ORN, NP   1 year ago Primary hypertension   [facility] [place], Angeline ORN, NP   1 year ago Pure hypercholesterolemia   [place] [facility] [place], Angeline ORN, NP   1 year ago Cellulitis of right lower extremity   [place] [facility] [place], Angeline ORN, NP       Future Appointments             Today [place], Angeline ORN, NP [facility]            Passed - Last BP in normal range    BP Readings from Last 1 Encounters:  01/20/23 120/73         Passed - Last Heart Rate in normal range    Pulse Readings from Last 1 Encounters:  01/20/23 87

## 2023-10-14 NOTE — Assessment & Plan Note (Signed)
Will check lipid profile at annual exam Encouraged her to consume a low fat diet

## 2023-10-14 NOTE — Assessment & Plan Note (Signed)
Controlled on Amlodipine, refilled today Reinforced DASH diet We will monitor

## 2023-10-14 NOTE — Progress Notes (Signed)
 Subjective:    Patient ID: Amber Figueroa, female    DOB: 06/27/1961, 63 y.o.   MRN: [id number]  HPI  Patient presents to clinic today for follow-up of chronic conditions.  Lymphedema: She is not currently taking any medications for this but is wearing TED hose.  She has seen vascular in the past for the same.  HTN: Her BP today is 118/72.  She is taking amlodipine  as prescribed.  ECG from 03/2019 reviewed.  Anemia: Her last H/H was 13.8/41.6, 12/2022.  She is not taking any oral iron.  She does not follow with hematology.  HLD: Her last LDL was 122, triglycerides 55, 12/2022.  She is not taking any cholesterol-lowering medication at this time.  She does not consume a low-fat diet.  Review of Systems     Past Medical History:  Diagnosis Date   Back pain    Hypertension     Current Outpatient Medications  Medication Sig Dispense Refill   albuterol  (VENTOLIN  HFA) 108 (90 Base) MCG/ACT inhaler Inhale 2 puffs into the lungs every 6 (six) hours as needed for wheezing or shortness of breath. 8 g 0   amLODipine  (NORVASC ) 10 MG tablet TAKE 1 TABLET BY MOUTH DAILY 90 tablet 0   No current facility-administered medications for this visit.    No Known Allergies  Family History  Problem Relation Age of Onset   Cancer Mother    Cancer Father        unknown type   Hypertension Sister    Hypertension Brother    Hyperlipidemia Brother    Breast cancer Maternal Grandmother        likely breast ca - mastectomy   Hypertension Brother    Diabetes Brother    Hypertension Brother    Heart attack Neg Hx    Stroke Neg Hx    Colon cancer Neg Hx    Ovarian cancer Neg Hx     Social History   Socioeconomic History   Marital status: Married    Spouse name: Not on file   Number of children: Not on file   Years of education: Not on file   Highest education level: Not on file  Occupational History   Occupation: warehouse  Tobacco Use   Smoking status: Every Day    Current packs/day:  1.00    Average packs/day: 1 pack/day for 1 year (1.0 ttl pk-yrs)    Types: Cigarettes   Smokeless tobacco: Never   Tobacco comments:    age [age] smoked 1/2 ppd, quit x 20 years, now smoking 1 year  Vaping Use   Vaping status: Never Used  Substance and Sexual Activity   Alcohol use: No   Drug use: No   Sexual activity: Never  Other Topics Concern   Not on file  Social History Narrative   Not on file   Social Drivers of Health   Financial Resource Strain: Not on file  Food Insecurity: Not on file  Transportation Needs: Not on file  Physical Activity: Not on file  Stress: Not on file  Social Connections: Not on file  Intimate Partner Violence: Not on file     Constitutional: Denies fever, malaise, fatigue, headache or abrupt weight changes.  HEENT: Denies eye pain, eye redness, ear pain, ringing in the ears, wax buildup, runny nose, nasal congestion, bloody nose, or sore throat. Respiratory: Denies difficulty breathing, shortness of breath, cough or sputum production.   Cardiovascular: Patient reports swelling in legs.  Denies chest pain,  chest tightness, palpitations or swelling in the hands.  Gastrointestinal: Denies abdominal pain, bloating, constipation, diarrhea or blood in the stool.  GU: Denies urgency, frequency, pain with urination, burning sensation, blood in urine, odor or discharge. Musculoskeletal: Denies decrease in range of motion, difficulty with gait, muscle pain or joint pain or swelling.  Skin: Denies redness, rashes, lesions or ulcercations.  Neurological: Denies dizziness, difficulty with memory, difficulty with speech or problems with balance and coordination.  Psych: Denies anxiety, depression, SI/HI.  No other specific complaints in a complete review of systems (except as listed in HPI above).  Objective:   Physical Exam  BP 118/72 (BP Location: Left Arm, Patient Position: Sitting, Cuff Size: Normal)   Ht 5' 11 (1.803 m)   Wt 183 lb 3.2 oz (83.1  kg)   BMI 25.55 kg/m    Wt Readings from Last 3 Encounters:  01/20/23 176 lb 12.8 oz (80.2 kg)  09/09/22 172 lb (78 kg)  08/26/22 179 lb (81.2 kg)    General: Appears her stated age, well developed, well nourished in NAD. Skin: Warm, dry and intact.  HEENT: Head: normal shape and size; Eyes: sclera white, no icterus, conjunctiva pink, PERRLA and EOMs intact;  Cardiovascular: Normal rate and rhythm. S1,S2 noted.  No murmur, rubs or gallops noted. Trace BLE edema. No carotid bruits noted. Pulmonary/Chest: Normal effort and positive vesicular breath sounds. No respiratory distress. No wheezes, rales or ronchi noted.  Musculoskeletal:  No difficulty with gait.  Neurological: Alert and oriented.  Coordination normal.    BMET    Component Value Date/Time   NA 142 01/20/2023 1012   K 4.7 01/20/2023 1012   CL 104 01/20/2023 1012   CO2 32 01/20/2023 1012   GLUCOSE 97 01/20/2023 1012   BUN 10 01/20/2023 1012   CREATININE 0.98 01/20/2023 1012   CALCIUM 9.9 01/20/2023 1012   GFRNONAA >60 12/05/2021 0305   GFRNONAA 89 06/09/2017 0832   GFRAA >60 04/08/2019 0226   GFRAA 103 06/09/2017 0832    Lipid Panel     Component Value Date/Time   CHOL 205 (H) 01/20/2023 1012   TRIG 55 01/20/2023 1012   HDL 68 01/20/2023 1012   CHOLHDL 3.0 01/20/2023 1012   LDLCALC 122 (H) 01/20/2023 1012    CBC    Component Value Date/Time   WBC 4.7 01/20/2023 1012   RBC 4.86 01/20/2023 1012   HGB 13.8 01/20/2023 1012   HCT 41.6 01/20/2023 1012   PLT 264 01/20/2023 1012   MCV 85.6 01/20/2023 1012   MCH 28.4 01/20/2023 1012   MCHC 33.2 01/20/2023 1012   RDW 14.2 01/20/2023 1012   LYMPHSABS 1,410 06/09/2017 0832   EOSABS 291 06/09/2017 0832   BASOSABS 41 06/09/2017 0832    Hgb A1C Lab Results  Component Value Date   HGBA1C 5.5 01/20/2023            Assessment & Plan:     RTC in 4 months for your annual exam Angeline Laura, NP

## 2024-02-03 ENCOUNTER — Ambulatory Visit: Payer: Self-pay | Admitting: Internal Medicine

## 2024-02-03 NOTE — Progress Notes (Deleted)
 Subjective:    Patient ID: Amber Figueroa, female    DOB: 10-Sep-1961, 63 y.o.   MRN: [id number]  HPI  Patient presents to clinic today for her annual exam.  Flu: Never Tetanus: 05/2017 COVID: Moderna x 2 Shingrix: Never Pap smear: 12/2021 Mammogram: 01/2023 Bone density: 06/2017 Colon screening: 06/2017 Vision screening: as needed Dentist: no teeth  Diet: She does eat meat. She consumes some fruits and veggies. She does eat some fried foods. She drinks mostly tea, gatorade, soda. Exercise: None  Review of Systems     Past Medical History:  Diagnosis Date   Back pain    Hypertension     Current Outpatient Medications  Medication Sig Dispense Refill   albuterol  (VENTOLIN  HFA) 108 (90 Base) MCG/ACT inhaler Inhale 2 puffs into the lungs every 6 (six) hours as needed for wheezing or shortness of breath. 8 g 0   amLODipine  (NORVASC ) 10 MG tablet Take 1 tablet (10 mg total) by mouth daily. 90 tablet 1   No current facility-administered medications for this visit.    No Known Allergies  Family History  Problem Relation Age of Onset   Cancer Mother    Cancer Father        unknown type   Hypertension Sister    Hypertension Brother    Hyperlipidemia Brother    Breast cancer Maternal Grandmother        likely breast ca - mastectomy   Hypertension Brother    Diabetes Brother    Hypertension Brother    Heart attack Neg Hx    Stroke Neg Hx    Colon cancer Neg Hx    Ovarian cancer Neg Hx     Social History   Socioeconomic History   Marital status: Married    Spouse name: Not on file   Number of children: Not on file   Years of education: Not on file   Highest education level: Not on file  Occupational History   Occupation: warehouse  Tobacco Use   Smoking status: Every Day    Current packs/day: 1.00    Average packs/day: 1 pack/day for 1 year (1.0 ttl pk-yrs)    Types: Cigarettes   Smokeless tobacco: Never   Tobacco comments:    age [age] smoked 1/2 ppd,  quit x 20 years, now smoking 1 year  Vaping Use   Vaping status: Never Used  Substance and Sexual Activity   Alcohol use: No   Drug use: No   Sexual activity: Never  Other Topics Concern   Not on file  Social History Narrative   Not on file   Social Drivers of Health   Financial Resource Strain: Not on file  Food Insecurity: Not on file  Transportation Needs: Not on file  Physical Activity: Not on file  Stress: Not on file  Social Connections: Not on file  Intimate Partner Violence: Not on file     Constitutional: Denies fever, malaise, fatigue, headache or abrupt weight changes.  HEENT: Denies eye pain, eye redness, ear pain, ringing in the ears, wax buildup, runny nose, nasal congestion, bloody nose, or sore throat. Respiratory: Denies difficulty breathing, shortness of breath, cough or sputum production.   Cardiovascular: Patient reports swelling in legs.  Denies chest pain, chest tightness, palpitations or swelling in the hands.  Gastrointestinal: Denies abdominal pain, bloating, constipation, diarrhea or blood in the stool.  GU: Denies urgency, frequency, pain with urination, burning sensation, blood in urine, odor or discharge. Musculoskeletal: Denies decrease in  range of motion, difficulty with gait, muscle pain or joint pain and swelling.  Skin: Denies redness, rashes, lesions or ulcercations.  Neurological: Denies dizziness, difficulty with memory, difficulty with speech or problems with balance and coordination.  Psych: Denies anxiety, depression, SI/HI.  No other specific complaints in a complete review of systems (except as listed in HPI above).  Objective:   Physical Exam  There were no vitals taken for this visit.  Wt Readings from Last 3 Encounters:  10/14/23 183 lb 3.2 oz (83.1 kg)  01/20/23 176 lb 12.8 oz (80.2 kg)  09/09/22 172 lb (78 kg)    General: Appears her stated age, well developed, well nourished in NAD. Skin: Warm, dry and intact. No rashes  noted. HEENT: Head: normal shape and size; Eyes: sclera white, no icterus, conjunctiva pink, PERRLA and EOMs intact;  Neck:  Neck supple, trachea midline. No masses, lumps or thyromegaly present.  Cardiovascular: Normal rate and rhythm. S1,S2 noted.  No murmur, rubs or gallops noted. No JVD. 1+ BLE edema. No carotid bruits noted. Pulmonary/Chest: Normal effort and positive vesicular breath sounds. No respiratory distress. No wheezes, rales or ronchi noted.  Abdomen: Normal bowel sounds.  Musculoskeletal: Strength 5/5 BUE/BLE. No difficulty with gait.  Neurological: Alert and oriented. Cranial nerves II-XII grossly intact. Coordination normal.  Psychiatric: Mood and affect normal. Behavior is normal. Judgment and thought content normal.    BMET    Component Value Date/Time   NA 142 01/20/2023 1012   K 4.7 01/20/2023 1012   CL 104 01/20/2023 1012   CO2 32 01/20/2023 1012   GLUCOSE 97 01/20/2023 1012   BUN 10 01/20/2023 1012   CREATININE 0.98 01/20/2023 1012   CALCIUM 9.9 01/20/2023 1012   GFRNONAA >60 12/05/2021 0305   GFRNONAA 89 06/09/2017 0832   GFRAA >60 04/08/2019 0226   GFRAA 103 06/09/2017 0832    Lipid Panel     Component Value Date/Time   CHOL 205 (H) 01/20/2023 1012   TRIG 55 01/20/2023 1012   HDL 68 01/20/2023 1012   CHOLHDL 3.0 01/20/2023 1012   LDLCALC 122 (H) 01/20/2023 1012    CBC    Component Value Date/Time   WBC 4.7 01/20/2023 1012   RBC 4.86 01/20/2023 1012   HGB 13.8 01/20/2023 1012   HCT 41.6 01/20/2023 1012   PLT 264 01/20/2023 1012   MCV 85.6 01/20/2023 1012   MCH 28.4 01/20/2023 1012   MCHC 33.2 01/20/2023 1012   RDW 14.2 01/20/2023 1012   LYMPHSABS 1,410 06/09/2017 0832   EOSABS 291 06/09/2017 0832   BASOSABS 41 06/09/2017 0832    Hgb A1C Lab Results  Component Value Date   HGBA1C 5.5 01/20/2023            Assessment & Plan:   Preventative Health Maintenance:  Encouraged her to get a flu shot in the fall Tetanus  UTD Encouraged her to get her COVID booster Discussed Shingrix vaccine, she will check coverage with her insurance company and schedule a visit if she would like to have this done Pap smear UTD Mammogram and bone density ordered-she will call to schedule Colon screening UTD Encourage her to consume a balanced diet and exercise regimen Advised her to see an eye doctor and dentist annually We will check CBC, c-Met, lipid, A1c today  RTC in 6 months, follow-up chronic conditions Helayne Lo, NP

## 2024-02-06 ENCOUNTER — Encounter: Payer: Self-pay | Admitting: Internal Medicine

## 2024-02-06 ENCOUNTER — Ambulatory Visit (INDEPENDENT_AMBULATORY_CARE_PROVIDER_SITE_OTHER): Admitting: Internal Medicine

## 2024-02-06 VITALS — BP 122/70 | Ht 71.0 in | Wt 193.2 lb

## 2024-02-06 DIAGNOSIS — Z6825 Body mass index (BMI) 25.0-25.9, adult: Secondary | ICD-10-CM | POA: Insufficient documentation

## 2024-02-06 DIAGNOSIS — Z0001 Encounter for general adult medical examination with abnormal findings: Secondary | ICD-10-CM | POA: Diagnosis not present

## 2024-02-06 DIAGNOSIS — Z780 Asymptomatic menopausal state: Secondary | ICD-10-CM

## 2024-02-06 DIAGNOSIS — E7800 Pure hypercholesterolemia, unspecified: Secondary | ICD-10-CM | POA: Diagnosis not present

## 2024-02-06 DIAGNOSIS — Z1231 Encounter for screening mammogram for malignant neoplasm of breast: Secondary | ICD-10-CM | POA: Diagnosis not present

## 2024-02-06 DIAGNOSIS — E663 Overweight: Secondary | ICD-10-CM

## 2024-02-06 DIAGNOSIS — R739 Hyperglycemia, unspecified: Secondary | ICD-10-CM

## 2024-02-06 DIAGNOSIS — Z6826 Body mass index (BMI) 26.0-26.9, adult: Secondary | ICD-10-CM | POA: Insufficient documentation

## 2024-02-06 NOTE — Progress Notes (Signed)
 Subjective:    Patient ID: Amber Figueroa, female    DOB: 01/18/1961, 63 y.o.   MRN: [id number]  HPI  Patient presents to clinic today for her annual exam.  Flu: Never Tetanus: 05/2017 COVID: Moderna x 2 Shingrix: Never Pneumovax: Never Pap smear: 12/2021 Mammogram: 01/2023 Bone density: 06/2017 Colon screening: 06/2017 Vision screening: as needed Dentist: no teeth  Diet: She does eat meat. She consumes some fruits and veggies. She does eat some fried foods. She drinks mostly tea, gatorade, soda. Exercise: None  Review of Systems     Past Medical History:  Diagnosis Date   Back pain    Hypertension     Current Outpatient Medications  Medication Sig Dispense Refill   albuterol  (VENTOLIN  HFA) 108 (90 Base) MCG/ACT inhaler Inhale 2 puffs into the lungs every 6 (six) hours as needed for wheezing or shortness of breath. 8 g 0   amLODipine  (NORVASC ) 10 MG tablet Take 1 tablet (10 mg total) by mouth daily. 90 tablet 1   No current facility-administered medications for this visit.    No Known Allergies  Family History  Problem Relation Age of Onset   Cancer Mother    Cancer Father        unknown type   Hypertension Sister    Hypertension Brother    Hyperlipidemia Brother    Breast cancer Maternal Grandmother        likely breast ca - mastectomy   Hypertension Brother    Diabetes Brother    Hypertension Brother    Heart attack Neg Hx    Stroke Neg Hx    Colon cancer Neg Hx    Ovarian cancer Neg Hx     Social History   Socioeconomic History   Marital status: Married    Spouse name: Not on file   Number of children: Not on file   Years of education: Not on file   Highest education level: Not on file  Occupational History   Occupation: warehouse  Tobacco Use   Smoking status: Every Day    Current packs/day: 1.00    Average packs/day: 1 pack/day for 1 year (1.0 ttl pk-yrs)    Types: Cigarettes   Smokeless tobacco: Never   Tobacco comments:    age [age]  smoked 1/2 ppd, quit x 20 years, now smoking 1 year  Vaping Use   Vaping status: Never Used  Substance and Sexual Activity   Alcohol use: No   Drug use: No   Sexual activity: Never  Other Topics Concern   Not on file  Social History Narrative   Not on file   Social Drivers of Health   Financial Resource Strain: Not on file  Food Insecurity: Not on file  Transportation Needs: Not on file  Physical Activity: Not on file  Stress: Not on file  Social Connections: Not on file  Intimate Partner Violence: Not on file     Constitutional: Denies fever, malaise, fatigue, headache or abrupt weight changes.  HEENT: Denies eye pain, eye redness, ear pain, ringing in the ears, wax buildup, runny nose, nasal congestion, bloody nose, or sore throat. Respiratory: Denies difficulty breathing, shortness of breath, cough or sputum production.   Cardiovascular: Patient reports swelling in legs.  Denies chest pain, chest tightness, palpitations or swelling in the hands.  Gastrointestinal: Denies abdominal pain, bloating, constipation, diarrhea or blood in the stool.  GU: Denies urgency, frequency, pain with urination, burning sensation, blood in urine, odor or discharge. Musculoskeletal: Denies  decrease in range of motion, difficulty with gait, muscle pain or joint pain and swelling.  Skin: Denies redness, rashes, lesions or ulcercations.  Neurological: Denies dizziness, difficulty with memory, difficulty with speech or problems with balance and coordination.  Psych: Denies anxiety, depression, SI/HI.  No other specific complaints in a complete review of systems (except as listed in HPI above).  Objective:   Physical Exam BP 122/70 (BP Location: Left Arm, Patient Position: Sitting, Cuff Size: Normal)   Ht 5\' 11"  (1.803 m)   Wt 193 lb 3.2 oz (87.6 kg)   BMI 26.95 kg/m    Wt Readings from Last 3 Encounters:  10/14/23 183 lb 3.2 oz (83.1 kg)  01/20/23 176 lb 12.8 oz (80.2 kg)  09/09/22 172 lb  (78 kg)    General: Appears her stated age, overweight,  in NAD. Skin: Warm, dry and intact. No rashes noted. HEENT: Head: normal shape and size; Eyes: sclera white, no icterus, conjunctiva pink, PERRLA and EOMs intact;  Neck:  Neck supple, trachea midline. No masses, lumps or thyromegaly present.  Cardiovascular: Normal rate and rhythm. S1,S2 noted.  No murmur, rubs or gallops noted. No JVD. 1+ BLE edema. No carotid bruits noted. Pulmonary/Chest: Normal effort and positive vesicular breath sounds. No respiratory distress. No wheezes, rales or ronchi noted.  Abdomen: Normal bowel sounds.  Musculoskeletal: Strength 5/5 BUE/BLE. No difficulty with gait.  Neurological: Alert and oriented. Cranial nerves II-XII grossly intact. Coordination normal.  Psychiatric: Mood and affect normal. Behavior is normal. Judgment and thought content normal.    BMET    Component Value Date/Time   NA 142 01/20/2023 1012   K 4.7 01/20/2023 1012   CL 104 01/20/2023 1012   CO2 32 01/20/2023 1012   GLUCOSE 97 01/20/2023 1012   BUN 10 01/20/2023 1012   CREATININE 0.98 01/20/2023 1012   CALCIUM 9.9 01/20/2023 1012   GFRNONAA >60 12/05/2021 0305   GFRNONAA 89 06/09/2017 0832   GFRAA >60 04/08/2019 0226   GFRAA 103 06/09/2017 0832    Lipid Panel     Component Value Date/Time   CHOL 205 (H) 01/20/2023 1012   TRIG 55 01/20/2023 1012   HDL 68 01/20/2023 1012   CHOLHDL 3.0 01/20/2023 1012   LDLCALC 122 (H) 01/20/2023 1012    CBC    Component Value Date/Time   WBC 4.7 01/20/2023 1012   RBC 4.86 01/20/2023 1012   HGB 13.8 01/20/2023 1012   HCT 41.6 01/20/2023 1012   PLT 264 01/20/2023 1012   MCV 85.6 01/20/2023 1012   MCH 28.4 01/20/2023 1012   MCHC 33.2 01/20/2023 1012   RDW 14.2 01/20/2023 1012   LYMPHSABS 1,410 06/09/2017 0832   EOSABS 291 06/09/2017 0832   BASOSABS 41 06/09/2017 0832    Hgb A1C Lab Results  Component Value Date   HGBA1C 5.5 01/20/2023            Assessment &  Plan:   Preventative Health Maintenance:  Encouraged her to get a flu shot in the fall Tetanus UTD Encouraged her to get her COVID booster She declines Prevnar 20 today Discussed Shingrix vaccine, she will check coverage with her insurance company and schedule a visit if she would like to have this done Pap smear UTD Mammogram and bone density ordered-she will call to schedule Colon screening UTD She declines CT lung cancer screening Encourage her to consume a balanced diet and exercise regimen Advised her to see an eye doctor and dentist annually We will check  CBC, c-Met, lipid, A1c today  RTC in 6 months, follow-up chronic conditions Helayne Lo, NP

## 2024-02-06 NOTE — Patient Instructions (Signed)
 Health Maintenance for Postmenopausal Women Menopause is a normal process in which your ability to get pregnant comes to an end. This process happens slowly over many months or years, usually between the ages of 24 and 62. Menopause is complete when you have missed your menstrual period for 12 months. It is important to talk with your health care provider about some of the most common conditions that affect women after menopause (postmenopausal women). These include heart disease, cancer, and bone loss (osteoporosis). Adopting a healthy lifestyle and getting preventive care can help to promote your health and wellness. The actions you take can also lower your chances of developing some of these common conditions. What are the signs and symptoms of menopause? During menopause, you may have the following symptoms: Hot flashes. These can be moderate or severe. Night sweats. Decrease in sex drive. Mood swings. Headaches. Tiredness (fatigue). Irritability. Memory problems. Problems falling asleep or staying asleep. Talk with your health care provider about treatment options for your symptoms. Do I need hormone replacement therapy? Hormone replacement therapy is effective in treating symptoms that are caused by menopause, such as hot flashes and night sweats. Hormone replacement carries certain risks, especially as you become older. If you are thinking about using estrogen or estrogen with progestin, discuss the benefits and risks with your health care provider. How can I reduce my risk for heart disease and stroke? The risk of heart disease, heart attack, and stroke increases as you age. One of the causes may be a change in the body's hormones during menopause. This can affect how your body uses dietary fats, triglycerides, and cholesterol. Heart attack and stroke are medical emergencies. There are many things that you can do to help prevent heart disease and stroke. Watch your blood pressure High  blood pressure causes heart disease and increases the risk of stroke. This is more likely to develop in people who have high blood pressure readings or are overweight. Have your blood pressure checked: Every 3-5 years if you are 50-75 years of age. Every year if you are 77 years old or older. Eat a healthy diet  Eat a diet that includes plenty of vegetables, fruits, low-fat dairy products, and lean protein. Do not eat a lot of foods that are high in solid fats, added sugars, or sodium. Get regular exercise Get regular exercise. This is one of the most important things you can do for your health. Most adults should: Try to exercise for at least 150 minutes each week. The exercise should increase your heart rate and make you sweat (moderate-intensity exercise). Try to do strengthening exercises at least twice each week. Do these in addition to the moderate-intensity exercise. Spend less time sitting. Even light physical activity can be beneficial. Other tips Work with your health care provider to achieve or maintain a healthy weight. Do not use any products that contain nicotine or tobacco. These products include cigarettes, chewing tobacco, and vaping devices, such as e-cigarettes. If you need help quitting, ask your health care provider. Know your numbers. Ask your health care provider to check your cholesterol and your blood sugar (glucose). Continue to have your blood tested as directed by your health care provider. Do I need screening for cancer? Depending on your health history and family history, you may need to have cancer screenings at different stages of your life. This may include screening for: Breast cancer. Cervical cancer. Lung cancer. Colorectal cancer. What is my risk for osteoporosis? After menopause, you may be  at increased risk for osteoporosis. Osteoporosis is a condition in which bone destruction happens more quickly than new bone creation. To help prevent osteoporosis or  the bone fractures that can happen because of osteoporosis, you may take the following actions: If you are 61-3 years old, get at least 1,000 mg of calcium and at least 600 international units (IU) of vitamin D per day. If you are older than age 61 but younger than age 75, get at least 1,200 mg of calcium and at least 600 international units (IU) of vitamin D per day. If you are older than age 62, get at least 1,200 mg of calcium and at least 800 international units (IU) of vitamin D per day. Smoking and drinking excessive alcohol increase the risk of osteoporosis. Eat foods that are rich in calcium and vitamin D, and do weight-bearing exercises several times each week as directed by your health care provider. How does menopause affect my mental health? Depression may occur at any age, but it is more common as you become older. Common symptoms of depression include: Feeling depressed. Changes in sleep patterns. Changes in appetite or eating patterns. Feeling an overall lack of motivation or enjoyment of activities that you previously enjoyed. Frequent crying spells. Talk with your health care provider if you think that you are experiencing any of these symptoms. General instructions See your health care provider for regular wellness exams and vaccines. This may include: Scheduling regular health, dental, and eye exams. Getting and maintaining your vaccines. These include: Influenza vaccine. Get this vaccine each year before the flu season begins. Pneumonia vaccine. Shingles vaccine. Tetanus, diphtheria, and pertussis (Tdap) booster vaccine. Your health care provider may also recommend other immunizations. Tell your health care provider if you have ever been abused or do not feel safe at home. Summary Menopause is a normal process in which your ability to get pregnant comes to an end. This condition causes hot flashes, night sweats, decreased interest in sex, mood swings, headaches, or lack  of sleep. Treatment for this condition may include hormone replacement therapy. Take actions to keep yourself healthy, including exercising regularly, eating a healthy diet, watching your weight, and checking your blood pressure and blood sugar levels. Get screened for cancer and depression. Make sure that you are up to date with all your vaccines. This information is not intended to replace advice given to you by your health care provider. Make sure you discuss any questions you have with your health care provider. Document Revised: 02/05/2021 Document Reviewed: 02/05/2021 Elsevier Patient Education  2024 [employer].

## 2024-02-06 NOTE — Assessment & Plan Note (Signed)
 Encouraged diet and exercise for weight loss ?

## 2024-02-07 ENCOUNTER — Encounter: Payer: Self-pay | Admitting: Internal Medicine

## 2024-02-09 LAB — COMPREHENSIVE METABOLIC PANEL WITH GFR
AG Ratio: 1.8 (calc) (ref 1.0–2.5)
ALT: 13 U/L (ref 6–29)
AST: 16 U/L (ref 10–35)
Albumin: 4.4 g/dL (ref 3.6–5.1)
Alkaline phosphatase (APISO): 62 U/L (ref 37–153)
BUN: 11 mg/dL (ref 7–25)
CO2: 30 mmol/L (ref 20–32)
Calcium: 9.5 mg/dL (ref 8.6–10.4)
Chloride: 106 mmol/L (ref 98–110)
Creat: 0.84 mg/dL (ref 0.50–1.05)
Globulin: 2.4 g/dL (ref 1.9–3.7)
Glucose, Bld: 122 mg/dL — ABNORMAL HIGH (ref 65–99)
Potassium: 3.9 mmol/L (ref 3.5–5.3)
Sodium: 140 mmol/L (ref 135–146)
Total Bilirubin: 0.7 mg/dL (ref 0.2–1.2)
Total Protein: 6.8 g/dL (ref 6.1–8.1)
eGFR: 78 mL/min/{1.73_m2} (ref >=60)

## 2024-02-09 LAB — CBC
HCT: 36.9 % (ref 35.0–45.0)
Hemoglobin: 12.2 g/dL (ref 11.7–15.5)
MCH: 28.2 pg (ref 27.0–33.0)
MCHC: 33.1 g/dL (ref 32.0–36.0)
MCV: 85.4 fL (ref 80.0–100.0)
MPV: 10.5 fL (ref 7.5–12.5)
Platelets: 246 10*3/uL (ref 140–400)
RBC: 4.32 10*6/uL (ref 3.80–5.10)
RDW: 14.2 % (ref 11.0–15.0)
WBC: 5 10*3/uL (ref 3.8–10.8)

## 2024-02-09 LAB — LIPID PANEL
Cholesterol: 187 mg/dL (ref <200)
HDL: 62 mg/dL (ref >=50)
LDL Cholesterol (Calc): 111 mg/dL — ABNORMAL HIGH
Non-HDL Cholesterol (Calc): 125 mg/dL (ref <130)
Total CHOL/HDL Ratio: 3 (calc) (ref <5.0)
Triglycerides: 47 mg/dL (ref <150)

## 2024-02-09 LAB — HEMOGLOBIN A1C
Hgb A1c MFr Bld: 5.5 % (ref <5.7)
Mean Plasma Glucose: 111 mg/dL
eAG (mmol/L): 6.2 mmol/L

## 2024-04-13 ENCOUNTER — Emergency Department: Payer: PRIVATE HEALTH INSURANCE

## 2024-04-13 ENCOUNTER — Other Ambulatory Visit: Payer: Self-pay

## 2024-04-13 ENCOUNTER — Inpatient Hospital Stay
Admission: EM | Admit: 2024-04-13 | Discharge: 2024-04-15 | DRG: 872 | Disposition: A | Discharge status: Home / Self Care | Payer: PRIVATE HEALTH INSURANCE | Attending: Internal Medicine | Admitting: Internal Medicine

## 2024-04-13 ENCOUNTER — Ambulatory Visit: Payer: Self-pay

## 2024-04-13 DIAGNOSIS — Z79899 Other long term (current) drug therapy: Secondary | ICD-10-CM

## 2024-04-13 DIAGNOSIS — E785 Hyperlipidemia, unspecified: Secondary | ICD-10-CM | POA: Diagnosis present

## 2024-04-13 DIAGNOSIS — Z8249 Family history of ischemic heart disease and other diseases of the circulatory system: Secondary | ICD-10-CM

## 2024-04-13 DIAGNOSIS — F17210 Nicotine dependence, cigarettes, uncomplicated: Secondary | ICD-10-CM | POA: Diagnosis present

## 2024-04-13 DIAGNOSIS — A419 Sepsis, unspecified organism: Principal | ICD-10-CM | POA: Diagnosis present

## 2024-04-13 DIAGNOSIS — L03115 Cellulitis of right lower limb: Secondary | ICD-10-CM | POA: Diagnosis not present

## 2024-04-13 DIAGNOSIS — I890 Lymphedema, not elsewhere classified: Secondary | ICD-10-CM | POA: Diagnosis present

## 2024-04-13 DIAGNOSIS — L0390 Cellulitis, unspecified: Secondary | ICD-10-CM

## 2024-04-13 DIAGNOSIS — I10 Essential (primary) hypertension: Secondary | ICD-10-CM | POA: Diagnosis present

## 2024-04-13 LAB — HIV ANTIBODY (ROUTINE TESTING W REFLEX): HIV Screen 4th Generation wRfx: NONREACTIVE

## 2024-04-13 LAB — COMPREHENSIVE METABOLIC PANEL WITH GFR
ALT: 16 U/L (ref 0–44)
AST: 23 U/L (ref 15–41)
Albumin: 4.4 g/dL (ref 3.5–5.0)
Alkaline Phosphatase: 78 U/L (ref 38–126)
Anion gap: 12 (ref 5–15)
BUN: 11 mg/dL (ref 8–23)
CO2: 24 mmol/L (ref 22–32)
Calcium: 9.7 mg/dL (ref 8.9–10.3)
Chloride: 104 mmol/L (ref 98–111)
Creatinine, Ser: 0.85 mg/dL (ref 0.44–1.00)
GFR, Estimated: >60 mL/min (ref >60)
Glucose, Bld: 124 mg/dL — ABNORMAL HIGH (ref 70–99)
Potassium: 3.4 mmol/L — ABNORMAL LOW (ref 3.5–5.1)
Sodium: 140 mmol/L (ref 135–145)
Total Bilirubin: 1 mg/dL (ref 0.0–1.2)
Total Protein: 8.1 g/dL (ref 6.5–8.1)

## 2024-04-13 LAB — CBC WITH DIFFERENTIAL/PLATELET
Abs Immature Granulocytes: 0.12 K/uL — ABNORMAL HIGH (ref 0.00–0.07)
Basophils Absolute: 0 K/uL (ref 0.0–0.1)
Basophils Relative: 0 %
Eosinophils Absolute: 0 K/uL (ref 0.0–0.5)
Eosinophils Relative: 0 %
HCT: 41.4 % (ref 36.0–46.0)
Hemoglobin: 13.4 g/dL (ref 12.0–15.0)
Immature Granulocytes: 1 %
Lymphocytes Relative: 3 %
Lymphs Abs: 0.5 K/uL — ABNORMAL LOW (ref 0.7–4.0)
MCH: 28.2 pg (ref 26.0–34.0)
MCHC: 32.4 g/dL (ref 30.0–36.0)
MCV: 87 fL (ref 80.0–100.0)
Monocytes Absolute: 0.5 K/uL (ref 0.1–1.0)
Monocytes Relative: 3 %
Neutro Abs: 15.3 K/uL — ABNORMAL HIGH (ref 1.7–7.7)
Neutrophils Relative %: 93 %
Platelets: 278 K/uL (ref 150–400)
RBC: 4.76 MIL/uL (ref 3.87–5.11)
RDW: 13.7 % (ref 11.5–15.5)
WBC: 16.4 K/uL — ABNORMAL HIGH (ref 4.0–10.5)
nRBC: 0 % (ref 0.0–0.2)

## 2024-04-13 LAB — URINALYSIS, W/ REFLEX TO CULTURE (INFECTION SUSPECTED)
Bacteria, UA: NONE SEEN
Bilirubin Urine: NEGATIVE
Glucose, UA: NEGATIVE mg/dL
Hgb urine dipstick: NEGATIVE
Ketones, ur: NEGATIVE mg/dL
Leukocytes,Ua: NEGATIVE
Nitrite: NEGATIVE
Protein, ur: NEGATIVE mg/dL
Specific Gravity, Urine: 1.014 (ref 1.005–1.030)
pH: 6 (ref 5.0–8.0)

## 2024-04-13 LAB — LACTIC ACID, PLASMA: Lactic Acid, Venous: 1 mmol/L (ref 0.5–1.9)

## 2024-04-13 LAB — PROTIME-INR
INR: 1.1 (ref 0.8–1.2)
Prothrombin Time: 15 s (ref 11.4–15.2)

## 2024-04-13 LAB — CBC
HCT: 36 % (ref 36.0–46.0)
Hemoglobin: 12.2 g/dL (ref 12.0–15.0)
MCH: 29.2 pg (ref 26.0–34.0)
MCHC: 33.9 g/dL (ref 30.0–36.0)
MCV: 86.1 fL (ref 80.0–100.0)
Platelets: 222 K/uL (ref 150–400)
RBC: 4.18 MIL/uL (ref 3.87–5.11)
RDW: 13.6 % (ref 11.5–15.5)
WBC: 16 K/uL — ABNORMAL HIGH (ref 4.0–10.5)
nRBC: 0 % (ref 0.0–0.2)

## 2024-04-13 LAB — CREATININE, SERUM
Creatinine, Ser: 0.71 mg/dL (ref 0.44–1.00)
GFR, Estimated: >60 mL/min (ref >60)

## 2024-04-13 MED ORDER — ONDANSETRON HCL 4 MG PO TABS: 4.0000 mg | ORAL_TABLET | Freq: Four times a day (QID) | ORAL | Status: DC | PRN

## 2024-04-13 MED ORDER — ACETAMINOPHEN 325 MG PO TABS
650.0000 mg | ORAL_TABLET | Freq: Four times a day (QID) | ORAL | Status: DC | PRN
  Administered 2024-04-14: 650 mg via ORAL
  Filled 2024-04-13: qty 2

## 2024-04-13 MED ORDER — ENOXAPARIN SODIUM 40 MG/0.4ML IJ SOSY
40.0000 mg | PREFILLED_SYRINGE | INTRAMUSCULAR | Status: DC
  Administered 2024-04-13 – 2024-04-14 (×2): 40 mg via SUBCUTANEOUS
  Filled 2024-04-13: qty 0.4

## 2024-04-13 MED ORDER — VANCOMYCIN HCL IN DEXTROSE 1-5 GM/200ML-% IV SOLN
1000.0000 mg | Freq: Once | INTRAVENOUS | Status: AC
  Administered 2024-04-13: 1000 mg via INTRAVENOUS
  Filled 2024-04-13: qty 200

## 2024-04-13 MED ORDER — CEFAZOLIN SODIUM-DEXTROSE 2-4 GM/100ML-% IV SOLN
2.0000 g | Freq: Three times a day (TID) | INTRAVENOUS | Status: DC
  Administered 2024-04-14 – 2024-04-15 (×4): 2 g via INTRAVENOUS
  Filled 2024-04-13 (×4): qty 100

## 2024-04-13 MED ORDER — FUROSEMIDE 10 MG/ML IJ SOLN
40.0000 mg | Freq: Once | INTRAMUSCULAR | Status: AC
  Administered 2024-04-13: 40 mg via INTRAVENOUS
  Filled 2024-04-13: qty 4

## 2024-04-13 MED ORDER — SODIUM CHLORIDE 0.9 % IV BOLUS
500.0000 mL | Freq: Once | INTRAVENOUS | Status: AC
  Administered 2024-04-13: 500 mL via INTRAVENOUS

## 2024-04-13 MED ORDER — ONDANSETRON HCL 4 MG/2ML IJ SOLN: 4.0000 mg | Freq: Four times a day (QID) | INTRAMUSCULAR | Status: DC | PRN

## 2024-04-13 MED ORDER — POTASSIUM CHLORIDE 20 MEQ PO PACK
40.0000 meq | PACK | Freq: Once | ORAL | Status: AC
  Administered 2024-04-13: 40 meq via ORAL
  Filled 2024-04-13: qty 2

## 2024-04-13 MED ORDER — ALBUTEROL SULFATE (2.5 MG/3ML) 0.083% IN NEBU: 3.0000 mL | INHALATION_SOLUTION | Freq: Four times a day (QID) | RESPIRATORY_TRACT | Status: DC | PRN

## 2024-04-13 MED ORDER — ACETAMINOPHEN 500 MG PO TABS
1000.0000 mg | ORAL_TABLET | Freq: Once | ORAL | Status: AC
  Administered 2024-04-13: 1000 mg via ORAL
  Filled 2024-04-13: qty 2

## 2024-04-13 MED ORDER — SODIUM CHLORIDE 0.9 % IV SOLN
2.0000 g | Freq: Once | INTRAVENOUS | Status: AC
  Administered 2024-04-13: 2 g via INTRAVENOUS
  Filled 2024-04-13: qty 20

## 2024-04-13 MED ORDER — ACETAMINOPHEN 650 MG RE SUPP: 650.0000 mg | Freq: Four times a day (QID) | RECTAL | Status: DC | PRN

## 2024-04-13 NOTE — ED Notes (Signed)
 US  put pt back in lobby. This tech brought pt to room.

## 2024-04-13 NOTE — ED Triage Notes (Addendum)
 Pt comes with right leg swelling since Sunday. Pt has hx of lymphedema. Pt states painful and worse when standing.   Pt denies any cp or sob.

## 2024-04-13 NOTE — ED Provider Notes (Signed)
 [facility] Provider Note    Event Date/Time   First MD Initiated Contact with Patient 04/13/24 1548     (approximate)   History   Leg Swelling  Pt comes with right leg swelling since Sunday. Pt has hx of lymphedema. Pt states painful and worse when standing.   Pt denies any cp or sob.   HPI Amber Figueroa is a 63 y.o. female PMH lymphedema, anemia, hypertension, hyperlipidemia presents for evaluation of right leg pain, swelling - Patient states both of her legs intermittently get swollen though nights her right foot has become very tender and painful today.  Notes it is red as well.  Has not noticed any frank fevers but was experiencing some chills.  No preceding trauma.  Says she has had infections on this leg before.     Physical Exam   Triage Vital Signs: ED Triage Vitals [04/13/24 1355]  Encounter Vitals Group     BP (!) 151/95     Girls Systolic BP Percentile      Girls Diastolic BP Percentile      Boys Systolic BP Percentile      Boys Diastolic BP Percentile      Pulse Rate (!) 108     Resp 18     Temp 99.7 F (37.6 C)     Temp src      SpO2 100 %     Weight      Height      Head Circumference      Peak Flow      Pain Score 5     Pain Loc      Pain Education      Exclude from Growth Chart     Most recent vital signs: Vitals:   04/13/24 1805 04/13/24 1900  BP: 131/68 129/64  Pulse: 95 90  Resp: 15 16  Temp:    SpO2: 99% 100%   On recheck, temperature 102 Fahrenheit.  General: Awake, no distress.  CV:  Good peripheral perfusion.  Mild tachycardia, regular rhythm, RP 2+ Resp:  Normal effort. CTAB Abd:  No distention. Nontender to deep palpation throughout Other:  Right lower extremity erythematous throughout foot with mild edema to dorsum of foot, erythema extends up through proximal shin.  No streaking beyond this, no no crepitus, no necrotic tissue.   ED Results / Procedures / Treatments   Labs (all labs ordered are  listed, but only abnormal results are displayed) Labs Reviewed  CBC WITH DIFFERENTIAL/PLATELET - Abnormal; Notable for the following components:      Result Value   WBC 16.4 (*)    Neutro Abs 15.3 (*)    Lymphs Abs 0.5 (*)    Abs Immature Granulocytes 0.12 (*)    All other components within normal limits  COMPREHENSIVE METABOLIC PANEL WITH GFR - Abnormal; Notable for the following components:   Potassium 3.4 (*)    Glucose, Bld 124 (*)    All other components within normal limits  CULTURE, BLOOD (ROUTINE X 2)  CULTURE, BLOOD (ROUTINE X 2)  LACTIC ACID, PLASMA  PROTIME-INR  LACTIC ACID, PLASMA  URINALYSIS, W/ REFLEX TO CULTURE (INFECTION SUSPECTED)     EKG  Ecg = sinus rhythm, rate 92, no gross ST elevation or depression, no significant repolarization abnormality, normal axis, normal intervals.  No evidence of ischemia nor arrhythmia interpretation.   RADIOLOGY Radiology interpreted by myself and radiology reports reviewed.  No acute pathology identified.    PROCEDURES:  Critical Care performed: Yes, see critical care procedure note(s)  Procedures   MEDICATIONS ORDERED IN ED: Medications  vancomycin  (VANCOCIN ) IVPB 1000 mg/200 mL premix (1,000 mg Intravenous New Bag/Given 04/13/24 1856)  acetaminophen  (TYLENOL ) tablet 1,000 mg (1,000 mg Oral Given 04/13/24 1808)  sodium chloride  0.9 % bolus 500 mL (0 mLs Intravenous Stopped 04/13/24 1858)  cefTRIAXone  (ROCEPHIN ) 2 g in sodium chloride  0.9 % 100 mL IVPB (0 g Intravenous Stopped 04/13/24 1858)     IMPRESSION / MDM / ASSESSMENT AND PLAN / ED COURSE  I reviewed the triage vital signs and the nursing notes.                              DDX/MDM/AP: Differential diagnosis includes, but is not limited to, high concern for right lower extremity cellulitis, no findings particularly suggestive of necrotizing fasciitis however.  Consider possibility of underlying osteomyelitis.  Doubt underlying abscess.  Considered but doubt  underlying DVT, DVT study already appropriately completed from triage orders.  Patient is febrile now, concern for possible sepsis.  No hypotension, will give reduced modified fluid bolus of 500 cc, not meeting septic shock criteria.  Plan for broad-spectrum antibiotics, anticipate admission.  Plan: - Labs - X-ray right foot - Cardiac monitor - Ceftriaxone , vancomycin  - Antipyretic - Anticipate admission  Patient's presentation is most consistent with acute presentation with potential threat to life or bodily function.  The patient is on the cardiac monitor to evaluate for evidence of arrhythmia and/or significant heart rate changes.  ED course below.  Labs with leukocytosis and left shift, lactate normal, not meeting septic shock criteria, will defer further IV fluid.  Treated with ceftriaxone  and vancomycin .  No evidence of underlying osteomyelitis.  Admitted to hospitalist service for further management.  Clinical Course as of 04/13/24 1912  Tue Apr 13, 2024  1837 CBC with leukocytosis to 16 CMP reviewed, overall unremarkable beyond mild hyponatremia  Lactate normal [MM]  1837 XR reviewed, no obvious pathology on my interpretation [MM]  1838 DVT study reviewed, no obvious DVT on my interpretation, formal radiology read pending [MM]  1839 Hospitalist consult order placed [MM]    Clinical Course User Index [MM] Clarine Ozell LABOR, MD     FINAL CLINICAL IMPRESSION(S) / ED DIAGNOSES   Final diagnoses:  Cellulitis of right leg  Sepsis, due to unspecified organism, unspecified whether acute organ dysfunction present [facility])     Rx / DC Orders   ED Discharge Orders     None        Note:  This document was prepared using Dragon voice recognition software and may include unintentional dictation errors.   Clarine Ozell LABOR, MD 04/13/24 [phone]

## 2024-04-13 NOTE — H&P (Signed)
 History and Physical    Patient: Amber Figueroa FMW:[id number] DOB: 05/25/1961 DOA: 04/13/2024 DOS: the patient was seen and examined on 04/13/2024 PCP: Antonette Angeline ORN, NP  Patient coming from: Home  Chief Complaint:  Chief Complaint  Patient presents with   Leg Swelling   HPI: Amber Figueroa is a 63 y.o. female with medical history significant of hypertension, hyperlipidemia, lymphedema, presents to the emergency department for evaluation of right lower extremity pain and redness that started earlier today.  The erythema extends from the dorsum of the foot to about the mid calf.  Pain is worst in the midfoot area, and exacerbated with weightbearing.  She denies trauma/injury, bug bites, or any inciting event.  She has associated chills but denies all other systemic symptoms.  She reports her lower extremity swelling is actually somewhat improved from baseline. No numbness or tingling of the foot.   In the emergency department, she is febrile with a Tmax of 102, tachycardic, with a leukocytosis.  She is nontoxic appearing and normotensive with reassuring lactic acid.  X-ray of the right foot without evidence of osteomyelitis, but there was diffuse subcutaneous soft tissue edema noted.  Ultrasound of the right lower extremity is pending to rule DVT.  Of note, she had a nearly identical presentation about 2.5 years ago, this is her first recurrence since then.   Will admit to the hospitalist service for further management of sepsis secondary to cellulitis  Review of Systems: Review of Systems  Constitutional:  Positive for chills and fever. Negative for malaise/fatigue and weight loss.  Respiratory:  Negative for cough, sputum production and shortness of breath.   Cardiovascular:  Positive for leg swelling. Negative for chest pain and palpitations.  Gastrointestinal:  Negative for abdominal pain, constipation, diarrhea, nausea and vomiting.  Genitourinary:  Negative for dysuria, frequency and  hematuria.  Musculoskeletal:  Negative for falls.  Skin:  Negative for itching.       Cellulitis   Neurological:  Negative for tingling and focal weakness.  Endo/Heme/Allergies:  Does not bruise/bleed easily.  Psychiatric/Behavioral:  Negative for substance abuse and suicidal ideas. The patient is not nervous/anxious.     Past Medical History:  Diagnosis Date   Back pain    Hypertension    Past Surgical History:  Procedure Laterality Date   COLONOSCOPY WITH PROPOFOL  N/A 07/21/2017   Procedure: COLONOSCOPY WITH PROPOFOL ;  Surgeon: Therisa Bi, MD;  [facility] END[facility]COPY;  Service: Gastroenterology;  Laterality: N/A;   KNEE SURGERY     Social History:  reports that she has been smoking cigarettes. She has a 1 pack-year smoking history. She has never used smokeless tobacco. She reports that she does not drink alcohol and does not use drugs.  No Known Allergies  Family History  Problem Relation Age of Onset   Cancer Mother    Cancer Father        unknown type   Hypertension Sister    Hypertension Brother    Hyperlipidemia Brother    Breast cancer Maternal Grandmother        likely breast ca - mastectomy   Hypertension Brother    Diabetes Brother    Hypertension Brother    Heart attack Neg Hx    Stroke Neg Hx    Colon cancer Neg Hx    Ovarian cancer Neg Hx     Prior to Admission medications   Medication Sig Start Date End Date Taking? Authorizing Provider  albuterol  (VENTOLIN  HFA) 108 (90 Base) MCG/ACT inhaler  Inhale 2 puffs into the lungs every 6 (six) hours as needed for wheezing or shortness of breath. 06/18/22   Gladis Elsie BROCKS, PA-C  amLODipine  (NORVASC ) 10 MG tablet Take 1 tablet (10 mg total) by mouth daily. 10/14/23   Antonette Angeline ORN, NP    Physical Exam: Vitals:   04/13/24 1355 04/13/24 1751 04/13/24 1805 04/13/24 1900  BP: (!) 151/95  131/68 129/64  Pulse: (!) 108  95 90  Resp: 18  15 16   Temp: 99.7 F (37.6 C) (!) 102 F (38.9 C)    TempSrc:  Oral     SpO2: 100%  99% 100%   Physical Exam Vitals and nursing note reviewed.  Constitutional:      General: She is not in acute distress.    Appearance: Normal appearance. She is not toxic-appearing.  HENT:     Head: Normocephalic and atraumatic.  Eyes:     Extraocular Movements: Extraocular movements intact.     Pupils: Pupils are equal, round, and reactive to light.  Cardiovascular:     Rate and Rhythm: Normal rate and regular rhythm.  Pulmonary:     Effort: Pulmonary effort is normal. No respiratory distress.     Breath sounds: Normal breath sounds. No wheezing.  Abdominal:     General: There is no distension.     Palpations: Abdomen is soft.     Tenderness: There is no abdominal tenderness.  Musculoskeletal:     Cervical back: Normal range of motion and neck supple.     Comments: Bilateral lower extremity edema right greater than left Right pedal edema with erythema of the  foot through the mid calf.  No drainage or wounds. Foot warm to touch.  Ankle ROM intact   Skin:    General: Skin is warm and dry.     Capillary Refill: Capillary refill takes less than 2 seconds.     Findings: Erythema present.  Neurological:     Mental Status: She is alert and oriented to person, place, and time. Mental status is at baseline.  Psychiatric:        Mood and Affect: Mood normal.        Behavior: Behavior normal.     Data Reviewed:    Labs on Admission: I have personally reviewed following labs and imaging studies  CBC: Recent Labs  Lab 04/13/24 1756 04/13/24 1958  WBC 16.4* 16.0*  NEUTROABS 15.3*  --   HGB 13.4 12.2  HCT 41.4 36.0  MCV 87.0 86.1  PLT 278 222   Basic Metabolic Panel: Recent Labs  Lab 04/13/24 1756 04/13/24 1958  NA 140  --   K 3.4*  --   CL 104  --   CO2 24  --   GLUCOSE 124*  --   BUN 11  --   CREATININE 0.85 0.71  CALCIUM 9.7  --    GFR: CrCl cannot be calculated (Unknown ideal weight.). Liver Function Tests: Recent Labs  Lab 04/13/24 1756   AST 23  ALT 16  ALKPHOS 78  BILITOT 1.0  PROT 8.1  ALBUMIN 4.4   No results for input(s): LIPASE, AMYLASE in the last 168 hours. No results for input(s): AMMONIA in the last 168 hours. Coagulation Profile: Recent Labs  Lab 04/13/24 1756  INR 1.1   Cardiac Enzymes: No results for input(s): CKTOTAL, CKMB, CKMBINDEX, TROPONINI in the last 168 hours. BNP (last 3 results) No results for input(s): PROBNP in the last 8760 hours. HbA1C: No results for  input(s): HGBA1C in the last 72 hours. CBG: No results for input(s): GLUCAP in the last 168 hours. Lipid Profile: No results for input(s): CHOL, HDL, LDLCALC, TRIG, CHOLHDL, LDLDIRECT in the last 72 hours. Thyroid Function Tests: No results for input(s): TSH, T4TOTAL, FREET4, T3FREE, THYROIDAB in the last 72 hours. Anemia Panel: No results for input(s): VITAMINB12, FOLATE, FERRITIN, TIBC, IRON, RETICCTPCT in the last 72 hours. Urine analysis:    Component Value Date/Time   COLORURINE YELLOW (A) 04/13/2024 1757   APPEARANCEUR CLEAR (A) 04/13/2024 1757   LABSPEC 1.014 04/13/2024 1757   PHURINE 6.0 04/13/2024 1757   GLUCOSEU NEGATIVE 04/13/2024 1757   HGBUR NEGATIVE 04/13/2024 1757   BILIRUBINUR NEGATIVE 04/13/2024 1757   KETONESUR NEGATIVE 04/13/2024 1757   PROTEINUR NEGATIVE 04/13/2024 1757   NITRITE NEGATIVE 04/13/2024 1757   LEUKOCYTESUR NEGATIVE 04/13/2024 1757    Radiological Exams on Admission: US  Venous Img Lower Unilateral Right Result Date: 04/13/2024 CLINICAL DATA:  Swelling EXAM: RIGHT LOWER EXTREMITY VENOUS DOPPLER ULTRASOUND TECHNIQUE: Gray-scale sonography with compression, as well as color and duplex ultrasound, were performed to evaluate the deep venous system(s) from the level of the common femoral vein through the popliteal and proximal calf veins. COMPARISON:  None Available. FINDINGS: VENOUS Normal compressibility of the common femoral, superficial femoral,  and popliteal veins, as well as the visualized calf veins. Visualized portions of profunda femoral vein and great saphenous vein unremarkable. No filling defects to suggest DVT on grayscale or color Doppler imaging. Doppler waveforms show normal direction of venous flow, normal respiratory plasticity and response to augmentation. Limited views of the contralateral common femoral vein are unremarkable. OTHER None. Limitations: none IMPRESSION: Negative. Electronically Signed   By: Greig Pique M.D.   On: 04/13/2024 19:23   DG Foot Complete Right Result Date: 04/13/2024 CLINICAL DATA:  Right leg cellulitis, eval for underlying osteomyelitis EXAM: RIGHT FOOT COMPLETE - 3+ VIEW COMPARISON:  None Available. FINDINGS: No cortical erosion or destruction. There is no evidence of fracture or dislocation. There is no evidence of arthropathy or other focal bone abnormality. Diffuse ankle and dorsum of the foot subcutaneus soft tissue edema. IMPRESSION: 1. No radiographic findings to suggest osteomyelitis. 2.  No acute displaced fracture or dislocation. 3. Diffuse subcutaneus soft tissue edema. Electronically Signed   By: Morgane  Naveau M.D.   On: 04/13/2024 18:43       Assessment and Plan: No notes have been filed under this hospital service. Service: Hospitalist  63 y.o. female with medical history significant of hypertension, hyperlipidemia, lymphedema  admitted for management of sepsis secondary to acute nonpurulent cellulitis of the right lower extremity    Sepsis secondary to non-purulent cellulitis of the Right lower extremity  in the setting of Chronic lymphedema - Meeting sepsis criteria on arrival with fever (Tmax 102), tachycardia, leukocytosis, and cellulitis of the right foot without evidence of underlying osteomyelitis on x-ray.  Otherwise hemodynamically stable with reassuring lactic acid - Follow-up lower extremity Doppler - trial 1x dose lasix  tonight  -Hold compression/TED hose until  infection resolves - no IVF given HD stable, significant edema - Blood cultures pending - s/p Vanc and Rocephin  in the emergency department, proceed with IV ancef . Responded well to this previously.   Hypertension -Normotensive currently, hold home amlodipine  for now. resume as indicated  Monitor/replace electrolytes Lovenox  Regular diet  No IVF    Advance Care Planning:   Code Status: Full Code discussed with patient at time of admission   Severity of Illness: The appropriate  patient status for this patient is OBSERVATION. Observation status is judged to be reasonable and necessary in order to provide the required intensity of service to ensure the patient's safety. The patient's presenting symptoms, physical exam findings, and initial radiographic and laboratory data in the context of their medical condition is felt to place them at decreased risk for further clinical deterioration. Furthermore, it is anticipated that the patient will be medically stable for discharge from the hospital within 2 midnights of admission.   Author: Daved JAYSON Pump, DO 04/13/2024 8:24 PM  For on call review [email or web address].

## 2024-04-13 NOTE — ED Notes (Signed)
 EKG given to Clarine, MD

## 2024-04-13 NOTE — Consult Note (Signed)
 PHARMACY - BRIEF ANTIBIOTIC NOTE   Pharmacy has received consult(s) for vancomycin  from an ED provider. The patient's profile has been reviewed for ht/wt/allergies/indication/available labs.    One time order(s) placed for: vancomycin  1000mg  IV Height / weight on file too old -- new orders placed  Further antibiotics/pharmacy consults should be ordered by admitting physician if indicated.                   Thank you,  Will M. Lenon, PharmD Clinical Pharmacist 04/13/2024 6:00 PM

## 2024-04-13 NOTE — ED Notes (Addendum)
 Patient transported to Ultrasound. US  asked to bring pt to room once finished with scan.

## 2024-04-13 NOTE — Telephone Encounter (Signed)
 FYI Only or Action Required?: Action required by provider: request for appointment.  Patient was last seen in primary care on 02/06/2024 by Antonette Angeline ORN, NP.  Called Nurse Triage reporting Leg Swelling.  Symptoms began Sunday. Interventions attempted: Other: compression hose.  Symptoms are: gradually worsening.  Triage Disposition: See Physician Within 24 Hours  Patient/caregiver understands and will follow disposition?: yes Pt wanting appt today. Advised [place] to [facility] if worsening. APpt added to wait list.               Copied from CRM [id number]. Topic: Clinical - Red Word Triage >> Apr 13, 2024 11:16 AM Powell HERO wrote: Red Word that prompted transfer to Nurse Triage: Leg swells up and gets heat on it right leg. Hurts to walk on it. Intermittently happening and hasn't happened in two years since she was treated for it lymphedema. Worried its back. Very uncomfortable. Reason for Disposition  [1] MODERATE leg swelling (e.g., swelling extends up to knees) AND [2] new-onset or getting worse  Answer Assessment - Initial Assessment Questions 1. ONSET: When did the swelling start? (e.g., minutes, hours, days)     Top of foot aching Sunday 2. LOCATION: What part of the leg is swollen?  Are both legs swollen or just one leg?     Foot to almost to knee cap 3. SEVERITY: How bad is the swelling? (e.g., localized; mild, moderate, severe) mod 4. REDNESS: Is there redness or signs of infection?     yes 5. PAIN: Is the swelling painful to touch? If Yes, ask: How painful is it?   (Scale 1-10; mild, moderate or severe)     severe 6. FEVER: Do you have a fever? If Yes, ask: What is it, how was it measured, and when did it start?      no 7. CAUSE: What do you think is causing the leg swelling?     lymphedema 8. MEDICAL HISTORY: Do you have a history of blood clots (e.g., DVT), cancer, heart failure, kidney disease, or liver failure?     no 9. RECURRENT SYMPTOM:  Have you had leg swelling before? If Yes, ask: When was the last time? What happened that time?     2 years ago same issue, leg is red   10. OTHER SYMPTOMS: Do you have any other symptoms? (e.g., chest pain, difficulty breathing)       fatigue  Protocols used: Leg Swelling and Edema-A-AH

## 2024-04-13 NOTE — Telephone Encounter (Signed)
Will discuss at upcoming appointment tomorrow

## 2024-04-13 NOTE — ED Notes (Signed)
 Called CCMD for central monitoring at this time

## 2024-04-14 ENCOUNTER — Encounter: Payer: Self-pay | Admitting: Emergency Medicine

## 2024-04-14 ENCOUNTER — Ambulatory Visit: Payer: PRIVATE HEALTH INSURANCE | Admitting: Internal Medicine

## 2024-04-14 DIAGNOSIS — E785 Hyperlipidemia, unspecified: Secondary | ICD-10-CM | POA: Diagnosis present

## 2024-04-14 DIAGNOSIS — L0390 Cellulitis, unspecified: Secondary | ICD-10-CM | POA: Diagnosis not present

## 2024-04-14 DIAGNOSIS — L03115 Cellulitis of right lower limb: Secondary | ICD-10-CM | POA: Diagnosis present

## 2024-04-14 DIAGNOSIS — Z79899 Other long term (current) drug therapy: Secondary | ICD-10-CM | POA: Diagnosis not present

## 2024-04-14 DIAGNOSIS — I890 Lymphedema, not elsewhere classified: Secondary | ICD-10-CM | POA: Diagnosis present

## 2024-04-14 DIAGNOSIS — F17210 Nicotine dependence, cigarettes, uncomplicated: Secondary | ICD-10-CM | POA: Diagnosis present

## 2024-04-14 DIAGNOSIS — A419 Sepsis, unspecified organism: Secondary | ICD-10-CM | POA: Diagnosis present

## 2024-04-14 DIAGNOSIS — I10 Essential (primary) hypertension: Secondary | ICD-10-CM | POA: Diagnosis present

## 2024-04-14 DIAGNOSIS — Z8249 Family history of ischemic heart disease and other diseases of the circulatory system: Secondary | ICD-10-CM | POA: Diagnosis not present

## 2024-04-14 LAB — BASIC METABOLIC PANEL WITH GFR
Anion gap: 11 (ref 5–15)
BUN: 9 mg/dL (ref 8–23)
CO2: 27 mmol/L (ref 22–32)
Calcium: 8.8 mg/dL — ABNORMAL LOW (ref 8.9–10.3)
Chloride: 103 mmol/L (ref 98–111)
Creatinine, Ser: 0.91 mg/dL (ref 0.44–1.00)
GFR, Estimated: >60 mL/min (ref >60)
Glucose, Bld: 112 mg/dL — ABNORMAL HIGH (ref 70–99)
Potassium: 3.6 mmol/L (ref 3.5–5.1)
Sodium: 141 mmol/L (ref 135–145)

## 2024-04-14 LAB — CBC
HCT: 35.9 % — ABNORMAL LOW (ref 36.0–46.0)
Hemoglobin: 12 g/dL (ref 12.0–15.0)
MCH: 28.8 pg (ref 26.0–34.0)
MCHC: 33.4 g/dL (ref 30.0–36.0)
MCV: 86.1 fL (ref 80.0–100.0)
Platelets: 241 K/uL (ref 150–400)
RBC: 4.17 MIL/uL (ref 3.87–5.11)
RDW: 14 % (ref 11.5–15.5)
WBC: 14.2 K/uL — ABNORMAL HIGH (ref 4.0–10.5)
nRBC: 0 % (ref 0.0–0.2)

## 2024-04-14 LAB — LACTIC ACID, PLASMA: Lactic Acid, Venous: 0.7 mmol/L (ref 0.5–1.9)

## 2024-04-14 MED ORDER — FUROSEMIDE 10 MG/ML IJ SOLN
40.0000 mg | Freq: Once | INTRAMUSCULAR | Status: AC
  Administered 2024-04-14: 40 mg via INTRAVENOUS
  Filled 2024-04-14: qty 4

## 2024-04-14 NOTE — Plan of Care (Signed)
  Problem: Education: Goal: Knowledge of General Education information will improve Description: Including pain rating scale, medication(s)/side effects and non-pharmacologic comfort measures Outcome: Progressing   Problem: Health Behavior/Discharge Planning: Goal: Ability to manage health-related needs will improve Outcome: Progressing   Problem: Clinical Measurements: Goal: Ability to maintain clinical measurements within normal limits will improve Outcome: Progressing   Problem: Elimination: Goal: Will not experience complications related to bowel motility Outcome: Progressing   Problem: Clinical Measurements: Goal: Ability to avoid or minimize complications of infection will improve Outcome: Progressing

## 2024-04-14 NOTE — Hospital Course (Addendum)
 62 yof w/ HTN, HLD, lymphedema presented to the ED for evaluation of right lower extremity pain and redness that started earlier on 04/13/24.The erythema extends from the dorsum of the foot to about the mid calf. Pain is worst in the midfoot area, and exacerbated with weightbearing no trauma/injury, bug bites In the ZI:qzampoz with a Tmax of 102, tachycardic, with a leukocytosis. X-ray Rt foot>no evidence of osteomyelitis, was diffuse subcutaneous soft tissue edema noted. US  RLE> Neg for DVT. Similar previous hospitalization 2.5 years ago Patient was placed on Ancef  and also given Lasix  and admitted  Overall doing well rle is less red and tender and swelling improved altho hs lymphodema and at baselin She feels ready to go home today   Subjective: Seen and examined Having meal With feeling better right leg still red and swollen more than left Overnight temperature in 98.9 on admission 102, BP stable on room air Labs reviewed with stable BMP CBC with improving leukocytosis.  Discharge diagnosis   Sepsis POA  due to non-purulent cellulitis of the RLE in the setting of Chronic lymphedema: Patient with fever, leukocytosis but imaging did not show evidence of osteomyelitis, negative for DVT. Managed with IV Ancef , diuretics.  Overall afebrile.  Blood culture NGTD Will dc home on po lasix  and antibiotics with instruction to see PCP in w 1week   Hypertension Stable, PTA on amlodipine  . F/u w/ PCP

## 2024-04-14 NOTE — Progress Notes (Signed)
 PROGRESS NOTE Amber Figueroa  FMW:[id number] DOB: 09-Jun-1961 DOA: 04/13/2024 PCP: Antonette Angeline ORN, NP  Brief Narrative/Hospital Course: 57 yof w/ HTN, HLD, lymphedema presented to the ED for evaluation of right lower extremity pain and redness that started earlier on 04/13/24.The erythema extends from the dorsum of the foot to about the mid calf. Pain is worst in the midfoot area, and exacerbated with weightbearing no trauma/injury, bug bites In the ZI:qzampoz with a Tmax of 102, tachycardic, with a leukocytosis. X-ray Rt foot>no evidence of osteomyelitis, was diffuse subcutaneous soft tissue edema noted. US  RLE> Neg for DVT. Similar previous hospitalization 2.5 years ago Patient was placed on Ancef  and admitted for further management    Subjective: Seen and examined Having meal With feeling better right leg still red and swollen more than left Overnight temperature in 98.9 on admission 102, BP stable on room air Labs reviewed with stable BMP CBC with improving leukocytosis.  Assessment and Plan:   Sepsis POA  due to non-purulent cellulitis of the RLE in the setting of Chronic lymphedema: Patient with fever, leukocytosis but imaging did not show evidence of osteomyelitis, negative for DVT. Clinically improving, continue on Ancef .  Got Lasix  x 1 day in the ED-will redose with IV Lasix  x 1 today F/u Blood cultures   Hypertension Stable, hold amlodipine  for now  DVT prophylaxis: enoxaparin  (LOVENOX ) injection 40 mg Start: 04/13/24 2200 Code Status:   Code Status: Full Code Family Communication: plan of care discussed with patient at bedside. Patient status is: Remains hospitalized because of severity of illness Level of care: Med-Surg   Dispo: The patient is from: home, lives with son and independent at baseline            Anticipated disposition: TBD Objective: Vitals last 24 hrs: Vitals:   04/14/24 0059 04/14/24 0144 04/14/24 0153 04/14/24 0742  BP: 109/61 (!) 116/57  110/61   Pulse: 80 75  80  Resp: 17 17  18   Temp:  99.9 F (37.7 C)  99.7 F (37.6 C)  TempSrc:      SpO2: 100% 99%  100%  Weight:   84.8 kg   Height:   5' 11 (1.803 m)     Physical Examination: General exam: alert awake, older than stated age HEENT:Oral mucosa moist, Ear/Nose WNL grossly Respiratory system: Bilaterally clear BS, no use of accessory muscle Cardiovascular system: S1 & S2 +. Gastrointestinal system: Abdomen soft,  NT,ND,BS+ Nervous System: Alert, awake, following commands. Extremities: LE edema bilaterally with erythema tenderness more on right LE than LLE  Skin: No rashes,warm. MSK: Normal muscle bulk/tone.   Data Reviewed: I have personally reviewed following labs and imaging studies ( see epic result tab) CBC: Recent Labs  Lab 04/13/24 1756 04/13/24 1958 04/14/24 0455  WBC 16.4* 16.0* 14.2*  NEUTROABS 15.3*  --   --   HGB 13.4 12.2 12.0  HCT 41.4 36.0 35.9*  MCV 87.0 86.1 86.1  PLT 278 222 241   CMP: Recent Labs  Lab 04/13/24 1756 04/13/24 1958 04/14/24 0455  NA 140  --  141  K 3.4*  --  3.6  CL 104  --  103  CO2 24  --  27  GLUCOSE 124*  --  112*  BUN 11  --  9  CREATININE 0.85 0.71 0.91  CALCIUM 9.7  --  8.8*   GFR: Estimated Creatinine Clearance: 70.7 mL/min (by C-G formula based on SCr of 0.91 mg/dL). Recent Labs  Lab 04/13/24 1756  AST 23  ALT 16  ALKPHOS 78  BILITOT 1.0  PROT 8.1  ALBUMIN 4.4   No results for input(s): LIPASE, AMYLASE in the last 168 hours. No results for input(s): AMMONIA in the last 168 hours. Coagulation Profile:  Recent Labs  Lab 04/13/24 1756  INR 1.1   Unresulted Labs (From admission, onward)     Start     Ordered   04/20/24 0500  Creatinine, serum  (enoxaparin  (LOVENOX )    CrCl >/= 30 ml/min)  Weekly,   STAT     Comments: while on enoxaparin  therapy    04/13/24 1930           Antimicrobials/Microbiology: Anti-infectives (From admission, onward)    Start     Dose/Rate Route Frequency  Ordered Stop   04/14/24 0600  ceFAZolin  (ANCEF ) IVPB 2g/100 mL premix        2 g 200 mL/hr over 30 Minutes Intravenous Every 8 hours 04/13/24 1958 04/21/24 0559   04/13/24 1830  vancomycin  (VANCOCIN ) IVPB 1000 mg/200 mL premix        1,000 mg 200 mL/hr over 60 Minutes Intravenous  Once 04/13/24 1759 04/13/24 2018   04/13/24 1800  cefTRIAXone  (ROCEPHIN ) 2 g in sodium chloride  0.9 % 100 mL IVPB        2 g 200 mL/hr over 30 Minutes Intravenous  Once 04/13/24 1748 04/13/24 1858         Component Value Date/Time   SDES BLOOD RIGHT ANTECUBITAL 04/13/2024 1756   SDES BLOOD BLOOD LEFT HAND 04/13/2024 1756   SPECREQUEST  04/13/2024 1756    BOTTLES DRAWN AEROBIC AND ANAEROBIC Blood Culture results may not be optimal due to an inadequate volume of blood received in culture bottles   SPECREQUEST  04/13/2024 1756    BOTTLES DRAWN AEROBIC ONLY Blood Culture results may not be optimal due to an inadequate volume of blood received in culture bottles   CULT  04/13/2024 1756    NO GROWTH < 24 HOURS Performed at [facility], [address] [place], [place] [address]    CULT  04/13/2024 1756    NO GROWTH < 24 HOURS Performed at [facility], [address] [place], [place] [address]    REPTSTATUS PENDING 04/13/2024 1756   REPTSTATUS PENDING 04/13/2024 1756    Procedures:  Medications reviewed:  Scheduled Meds:  enoxaparin  (LOVENOX ) injection  40 mg Subcutaneous Q24H   Continuous Infusions:   ceFAZolin  (ANCEF ) IV 2 g (04/14/24 0542)    Mennie LAMY, MD [facility] 04/14/2024, 9:29 AM

## 2024-04-14 NOTE — Plan of Care (Signed)
  Problem: Education: Goal: Knowledge of General Education information will improve Description: Including pain rating scale, medication(s)/side effects and non-pharmacologic comfort measures Outcome: Progressing   Problem: Health Behavior/Discharge Planning: Goal: Ability to manage health-related needs will improve Outcome: Progressing   Problem: Education: Goal: Knowledge of General Education information will improve Description: Including pain rating scale, medication(s)/side effects and non-pharmacologic comfort measures Outcome: Progressing   Problem: Health Behavior/Discharge Planning: Goal: Ability to manage health-related needs will improve Outcome: Progressing   

## 2024-04-14 NOTE — Plan of Care (Signed)

## 2024-04-15 DIAGNOSIS — A419 Sepsis, unspecified organism: Secondary | ICD-10-CM | POA: Diagnosis not present

## 2024-04-15 DIAGNOSIS — L0390 Cellulitis, unspecified: Secondary | ICD-10-CM | POA: Diagnosis not present

## 2024-04-15 LAB — CBC
HCT: 35.5 % — ABNORMAL LOW (ref 36.0–46.0)
Hemoglobin: 11.6 g/dL — ABNORMAL LOW (ref 12.0–15.0)
MCH: 28.4 pg (ref 26.0–34.0)
MCHC: 32.7 g/dL (ref 30.0–36.0)
MCV: 87 fL (ref 80.0–100.0)
Platelets: 226 K/uL (ref 150–400)
RBC: 4.08 MIL/uL (ref 3.87–5.11)
RDW: 13.7 % (ref 11.5–15.5)
WBC: 9.2 K/uL (ref 4.0–10.5)
nRBC: 0 % (ref 0.0–0.2)

## 2024-04-15 LAB — BASIC METABOLIC PANEL WITH GFR
Anion gap: 7 (ref 5–15)
BUN: 12 mg/dL (ref 8–23)
CO2: 29 mmol/L (ref 22–32)
Calcium: 9 mg/dL (ref 8.9–10.3)
Chloride: 105 mmol/L (ref 98–111)
Creatinine, Ser: 0.93 mg/dL (ref 0.44–1.00)
GFR, Estimated: >60 mL/min (ref >60)
Glucose, Bld: 95 mg/dL (ref 70–99)
Potassium: 3.6 mmol/L (ref 3.5–5.1)
Sodium: 141 mmol/L (ref 135–145)

## 2024-04-15 MED ORDER — CEPHALEXIN 500 MG PO CAPS: 500.0000 mg | ORAL_CAPSULE | Freq: Four times a day (QID) | ORAL | 0 refills | Status: AC

## 2024-04-15 MED ORDER — FUROSEMIDE 10 MG/ML IJ SOLN: 40.0000 mg | Freq: Once | INTRAMUSCULAR | Status: DC

## 2024-04-15 MED ORDER — FUROSEMIDE 20 MG PO TABS: 20.0000 mg | ORAL_TABLET | Freq: Every day | ORAL | 7 refills | Status: DC

## 2024-04-15 NOTE — Discharge Summary (Signed)
 Physician Discharge Summary  Amber Figueroa FMW:[id number] DOB: [birth year] DOA: 04/13/2024  PCP: Antonette Angeline ORN, NP  Admit date: 04/13/2024 Discharge date: 04/15/2024 Recommendations for Outpatient Follow-up:  Follow up with PCP in 1 weeks-call for appointment Please obtain BMP/CBC in one week  Discharge Dispo: Home Discharge Condition: Stable Code Status:   Code Status: Full Code Diet recommendation:  Diet Order             Diet regular Room service appropriate? Yes; Fluid consistency: Thin  Diet effective now                    Brief/Interim Summary: 88 yof w/ HTN, HLD, lymphedema presented to the ED for evaluation of right lower extremity pain and redness that started earlier on 04/13/24.The erythema extends from the dorsum of the foot to about the mid calf. Pain is worst in the midfoot area, and exacerbated with weightbearing no trauma/injury, bug bites In the ZI:qzampoz with a Tmax of 102, tachycardic, with a leukocytosis. X-ray Rt foot>no evidence of osteomyelitis, was diffuse subcutaneous soft tissue edema noted. US  RLE> Neg for DVT. Similar previous hospitalization 2.5 years ago Patient was placed on Ancef  and also given Lasix  and admitted  Overall doing well rle is less red and tender and swelling improved altho hs lymphodema and at baselin She feels ready to go home today   Subjective: Seen and examined Having meal With feeling better right leg still red and swollen more than left Overnight temperature in 98.9 on admission 102, BP stable on room air Labs reviewed with stable BMP CBC with improving leukocytosis.  Discharge diagnosis   Sepsis POA  due to non-purulent cellulitis of the RLE in the setting of Chronic lymphedema: Patient with fever, leukocytosis but imaging did not show evidence of osteomyelitis, negative for DVT. Managed with IV Ancef , diuretics.  Overall afebrile.  Blood culture NGTD Will dc home on po lasix  and antibiotics with instruction to see PCP  in w 1week   Hypertension Stable, PTA on amlodipine  . F/u w/ PCP   Discharge Exam: Vitals:   04/15/24 0330 04/15/24 0806  BP: (!) 102/58 (!) 114/59  Pulse: 61 63  Resp: 15 17  Temp: 98.1 F (36.7 C) 98.1 F (36.7 C)  SpO2: 98% 97%   General: Pt is alert, awake, not in acute distress Cardiovascular: RRR, S1/S2 +, no rubs, no gallops Respiratory: CTA bilaterally, no wheezing, no rhonchi Abdominal: Soft, NT, ND, bowel sounds + Extremities: no edema, no cyanosis  Discharge Instructions  Discharge Instructions     Discharge instructions   Complete by: As directed    Please call call MD or return to ER for similar or worsening recurring problem that brought you to hospital or if any fever,nausea/vomiting,abdominal pain, uncontrolled pain, chest pain,  shortness of breath or any other alarming symptoms.  Please follow-up your doctor as instructed in a week time and call the office for appointment.  Please avoid alcohol, smoking, or any other illicit substance and maintain healthy habits including taking your regular medications as prescribed.  You were cared for by a hospitalist during your hospital stay. If you have any questions about your discharge medications or the care you received while you were in the hospital after you are discharged, you can call the unit and ask to speak with the hospitalist on call if the hospitalist that took care of you is not available.  Once you are discharged, your primary care physician will handle any further medical  issues. Please note that NO REFILLS for any discharge medications will be authorized once you are discharged, as it is imperative that you return to your primary care physician (or establish a relationship with a primary care physician if you do not have one) for your aftercare needs so that they can reassess your need for medications and monitor your lab values   Increase activity slowly   Complete by: As directed       Allergies as  of 04/15/2024   No Known Allergies      Medication List     TAKE these medications    albuterol  108 (90 Base) MCG/ACT inhaler Commonly known as: VENTOLIN  HFA Inhale 2 puffs into the lungs every 6 (six) hours as needed for wheezing or shortness of breath.   amLODipine  10 MG tablet Commonly known as: NORVASC  Take 1 tablet (10 mg total) by mouth daily.   cephALEXin  500 MG capsule Commonly known as: KEFLEX  Take 1 capsule (500 mg total) by mouth 4 (four) times daily for 5 days.   furosemide  20 MG tablet Commonly known as: Lasix  Take 1 tablet (20 mg total) by mouth daily for 7 days.        Follow-up Information     Antonette Angeline ORN, NP Follow up in 1 week(s).   Specialties: Internal Medicine, Emergency Medicine Contact information: [address] [place] [address] [phone]                No Known Allergies  The results of significant diagnostics from this hospitalization (including imaging, microbiology, ancillary and laboratory) are listed below for reference.    Microbiology: Recent Results (from the past 240 hours)  Blood Culture (routine x 2)     Status: None (Preliminary result)   Collection Time: 04/13/24  5:56 PM   Specimen: BLOOD  Result Value Ref Range Status   Specimen Description BLOOD RIGHT ANTECUBITAL  Final   Special Requests   Final    BOTTLES DRAWN AEROBIC AND ANAEROBIC Blood Culture results may not be optimal due to an inadequate volume of blood received in culture bottles   Culture   Final    NO GROWTH 2 DAYS Performed at [facility], [address], [place], [place] [address]    Report Status PENDING  Incomplete  Blood Culture (routine x 2)     Status: None (Preliminary result)   Collection Time: 04/13/24  5:56 PM   Specimen: BLOOD  Result Value Ref Range Status   Specimen Description BLOOD BLOOD LEFT HAND  Final   Special Requests   Final    BOTTLES DRAWN AEROBIC ONLY Blood Culture results may not be optimal due to an  inadequate volume of blood received in culture bottles   Culture   Final    NO GROWTH 2 DAYS Performed at [facility], [address], [place], [place] [address]    Report Status PENDING  Incomplete    Procedures/Studies: US  Venous Img Lower Unilateral Right Result Date: 04/13/2024 CLINICAL DATA:  Swelling EXAM: RIGHT LOWER EXTREMITY VENOUS DOPPLER ULTRASOUND TECHNIQUE: Gray-scale sonography with compression, as well as color and duplex ultrasound, were performed to evaluate the deep venous system(s) from the level of the common femoral vein through the popliteal and proximal calf veins. COMPARISON:  None Available. FINDINGS: VENOUS Normal compressibility of the common femoral, superficial femoral, and popliteal veins, as well as the visualized calf veins. Visualized portions of profunda femoral vein and great saphenous vein unremarkable. No filling  defects to suggest DVT on grayscale or color Doppler imaging. Doppler waveforms show normal direction of venous flow, normal respiratory plasticity and response to augmentation. Limited views of the contralateral common femoral vein are unremarkable. OTHER None. Limitations: none IMPRESSION: Negative. Electronically Signed   By: Greig Pique M.D.   On: 04/13/2024 19:23   DG Foot Complete Right Result Date: 04/13/2024 CLINICAL DATA:  Right leg cellulitis, eval for underlying osteomyelitis EXAM: RIGHT FOOT COMPLETE - 3+ VIEW COMPARISON:  None Available. FINDINGS: No cortical erosion or destruction. There is no evidence of fracture or dislocation. There is no evidence of arthropathy or other focal bone abnormality. Diffuse ankle and dorsum of the foot subcutaneus soft tissue edema. IMPRESSION: 1. No radiographic findings to suggest osteomyelitis. 2.  No acute displaced fracture or dislocation. 3. Diffuse subcutaneus soft tissue edema. Electronically Signed   By: Morgane  Naveau M.D.   On: 04/13/2024 18:43    Labs: BNP (last 3 results) No results  for input(s): BNP in the last 8760 hours. Basic Metabolic Panel: Recent Labs  Lab 04/13/24 1756 04/13/24 1958 04/14/24 0455 04/15/24 0445  NA 140  --  141 141  K 3.4*  --  3.6 3.6  CL 104  --  103 105  CO2 24  --  27 29  GLUCOSE 124*  --  112* 95  BUN 11  --  9 12  CREATININE 0.85 0.71 0.91 0.93  CALCIUM 9.7  --  8.8* 9.0   Liver Function Tests: Recent Labs  Lab 04/13/24 1756  AST 23  ALT 16  ALKPHOS 78  BILITOT 1.0  PROT 8.1  ALBUMIN 4.4   No results for input(s): LIPASE, AMYLASE in the last 168 hours. No results for input(s): AMMONIA in the last 168 hours. CBC: Recent Labs  Lab 04/13/24 1756 04/13/24 1958 04/14/24 0455 04/15/24 0445  WBC 16.4* 16.0* 14.2* 9.2  NEUTROABS 15.3*  --   --   --   HGB 13.4 12.2 12.0 11.6*  HCT 41.4 36.0 35.9* 35.5*  MCV 87.0 86.1 86.1 87.0  PLT 278 222 241 226  Urinalysis    Component Value Date/Time   COLORURINE YELLOW (A) 04/13/2024 1757   APPEARANCEUR CLEAR (A) 04/13/2024 1757   LABSPEC 1.014 04/13/2024 1757   PHURINE 6.0 04/13/2024 1757   GLUCOSEU NEGATIVE 04/13/2024 1757   HGBUR NEGATIVE 04/13/2024 1757   BILIRUBINUR NEGATIVE 04/13/2024 1757   KETONESUR NEGATIVE 04/13/2024 1757   PROTEINUR NEGATIVE 04/13/2024 1757   NITRITE NEGATIVE 04/13/2024 1757   LEUKOCYTESUR NEGATIVE 04/13/2024 1757   Sepsis Labs Recent Labs  Lab 04/13/24 1756 04/13/24 1958 04/14/24 0455 04/15/24 0445  WBC 16.4* 16.0* 14.2* 9.2   Microbiology Recent Results (from the past 240 hours)  Blood Culture (routine x 2)     Status: None (Preliminary result)   Collection Time: 04/13/24  5:56 PM   Specimen: BLOOD  Result Value Ref Range Status   Specimen Description BLOOD RIGHT ANTECUBITAL  Final   Special Requests   Final    BOTTLES DRAWN AEROBIC AND ANAEROBIC Blood Culture results may not be optimal due to an inadequate volume of blood received in culture bottles   Culture   Final    NO GROWTH 2 DAYS Performed at [facility], [address] [place], [place] [address]    Report Status PENDING  Incomplete  Blood Culture (routine x 2)     Status: None (Preliminary result)   Collection Time: 04/13/24  5:56 PM  Specimen: BLOOD  Result Value Ref Range Status   Specimen Description BLOOD BLOOD LEFT HAND  Final   Special Requests   Final    BOTTLES DRAWN AEROBIC ONLY Blood Culture results may not be optimal due to an inadequate volume of blood received in culture bottles   Culture   Final    NO GROWTH 2 DAYS Performed at [facility], [address] [place], [place] [address]    Report Status PENDING  Incomplete   Time coordinating discharge: 25 minutes  SIGNED: Mennie LAMY, MD  [facility] 04/15/2024, 9:11 AM  If 7PM-7AM, please contact night-coverage [email or web address]

## 2024-04-16 ENCOUNTER — Telehealth: Payer: Self-pay

## 2024-04-16 NOTE — Transitions of Care (Post Inpatient/ED Visit) (Signed)
 Today's TOC FU Call Status: Today's TOC FU Call Status:: Successful TOC FU Call Completed TOC FU Call Complete Date: 04/15/24 Patient's Name and Date of Birth confirmed.  Background: Discharged on 04/15/24 from [facility] with Diagnosis of RLE sepsis due to cellulitis.   Patient answered call and completed some of the call but declined medication review, assessments, only the basic of the TOC follow up.  She will call PCP for HFU today after this call after importance explained.  Declined further follow up calls (30-Day Program) at this time stating that she had everything she needed.   Transition Care Management Follow-up Telephone Call Date of Discharge: 04/15/24 Discharge Facility: [facility]) Type of Discharge: Inpatient Admission Primary Inpatient Discharge Diagnosis:: Sepsis due to cellulites in Right LE. How have you been since you were released from the hospital?: Better Any questions or concerns?: No  Items Reviewed: Did you receive and understand the discharge instructions provided?: Yes Medications obtained,verified, and reconciled?: No Medications Not Reviewed Reasons:: Advised Patient to Call Provider Office (Declined need to review, will call PCP for HFU today.)  Medications Reviewed Today: Patient Declined but stated she had obtained all her new medications on discharge.  Medications Reviewed Today   Medications were not reviewed in this encounter    Assessment: Not completed, patient declined. Patient was coherent and able to answer questions on the call. Stated no other issues other than her leg issue.   Home Care and Equipment/Supplies: None ordered on chart review.     Functional Questionnaire: Do you need assistance with bathing/showering or dressing?: No Do you need assistance with meal preparation?: No Do you need assistance with eating?: No Do you have difficulty maintaining continence: No Do you need assistance with getting out of  bed/getting out of a chair/moving?: No Do you have difficulty managing or taking your medications?: No  Follow up appointments reviewed: PCP Follow-up appointment confirmed?: No MD Provider Line Number:[phone] Given: No Specialist Hospital Follow-up appointment confirmed?: NA Do you need transportation to your follow-up appointment?: No Do you understand care options if your condition(s) worsen?: Yes-patient verbalized understanding  04/16/24 TOC RN CM initial outreach call : Call was answered by patient but was not fully completed due to patient declining full review.  Patient was coherent and able to answer questions on the call.  Declined further follow up calls (30-Day Program) at this time stating that she had everything she needed.    Bing Edison MSN, RN RN Case Sales executive Health  [facility] Office Hours M-F [phone] Direct Dial: [phone] Main Phone [phone]  Fax: [phone] [email or web address]

## 2024-04-18 LAB — CULTURE, BLOOD (ROUTINE X 2)
Culture: NO GROWTH
Culture: NO GROWTH

## 2024-04-20 ENCOUNTER — Ambulatory Visit (INDEPENDENT_AMBULATORY_CARE_PROVIDER_SITE_OTHER): Payer: PRIVATE HEALTH INSURANCE | Admitting: Internal Medicine

## 2024-04-20 ENCOUNTER — Encounter: Payer: Self-pay | Admitting: Internal Medicine

## 2024-04-20 VITALS — BP 130/60 | Ht 71.0 in | Wt 186.2 lb

## 2024-04-20 DIAGNOSIS — L03115 Cellulitis of right lower limb: Secondary | ICD-10-CM

## 2024-04-20 DIAGNOSIS — I890 Lymphedema, not elsewhere classified: Secondary | ICD-10-CM

## 2024-04-20 NOTE — Patient Instructions (Signed)
 Cellulitis, Adult    Cellulitis is a skin infection. The infected area is often warm, red, swollen, and sore. It occurs most often on the legs, feet, and toes, but can happen on any part of the body.  This condition can be life-threatening without treatment. It is very important to get treated right away.  What are the causes?  This condition is caused by bacteria. The bacteria enter through a break in the skin, such as:  A cut.  A burn.  A bug bite.  An animal bite.  An open sore.  A crack.  What increases the risk?  Having a weak body's defense system (immune system).  Being older than 64 years old.  Having a blood sugar problem (diabetes).  Having a long-term liver disease (cirrhosis) or kidney disease.  Being very overweight (obese).  Having a skin problem, such as:  An itchy rash.  A rash caused by a fungus.  A rash with blisters.  Slow movement of blood in the veins (venous stasis).  Fluid buildup below the skin (edema).  This condition is more likely to occur in people who:  Have open cuts, burns, bites, or scrapes on the skin.  Have been treated with high-energy rays (radiation).  Use IV drugs.  What are the signs or symptoms?  Skin that:  Looks red or purple, or slightly darker than your usual skin color.  Has streaks.  Has spots.  Is swollen.  Is sore or painful when you touch it.  Is warm.  A fever.  Chills.  Blisters.  Tiredness (fatigue).  How is this treated?  Medicines to treat infections or allergies.  Rest.  Placing cold or warm cloths on the skin.  Staying in the hospital, if the condition is very bad. You may need medicines through an IV.  Follow these instructions at home:  Medicines  Take over-the-counter and prescription medicines only as told by your doctor.  If you were prescribed antibiotics, take them as told by your doctor. Do not stop using them even if you start to feel better.  General instructions  Drink enough fluid to keep your pee (urine) pale yellow.  Do not touch or rub the  infected area.  Raise (elevate) the infected area above the level of your heart while you are sitting or lying down.  Return to your normal activities when your doctor says that it is safe.  Place cold or warm cloths on the area as told by your doctor.  Keep all follow-up visits. Your doctor will need to make sure that a more serious infection is not developing.  Contact a doctor if:  You have a fever.  You do not start to get better after 1-2 days of treatment.  Your bone or joint under the infected area starts to hurt after the skin has healed.  Your infection comes back in the same area or another area. Signs of this may include:  You have a swollen bump in the area.  Your red area gets larger, turns dark in color, or hurts more.  You have more fluid coming from the wound.  Pus or a bad smell develops in your infected area.  You have more pain.  You feel sick and have muscle aches and weakness.  You develop vomiting or watery poop that will not go away.  Get help right away if:  You see red streaks coming from the area.  You notice the skin turns purple or black and falls  off.  These symptoms may be an emergency. Get help right away. Call 911.  Do not wait to see if the symptoms will go away.  Do not drive yourself to the hospital.  This information is not intended to replace advice given to you by your health care provider. Make sure you discuss any questions you have with your health care provider.  Document Revised: 05/14/2022 Document Reviewed: 05/14/2022  Elsevier Patient Education  2024 [employer].

## 2024-04-20 NOTE — Progress Notes (Signed)
 Subjective:    Patient ID: Amber Figueroa, female    DOB: May 14, 1961, 63 y.o.   MRN: [id number]  HPI  Patient presents to clinic today for TCM hospital follow-up.  She presented to the ER 7/15 with complaint of redness, swelling and pain of her right lower extremity.  Initial labs showed a WBC count of 16.4 but lactic acid was normal.  X-ray of the right lower extremity did not show any osteomyelitis but did show diffuse soft tissue edema.  Ultrasound of the right lower extremity did not show any evidence of DVT.  She was diagnosed with cellulitis secondary to lymphedema and was treated with IV Ancef  and furosemide .  She was discharged on 7/17 with a 5-day course of cephalexin  and furosemide .  Since that time, she reports the swelling has improved.  The redness and discomfort has completely gone away.  She has not noticed any fever, chills or bodyaches.  She continues to take her cephalexin  and furosemide  as previously prescribed.  Review of Systems   Past Medical History:  Diagnosis Date   Back pain    Hypertension     Current Outpatient Medications  Medication Sig Dispense Refill   albuterol  (VENTOLIN  HFA) 108 (90 Base) MCG/ACT inhaler Inhale 2 puffs into the lungs every 6 (six) hours as needed for wheezing or shortness of breath. 8 g 0   amLODipine  (NORVASC ) 10 MG tablet Take 1 tablet (10 mg total) by mouth daily. 90 tablet 1   cephALEXin  (KEFLEX ) 500 MG capsule Take 1 capsule (500 mg total) by mouth 4 (four) times daily for 5 days. 20 capsule 0   furosemide  (LASIX ) 20 MG tablet Take 1 tablet (20 mg total) by mouth daily for 7 days. 7 tablet 7   No current facility-administered medications for this visit.    No Known Allergies  Family History  Problem Relation Age of Onset   Cancer Mother    Cancer Father        unknown type   Hypertension Sister    Hypertension Brother    Hyperlipidemia Brother    Breast cancer Maternal Grandmother        likely breast ca - mastectomy    Hypertension Brother    Diabetes Brother    Hypertension Brother    Heart attack Neg Hx    Stroke Neg Hx    Colon cancer Neg Hx    Ovarian cancer Neg Hx     Social History   Socioeconomic History   Marital status: Married    Spouse name: Not on file   Number of children: Not on file   Years of education: Not on file   Highest education level: Not on file  Occupational History   Occupation: warehouse  Tobacco Use   Smoking status: Every Day    Current packs/day: 1.00    Average packs/day: 1 pack/day for 1 year (1.0 ttl pk-yrs)    Types: Cigarettes   Smokeless tobacco: Never   Tobacco comments:    age [age] smoked 1/2 ppd, quit x 20 years, now smoking 1 year  Vaping Use   Vaping status: Never Used  Substance and Sexual Activity   Alcohol use: No   Drug use: No   Sexual activity: Never  Other Topics Concern   Not on file  Social History Narrative   Not on file   Social Drivers of Health   Financial Resource Strain: Not on file  Food Insecurity: Not on file  Transportation Needs: Not  on file  Physical Activity: Not on file  Stress: Not on file  Social Connections: Not on file  Intimate Partner Violence: Not on file     Constitutional: Denies fever, malaise, fatigue, headache or abrupt weight changes.  Respiratory: Denies difficulty breathing, shortness of breath, cough or sputum production.   Cardiovascular: Patient reports chronic swelling in legs.  Denies chest pain, chest tightness, palpitations or swelling in the hands.  Musculoskeletal: Denies decrease in range of motion, difficulty with gait, muscle pain or joint pain and swelling.  Skin: Denies redness, rashes, lesions or ulcercations.   No other specific complaints in a complete review of systems (except as listed in HPI above).      Objective:   Physical Exam BP 130/60 (BP Location: Right Arm, Patient Position: Sitting, Cuff Size: Normal)   Ht 5' 11 (1.803 m)   Wt 186 lb 3.2 oz (84.5 kg)   BMI  25.97 kg/m   Wt Readings from Last 3 Encounters:  04/14/24 186 lb 15.2 oz (84.8 kg)  02/06/24 193 lb 3.2 oz (87.6 kg)  10/14/23 183 lb 3.2 oz (83.1 kg)    General: Appears her stated age, overweight, in NAD. Skin: Warm, dry and intact. No rashes, warmth or ulcerations noted of right lower extremity. Cardiovascular: Normal rate and rhythm. S1,S2 noted.  No murmur, rubs or gallops noted. No JVD.  1+ nonpitting BLE edema. No carotid bruits noted. Pulmonary/Chest: Normal effort and positive vesicular breath sounds. No respiratory distress. No wheezes, rales or ronchi noted.  Musculoskeletal: No difficulty with gait.  Neurological: Alert and oriented. Coordination normal.    BMET    Component Value Date/Time   NA 141 04/15/2024 0445   K 3.6 04/15/2024 0445   CL 105 04/15/2024 0445   CO2 29 04/15/2024 0445   GLUCOSE 95 04/15/2024 0445   BUN 12 04/15/2024 0445   CREATININE 0.93 04/15/2024 0445   CREATININE 0.84 02/06/2024 1333   CALCIUM 9.0 04/15/2024 0445   GFRNONAA >60 04/15/2024 0445   GFRNONAA 89 06/09/2017 0832   GFRAA >60 04/08/2019 0226   GFRAA 103 06/09/2017 0832    Lipid Panel     Component Value Date/Time   CHOL 187 02/06/2024 1333   TRIG 47 02/06/2024 1333   HDL 62 02/06/2024 1333   CHOLHDL 3.0 02/06/2024 1333   LDLCALC 111 (H) 02/06/2024 1333    CBC    Component Value Date/Time   WBC 9.2 04/15/2024 0445   RBC 4.08 04/15/2024 0445   HGB 11.6 (L) 04/15/2024 0445   HCT 35.5 (L) 04/15/2024 0445   PLT 226 04/15/2024 0445   MCV 87.0 04/15/2024 0445   MCH 28.4 04/15/2024 0445   MCHC 32.7 04/15/2024 0445   RDW 13.7 04/15/2024 0445   LYMPHSABS 0.5 (L) 04/13/2024 1756   MONOABS 0.5 04/13/2024 1756   EOSABS 0.0 04/13/2024 1756   BASOSABS 0.0 04/13/2024 1756    Hgb A1C Lab Results  Component Value Date   HGBA1C 5.5 02/06/2024            Assessment & Plan:  TCM hospital follow-up for cellulitis of right lower extremity secondary  lymphedema:  Hospital notes, labs and imaging reviewed Clinically improved on exam today Continue cephalexin  and furosemide  as previously prescribed Encourage elevation of the legs and continued use of compression hose Discussed the importance of calling us  at the first signs of cellulitis to prevent further hospitalization  RTC in 4 months for follow-up of chronic conditions Angeline Laura, NP

## 2024-06-30 DIAGNOSIS — C50512 Malignant neoplasm of lower-outer quadrant of left female breast: Secondary | ICD-10-CM

## 2024-06-30 HISTORY — DX: Estrogen receptor negative status (ER-): C50.512

## 2024-07-15 ENCOUNTER — Encounter: Payer: Self-pay | Admitting: Internal Medicine

## 2024-07-15 ENCOUNTER — Ambulatory Visit: Admitting: Internal Medicine

## 2024-07-15 VITALS — BP 138/76 | Ht 71.0 in | Wt 182.2 lb

## 2024-07-15 DIAGNOSIS — N6325 Unspecified lump in the left breast, overlapping quadrants: Secondary | ICD-10-CM

## 2024-07-15 NOTE — Patient Instructions (Signed)
Breast Scan A breast scan is an imaging test that is done to examine dense breast tissue. A breast scan is different from a mammogram. This scan is done using a radioactive material that produces images of the breast. A breast scan is used in people who have breast lesions that result from: Rope-like, lumpy tissue (fibrocystic disease). Solid, painless lumps (fibroadenoma). Damaged fatty breast tissue (fat necrosis). A breast scan may also be done to find out the best treatment for people who have breast cancer. Tell a health care provider about: Any allergies you have to medicines, contrast dyes, shellfish, or iodine. All medicines you are taking, including vitamins, herbs, eye drops, creams, and over-the-counter medicines. Any problems you or family members have had with anesthetic medicines. Any bleeding problems you have. Any surgeries you have had. Any medical conditions you have. Whether you are pregnant or may be pregnant. Whether you are breastfeeding. What are the risks? Your health care provider will talk with you about risks. These may include: Slight discomfort from the injection of a radioactive substance. Allergic reaction to the radioactive substance used during the test. What happens before the test? Ask your health care provider about changing or stopping your regular medicines. These include any diabetes medicines or blood thinners you take. What happens during the test?  You will be asked to remove all jewelry and clothing from the waist up. An IV will be inserted into one of your veins. You will be asked to lie face-down on a table. The breast that will be scanned will be placed through an opening in the table. You may also be asked to get into different positions during the scan. The radioactive agent will be injected into the IV. You may have a slight metallic taste in your mouth after the injection. A scanner will be placed over the breast to record the radiation,  which produces an image of the breast. The procedure may be repeated on the other breast. When the scan is complete, the IV will be removed. The procedure may vary among health care providers and hospitals. What can I expect after the test? You will be asked to get up slowly. This helps you avoid light-headedness after lying flat during the test. It is up to you to get the results of your procedure. Ask your health care provider, or the department that is doing the procedure, when your results will be ready. Follow these instructions at home: Drink enough fluid to keep your urine pale yellow. This helps to flush out the remaining radioactive substance from your body. Keep all follow-up visits. Your health care provider will talk with you about the results and if treatment is needed. This information is not intended to replace advice given to you by your health care provider. Make sure you discuss any questions you have with your health care provider. Document Revised: 03/06/2022 Document Reviewed: 03/06/2022 Elsevier Patient Education  2024 [employer].

## 2024-07-15 NOTE — Progress Notes (Signed)
 Subjective:    Patient ID: Amber Figueroa, female    DOB: 1961/03/24, 63 y.o.   MRN: [id number]  HPI  Discussed the use of AI scribe software for clinical note transcription with the patient, who gave verbal consent to proceed.  Amber Figueroa is a 63 year old female who presents with a breast lump noticed four weeks ago.  A lump was noticed in her her left breast approximately four weeks ago. The lump has increased in size over this period and sometimes causes pain, but there is no drainage. She has not had a mammogram this year, with her last one being in May 2024.  Her daughter was in the ICU for over a week, which delayed her seeking medical attention for the lump.  She is uncertain if her grandmother had breast cancer, but her grandmother had to have her breasts removed at an advanced age.       Review of Systems     Past Medical History:  Diagnosis Date   Back pain    Hypertension     Current Outpatient Medications  Medication Sig Dispense Refill   albuterol  (VENTOLIN  HFA) 108 (90 Base) MCG/ACT inhaler Inhale 2 puffs into the lungs every 6 (six) hours as needed for wheezing or shortness of breath. 8 g 0   amLODipine  (NORVASC ) 10 MG tablet Take 1 tablet (10 mg total) by mouth daily. 90 tablet 1   furosemide  (LASIX ) 20 MG tablet Take 1 tablet (20 mg total) by mouth daily for 7 days. (Patient not taking: Reported on 07/15/2024) 7 tablet 7   No current facility-administered medications for this visit.    No Known Allergies  Family History  Problem Relation Age of Onset   Cancer Mother    Cancer Father        unknown type   Hypertension Sister    Hypertension Brother    Hyperlipidemia Brother    Breast cancer Maternal Grandmother        likely breast ca - mastectomy   Hypertension Brother    Diabetes Brother    Hypertension Brother    Heart attack Neg Hx    Stroke Neg Hx    Colon cancer Neg Hx    Ovarian cancer Neg Hx     Social History   Socioeconomic  History   Marital status: Married    Spouse name: Not on file   Number of children: Not on file   Years of education: Not on file   Highest education level: Not on file  Occupational History   Occupation: warehouse  Tobacco Use   Smoking status: Every Day    Current packs/day: 1.00    Average packs/day: 1 pack/day for 1 year (1.0 ttl pk-yrs)    Types: Cigarettes   Smokeless tobacco: Never   Tobacco comments:    age [age] smoked 1/2 ppd, quit x 20 years, now smoking 1 year  Vaping Use   Vaping status: Never Used  Substance and Sexual Activity   Alcohol use: No   Drug use: No   Sexual activity: Never  Other Topics Concern   Not on file  Social History Narrative   Not on file   Social Drivers of Health   Financial Resource Strain: Not on file  Food Insecurity: Not on file  Transportation Needs: Not on file  Physical Activity: Not on file  Stress: Not on file  Social Connections: Not on file  Intimate Partner Violence: Not on file  Constitutional: Denies fever, malaise, fatigue, headache or abrupt weight changes.  Respiratory: Denies difficulty breathing, shortness of breath, cough or sputum production.   Cardiovascular: Patient reports swelling in legs.  Denies chest pain, chest tightness, palpitations or swelling in the hands.  Skin: Patient reports lump in breast.  Denies redness, rashes, lesions or ulcercations.    No other specific complaints in a complete review of systems (except as listed in HPI above).  Objective:   Physical Exam BP (!) 142/80 (BP Location: Left Arm, Patient Position: Sitting, Cuff Size: Normal)   Ht 5' 11 (1.803 m)   Wt 182 lb 3.2 oz (82.6 kg)   BMI 25.41 kg/m    Wt Readings from Last 3 Encounters:  07/15/24 182 lb 3.2 oz (82.6 kg)  04/20/24 186 lb 3.2 oz (84.5 kg)  04/14/24 186 lb 15.2 oz (84.8 kg)    General: Appears her stated age, overweight,  in NAD. Skin: Breast symmetrical.  5 cm x 5 cm hard mass noted at 6:00 in the  left breast.  No dimpling of the skin.  No discharge from the nipple.  No axillary adenopathy. Cardiovascular: Normal rate and rhythm.  Pulmonary/Chest: Normal effort and positive vesicular breath sounds. No respiratory distress. No wheezes, rales or ronchi noted.  Neurological: Alert and oriented.    BMET    Component Value Date/Time   NA 141 04/15/2024 0445   K 3.6 04/15/2024 0445   CL 105 04/15/2024 0445   CO2 29 04/15/2024 0445   GLUCOSE 95 04/15/2024 0445   BUN 12 04/15/2024 0445   CREATININE 0.93 04/15/2024 0445   CREATININE 0.84 02/06/2024 1333   CALCIUM 9.0 04/15/2024 0445   GFRNONAA >60 04/15/2024 0445   GFRNONAA 89 06/09/2017 0832   GFRAA >60 04/08/2019 0226   GFRAA 103 06/09/2017 0832    Lipid Panel     Component Value Date/Time   CHOL 187 02/06/2024 1333   TRIG 47 02/06/2024 1333   HDL 62 02/06/2024 1333   CHOLHDL 3.0 02/06/2024 1333   LDLCALC 111 (H) 02/06/2024 1333    CBC    Component Value Date/Time   WBC 9.2 04/15/2024 0445   RBC 4.08 04/15/2024 0445   HGB 11.6 (L) 04/15/2024 0445   HCT 35.5 (L) 04/15/2024 0445   PLT 226 04/15/2024 0445   MCV 87.0 04/15/2024 0445   MCH 28.4 04/15/2024 0445   MCHC 32.7 04/15/2024 0445   RDW 13.7 04/15/2024 0445   LYMPHSABS 0.5 (L) 04/13/2024 1756   MONOABS 0.5 04/13/2024 1756   EOSABS 0.0 04/13/2024 1756   BASOSABS 0.0 04/13/2024 1756    Hgb A1C Lab Results  Component Value Date   HGBA1C 5.5 02/06/2024            Assessment & Plan:  Assessment and Plan    Palpable left breast mass Palpable mass in the left breast for four weeks, increasing in size, occasionally painful. Further imaging required. - Order diagnostic mammogram and ultrasound of the right breast. - Instruct her to await phone call to schedule imaging. - Informed her that ultrasound results will be provided before leaving the facility.      RTC in 2 months, follow-up chronic conditions Angeline Laura, NP

## 2024-07-18 ENCOUNTER — Other Ambulatory Visit: Payer: Self-pay | Admitting: Internal Medicine

## 2024-07-19 ENCOUNTER — Other Ambulatory Visit: Payer: Self-pay | Admitting: Internal Medicine

## 2024-07-19 ENCOUNTER — Ambulatory Visit
Admission: RE | Admit: 2024-07-19 | Discharge: 2024-07-19 | Disposition: A | Source: Ambulatory Visit | Attending: Internal Medicine | Admitting: Internal Medicine

## 2024-07-19 ENCOUNTER — Ambulatory Visit
Admission: RE | Admit: 2024-07-19 | Discharge: 2024-07-19 | Disposition: A | Discharge status: Home / Self Care | Source: Ambulatory Visit | Attending: Internal Medicine | Admitting: Internal Medicine

## 2024-07-19 DIAGNOSIS — N6325 Unspecified lump in the left breast, overlapping quadrants: Secondary | ICD-10-CM | POA: Diagnosis present

## 2024-07-19 DIAGNOSIS — R928 Other abnormal and inconclusive findings on diagnostic imaging of breast: Secondary | ICD-10-CM | POA: Insufficient documentation

## 2024-07-20 NOTE — Telephone Encounter (Signed)
 Requested Prescriptions  Pending Prescriptions Disp Refills   amLODipine  (NORVASC ) 10 MG tablet [Pharmacy Med Name: AMLODIPINE  BESYLATE 10 MG TAB] 90 tablet 0    Sig: TAKE 1 TABLET BY MOUTH EVERY DAY     Cardiovascular: Calcium Channel Blockers 2 Passed - 07/20/2024 12:29 PM      Passed - Last BP in normal range    BP Readings from Last 1 Encounters:  07/15/24 138/76         Passed - Last Heart Rate in normal range    Pulse Readings from Last 1 Encounters:  04/15/24 63         Passed - Valid encounter within last 6 months    Recent Outpatient Visits           5 days ago Mass overlapping multiple quadrants of left breast   [facility] [place], Angeline ORN, NP   3 months ago Cellulitis of right lower extremity   [facility] [place], Angeline ORN, NP   5 months ago Encounter for general adult medical examination with abnormal findings    [facility] [place], Angeline ORN, NP

## 2024-07-21 ENCOUNTER — Ambulatory Visit
Admission: RE | Admit: 2024-07-21 | Discharge: 2024-07-21 | Disposition: A | Discharge status: Home / Self Care | Source: Ambulatory Visit | Attending: Internal Medicine | Admitting: Internal Medicine

## 2024-07-21 DIAGNOSIS — C50812 Malignant neoplasm of overlapping sites of left female breast: Secondary | ICD-10-CM | POA: Insufficient documentation

## 2024-07-21 DIAGNOSIS — N6325 Unspecified lump in the left breast, overlapping quadrants: Secondary | ICD-10-CM | POA: Diagnosis present

## 2024-07-21 HISTORY — PX: BREAST BIOPSY: SHX20

## 2024-07-21 MED ORDER — LIDOCAINE-EPINEPHRINE 1 %-1:100000 IJ SOLN
8.0000 mL | Freq: Once | INTRAMUSCULAR | Status: AC
  Administered 2024-07-21: 8 mL via INTRADERMAL
  Filled 2024-07-21: qty 8

## 2024-07-21 MED ORDER — LIDOCAINE 1 % OPTIME INJ - NO CHARGE
2.0000 mL | Freq: Once | INTRAMUSCULAR | Status: AC
  Administered 2024-07-21: 2 mL via INTRADERMAL
  Filled 2024-07-21: qty 2

## 2024-07-22 ENCOUNTER — Encounter: Payer: Self-pay | Admitting: *Deleted

## 2024-07-22 DIAGNOSIS — C50919 Malignant neoplasm of unspecified site of unspecified female breast: Secondary | ICD-10-CM

## 2024-07-22 LAB — SURGICAL PATHOLOGY

## 2024-07-22 NOTE — Progress Notes (Signed)
 Received referral for newly diagnosed breast cancer from [facility].  Navigation initiated.  She will see Dr. Melanee on 10/28 at 10:45.  Referral placed to [facility], their office will call her with an appt.

## 2024-07-27 ENCOUNTER — Inpatient Hospital Stay

## 2024-07-27 ENCOUNTER — Encounter: Payer: Self-pay | Admitting: *Deleted

## 2024-07-27 ENCOUNTER — Other Ambulatory Visit: Payer: Self-pay | Admitting: Oncology

## 2024-07-27 ENCOUNTER — Encounter: Payer: Self-pay | Admitting: Oncology

## 2024-07-27 ENCOUNTER — Inpatient Hospital Stay: Attending: Oncology | Admitting: Oncology

## 2024-07-27 ENCOUNTER — Ambulatory Visit
Admission: RE | Admit: 2024-07-27 | Discharge: 2024-07-27 | Disposition: A | Discharge status: Home / Self Care | Source: Ambulatory Visit | Attending: Oncology | Admitting: Oncology

## 2024-07-27 ENCOUNTER — Telehealth: Payer: Self-pay | Admitting: Surgery

## 2024-07-27 VITALS — BP 126/67 | HR 89 | Temp 96.9°F | Resp 19 | Ht 71.0 in | Wt 183.7 lb

## 2024-07-27 DIAGNOSIS — F17210 Nicotine dependence, cigarettes, uncomplicated: Secondary | ICD-10-CM | POA: Diagnosis not present

## 2024-07-27 DIAGNOSIS — Z1722 Progesterone receptor negative status: Secondary | ICD-10-CM | POA: Insufficient documentation

## 2024-07-27 DIAGNOSIS — I2510 Atherosclerotic heart disease of native coronary artery without angina pectoris: Secondary | ICD-10-CM | POA: Diagnosis not present

## 2024-07-27 DIAGNOSIS — Z171 Estrogen receptor negative status [ER-]: Secondary | ICD-10-CM

## 2024-07-27 DIAGNOSIS — Z803 Family history of malignant neoplasm of breast: Secondary | ICD-10-CM | POA: Diagnosis not present

## 2024-07-27 DIAGNOSIS — Z79899 Other long term (current) drug therapy: Secondary | ICD-10-CM | POA: Insufficient documentation

## 2024-07-27 DIAGNOSIS — C50512 Malignant neoplasm of lower-outer quadrant of left female breast: Secondary | ICD-10-CM | POA: Insufficient documentation

## 2024-07-27 DIAGNOSIS — E7800 Pure hypercholesterolemia, unspecified: Secondary | ICD-10-CM | POA: Diagnosis not present

## 2024-07-27 DIAGNOSIS — I10 Essential (primary) hypertension: Secondary | ICD-10-CM | POA: Insufficient documentation

## 2024-07-27 DIAGNOSIS — Z9189 Other specified personal risk factors, not elsewhere classified: Secondary | ICD-10-CM

## 2024-07-27 DIAGNOSIS — Z1732 Human epidermal growth factor receptor 2 negative status: Secondary | ICD-10-CM | POA: Insufficient documentation

## 2024-07-27 DIAGNOSIS — Z7189 Other specified counseling: Secondary | ICD-10-CM

## 2024-07-27 LAB — CMP (CANCER CENTER ONLY)
ALT: 12 U/L (ref 0–44)
AST: 17 U/L (ref 15–41)
Albumin: 4 g/dL (ref 3.5–5.0)
Alkaline Phosphatase: 72 U/L (ref 38–126)
Anion gap: 7 (ref 5–15)
BUN: 15 mg/dL (ref 8–23)
CO2: 28 mmol/L (ref 22–32)
Calcium: 9.1 mg/dL (ref 8.9–10.3)
Chloride: 104 mmol/L (ref 98–111)
Creatinine: 0.97 mg/dL (ref 0.44–1.00)
GFR, Estimated: >60 mL/min (ref >60)
Glucose, Bld: 94 mg/dL (ref 70–99)
Potassium: 4.6 mmol/L (ref 3.5–5.1)
Sodium: 139 mmol/L (ref 135–145)
Total Bilirubin: 0.8 mg/dL (ref 0.0–1.2)
Total Protein: 7.3 g/dL (ref 6.5–8.1)

## 2024-07-27 LAB — CBC WITH DIFFERENTIAL (CANCER CENTER ONLY)
Abs Immature Granulocytes: 0.01 K/uL (ref 0.00–0.07)
Basophils Absolute: 0.1 K/uL (ref 0.0–0.1)
Basophils Relative: 1 %
Eosinophils Absolute: 0.2 K/uL (ref 0.0–0.5)
Eosinophils Relative: 3 %
HCT: 41.9 % (ref 36.0–46.0)
Hemoglobin: 13.7 g/dL (ref 12.0–15.0)
Immature Granulocytes: 0 %
Lymphocytes Relative: 32 %
Lymphs Abs: 1.6 K/uL (ref 0.7–4.0)
MCH: 28.4 pg (ref 26.0–34.0)
MCHC: 32.7 g/dL (ref 30.0–36.0)
MCV: 86.7 fL (ref 80.0–100.0)
Monocytes Absolute: 0.5 K/uL (ref 0.1–1.0)
Monocytes Relative: 9 %
Neutro Abs: 2.9 K/uL (ref 1.7–7.7)
Neutrophils Relative %: 55 %
Platelet Count: 258 K/uL (ref 150–400)
RBC: 4.83 MIL/uL (ref 3.87–5.11)
RDW: 14.2 % (ref 11.5–15.5)
WBC Count: 5.2 K/uL (ref 4.0–10.5)
nRBC: 0 % (ref 0.0–0.2)

## 2024-07-27 LAB — GENETIC SCREENING ORDER

## 2024-07-27 MED ORDER — GADOBUTROL 1 MMOL/ML IV SOLN
8.0000 mL | Freq: Once | INTRAVENOUS | Status: AC | PRN
Start: 2024-07-27 — End: 2024-07-27
  Administered 2024-07-27: 8 mL via INTRAVENOUS

## 2024-07-27 MED ORDER — GADOBUTROL 1 MMOL/ML IV SOLN: 9.0000 mL | Freq: Once | INTRAVENOUS | Status: DC | PRN

## 2024-07-27 MED ORDER — DEXAMETHASONE 4 MG PO TABS: ORAL_TABLET | ORAL | 1 refills | Status: AC

## 2024-07-27 MED ORDER — PROCHLORPERAZINE MALEATE 10 MG PO TABS: 10.0000 mg | ORAL_TABLET | Freq: Four times a day (QID) | ORAL | 1 refills | Status: AC | PRN

## 2024-07-27 MED ORDER — ONDANSETRON HCL 8 MG PO TABS: 8.0000 mg | ORAL_TABLET | Freq: Three times a day (TID) | ORAL | 1 refills | Status: AC | PRN

## 2024-07-27 MED ORDER — LIDOCAINE-PRILOCAINE 2.5-2.5 % EX CREA: TOPICAL_CREAM | CUTANEOUS | 3 refills | Status: AC

## 2024-07-27 NOTE — Telephone Encounter (Signed)
 Patient has been advised of Pre-Admission date/time, and Surgery date at [facility].  Surgery Date: 08/03/24 Preadmission Testing Date: 07/30/24 (phone 1p-4p)  Patient informed of the scheduling process and surgery information will be given at time of office visit on 08/02/24.    Patient has been made aware to call [phone], between 1-3:00pm the day before surgery, to find out what time to arrive for surgery.

## 2024-07-27 NOTE — Progress Notes (Signed)
 Pharmacist Chemotherapy Monitoring - Initial Assessment    Anticipated start date: 08/06/24   The following has been reviewed per standard work regarding the patient's treatment regimen: The patient's diagnosis, treatment plan and drug doses, and organ/hematologic function Lab orders and baseline tests specific to treatment regimen  The treatment plan start date, drug sequencing, and pre-medications Prior authorization status  Patient's documented medication list, including drug-drug interaction screen and prescriptions for anti-emetics and supportive care specific to the treatment regimen The drug concentrations, fluid compatibility, administration routes, and timing of the medications to be used The patient's access for treatment and lifetime cumulative dose history, if applicable  The patient's medication allergies and previous infusion related reactions, if applicable   Changes made to treatment plan:  N/A  Follow up needed:  PA pending Port placement sched for 08/03/24 ECHO sched for 08/05/24 (for A/C portion of tx; phase 2)   Wilma Dollar, Pharm.D., CPP 07/27/2024@4 :00 PM

## 2024-07-27 NOTE — Progress Notes (Signed)
 Amber Figueroa has been scheduled for MRI, PET, Echo and port placement for next week.   With plan to start chemo on 11/7.  Appt. Details have been given to Amber Figueroa.

## 2024-07-27 NOTE — Progress Notes (Signed)
 Hematology/Oncology Consult note [facility] [phone] [phone]  Patient Care Team: Antonette Angeline ORN, NP as PCP - General (Internal Medicine) Georgina Shasta POUR, RN as Oncology Nurse Navigator Melanee Annah BROCKS, MD as Consulting Physician (Oncology)   Name of the patient: Amber Figueroa  [id number]  1961-06-17    Reason for referral-new diagnosis of breast cancer   Referring physician-Regina Antonette, NP  Date of visit: 07/27/24   History of presenting illness- patient is a 63 year old female with a past medical history significant for hypertension who underwent a bilateral diagnostic mammogram on 07/19/2024 for a palpable left breast lump..  Mammogram showed a 3 cm highly suspicious mass in the left breast.  No left axillary adenopathy.  Small benign right breast cysts.  No evidence of right breast malignancy.  Patient underwent core biopsy of the left breast mass which was consistent with invasive mammary carcinoma grade 2 1.4 cm ER 0% negative, PR 0% negative, Ki-67 25% and HER2 negative +1  Patient lives with her son and grandson.  She works at [employer] and is independent of her ADLs and IADLs.  Menarche at 24.  G2 P2.  Age at first birth 21.  She used birth control pills in the past remotely.  Maternal grandmother with possible history of breast cancer.  She is a [employer] Witness  Patient noticed left breast mass as early as last year and apparently mentioned it at the time of her last mammogram Which was unremarkable  ECOG PS- 0  Pain scale- 0   Review of systems- Review of Systems  Constitutional:  Negative for chills, fever, malaise/fatigue and weight loss.  HENT:  Negative for congestion, ear discharge and nosebleeds.   Eyes:  Negative for blurred vision.  Respiratory:  Negative for cough, hemoptysis, sputum production, shortness of breath and wheezing.   Cardiovascular:  Negative for chest pain, palpitations, orthopnea and  claudication.  Gastrointestinal:  Negative for abdominal pain, blood in stool, constipation, diarrhea, heartburn, melena, nausea and vomiting.  Genitourinary:  Negative for dysuria, flank pain, frequency, hematuria and urgency.  Musculoskeletal:  Negative for back pain, joint pain and myalgias.  Skin:  Negative for rash.  Neurological:  Negative for dizziness, tingling, focal weakness, seizures, weakness and headaches.  Endo/Heme/Allergies:  Does not bruise/bleed easily.  Psychiatric/Behavioral:  Negative for depression and suicidal ideas. The patient does not have insomnia.     No Known Allergies  Patient Active Problem List   Diagnosis Date Noted   Overweight with body mass index (BMI) of 26 to 26.9 in adult 02/06/2024   Pure hypercholesterolemia 08/05/2022   Primary hypertension 01/16/2022   Normocytic anemia 12/05/2021   Lymphedema 05/22/2021     Past Medical History:  Diagnosis Date   Back pain    Hypertension      Past Surgical History:  Procedure Laterality Date   BREAST BIOPSY Left 07/21/2024   US  LT BREAST BX W LOC DEV 1ST LESION IMG BX SPEC US  GUIDE 07/21/2024 [facility]   COLONOSCOPY WITH PROPOFOL  N/A 07/21/2017   Procedure: COLONOSCOPY WITH PROPOFOL ;  Surgeon: Therisa Bi, MD;  Location: [facility] ENDOSCOPY;  Service: Gastroenterology;  Laterality: N/A;   KNEE SURGERY      Social History   Socioeconomic History   Marital status: Married    Spouse name: Not on file   Number of children: Not on file   Years of education: Not on file   Highest education level: Not on file  Occupational History  Occupation: naval architect  Tobacco Use   Smoking status: Every Day    Current packs/day: 1.00    Average packs/day: 1 pack/day for 1 year (1.0 ttl pk-yrs)    Types: Cigarettes   Smokeless tobacco: Never   Tobacco comments:    age [age] smoked 1/2 ppd, quit x 20 years, now smoking 1 year  Vaping Use   Vaping status: Never Used  Substance and Sexual Activity    Alcohol use: No   Drug use: No   Sexual activity: Never  Other Topics Concern   Not on file  Social History Narrative   Not on file   Social Drivers of Health   Financial Resource Strain: Not on file  Food Insecurity: No Food Insecurity (07/23/2024)   Hunger Vital Sign    Worried About Running Out of Food in the Last Year: Never true    Ran Out of Food in the Last Year: Never true  Transportation Needs: No Transportation Needs (07/23/2024)   PRAPARE - Administrator, Civil Service (Medical): No    Lack of Transportation (Non-Medical): No  Physical Activity: Not on file  Stress: Not on file  Social Connections: Not on file  Intimate Partner Violence: Not on file     Family History  Problem Relation Age of Onset   Cancer Mother    Cancer Father        unknown type   Hypertension Sister    Hypertension Brother    Hyperlipidemia Brother    Breast cancer Maternal Grandmother        likely breast ca - mastectomy   Hypertension Brother    Diabetes Brother    Hypertension Brother    Heart attack Neg Hx    Stroke Neg Hx    Colon cancer Neg Hx    Ovarian cancer Neg Hx      Current Outpatient Medications:    albuterol  (VENTOLIN  HFA) 108 (90 Base) MCG/ACT inhaler, Inhale 2 puffs into the lungs every 6 (six) hours as needed for wheezing or shortness of breath., Disp: 8 g, Rfl: 0   amLODipine  (NORVASC ) 10 MG tablet, TAKE 1 TABLET BY MOUTH EVERY DAY, Disp: 90 tablet, Rfl: 0   Physical exam: There were no vitals filed for this visit. Physical Exam Cardiovascular:     Rate and Rhythm: Normal rate and regular rhythm.     Heart sounds: Normal heart sounds.  Pulmonary:     Effort: Pulmonary effort is normal.     Breath sounds: Normal breath sounds.  Abdominal:     General: Bowel sounds are normal.     Palpations: Abdomen is soft.  Skin:    General: Skin is warm and dry.  Neurological:     Mental Status: She is alert and oriented to person, place, and time.    Breast exam: There is a palpable 3 cm mass in the left breast at the 6 o'clock position.  No other palpable masses in the left breast.  No palpable bilateral axillar adenopathy.  No palpable masses in the right breast       Latest Ref Rng & Units 04/15/2024    4:45 AM  CMP  Glucose 70 - 99 mg/dL 95   BUN 8 - 23 mg/dL 12   Creatinine 9.55 - 1.00 mg/dL 9.06   Sodium 864 - 854 mmol/L 141   Potassium 3.5 - 5.1 mmol/L 3.6   Chloride 98 - 111 mmol/L 105   CO2 22 - 32 mmol/L  29   Calcium 8.9 - 10.3 mg/dL 9.0       Latest Ref Rng & Units 04/15/2024    4:45 AM  CBC  WBC 4.0 - 10.5 K/uL 9.2   Hemoglobin 12.0 - 15.0 g/dL 88.3   Hematocrit 63.9 - 46.0 % 35.5   Platelets 150 - 400 K/uL 226     No images are attached to the encounter.  US  LT BREAST BX W LOC DEV 1ST LESION IMG BX SPEC US  GUIDE Addendum Date: 07/22/2024 ADDENDUM REPORT: 07/22/2024 12:20 ADDENDUM: PATHOLOGY revealed: Breast, left, needle core biopsy, 6 o'clock 5 cmfn (ribbon clip)- INVASIVE MAMMARY CARCINOMA- OVERALL GRADE: 2 LYMPHOVASCULAR INVASION: NOT IDENTIFIED, CANCER LENGTH: 14 MM /1.4 CM, CALCIFICATIONS: NOT IDENTIFIED. Pathology results are CONCORDANT with imaging findings, per Dr. Alm Parkins. Pathology results and recommendations were discussed with patient via telephone on 07/22/2024 by Rock Hover RN. Patient reported biopsy site doing well with no adverse symptoms, and slight tenderness at the site. Post biopsy care instructions were reviewed, questions were answered and my direct phone number was provided. Patient was instructed to call [facility] for any additional questions or concerns related to biopsy site. RECOMMENDATIONS: 1. Surgical and oncological consultation. Request for surgical and oncological consultation relayed to Shasta Ada RN at [facility] by Rock Hover RN on 07/22/2024. Pathology results reported by Mliss Molt RN 07/22/2024. Electronically Signed   By: Alm Parkins  M.D.   On: 07/22/2024 12:20   Result Date: 07/22/2024 CLINICAL DATA:  Patient presents for ultrasound-guided core needle biopsy of a left breast mass. EXAM: ULTRASOUND GUIDED LEFT BREAST CORE NEEDLE BIOPSY COMPARISON:  Previous exam(s). PROCEDURE: I met with the patient and we discussed the procedure of ultrasound-guided biopsy, including benefits and alternatives. We discussed the high likelihood of a successful procedure. We discussed the risks of the procedure, including infection, bleeding, tissue injury, clip migration, and inadequate sampling. Informed written consent was given. The usual time-out protocol was performed immediately prior to the procedure. Lesion quadrant: Upper outer quadrant: Near 6 o'clock, 5 cm from the nipple. Using sterile technique and 1% Lidocaine  as local anesthetic, under direct ultrasound visualization, a 12 gauge spring-loaded device was used to perform biopsy of the 3 cm mass at 6 o'clock using a medial approach. At the conclusion of the procedure a ribbon shaped tissue marker clip was deployed into the biopsy cavity. Follow up 2 view mammogram was performed and dictated separately. IMPRESSION: Ultrasound guided biopsy of a left breast mass. No apparent complications. Electronically Signed: By: Alm Parkins M.D. On: 07/21/2024 14:08   MM CLIP PLACEMENT LEFT Result Date: 07/21/2024 CLINICAL DATA:  Assess post biopsy marker clip placement following ultrasound-guided core needle biopsy of a left breast mass. EXAM: 3D DIAGNOSTIC LEFT MAMMOGRAM POST ULTRASOUND BIOPSY COMPARISON:  Previous exam(s). ACR Breast Density Category b: There are scattered areas of fibroglandular density. FINDINGS: 3D Mammographic images were obtained following ultrasound guided biopsy of a left breast mass. The biopsy marking clip is in expected position at the site of biopsy. IMPRESSION: Appropriate positioning of the ribbon shaped biopsy marking clip at the site of biopsy in the mass in the inferior  aspect of the left breast. Final Assessment: Post Procedure Mammograms for Marker Placement Electronically Signed   By: Alm Parkins M.D.   On: 07/21/2024 14:27   MM 3D DIAGNOSTIC MAMMOGRAM BILATERAL BREAST Result Date: 07/19/2024 CLINICAL DATA:  Patient presents with a palpable lump along the inferior aspect of the left breast.  EXAM: DIGITAL DIAGNOSTIC BILATERAL MAMMOGRAM WITH TOMOSYNTHESIS AND CAD; ULTRASOUND RIGHT BREAST LIMITED; ULTRASOUND LEFT BREAST LIMITED TECHNIQUE: Bilateral digital diagnostic mammography and breast tomosynthesis was performed. The images were evaluated with computer-aided detection. ; Targeted ultrasound examination of the right breast was performed; Targeted ultrasound examination of the left breast was performed. COMPARISON:  Previous exam(s). ACR Breast Density Category b: There are scattered areas of fibroglandular density. FINDINGS: Left breast: There is irregular mass along the inferior aspect of the left breast, posterior margin which is excluded from the mammographic field of view. This mass corresponds to the palpable mass. There are no other left breast masses and no suspicious calcifications. Right breast: There is a small, round, circumscribed mass in the medial breast at 2-3 o'clock measuring 6 mm in size. No other right breast masses, no areas of architectural distortion and no suspicious calcifications. On physical exam, there is a firm mass in the inferior aspect left breast near 6 o'clock that bulges the contour of the overlying skin. Targeted left breast ultrasound is performed, showing an irregular hypoechoic vascular mass in the 6 o'clock position of the left breast, middle to posterior depth, measuring 3.0 x 2.4 x 2.9 cm. This corresponds to the palpable mass in the mammographic mass. Sonographic imaging left axilla demonstrates normal lymph nodes. No abnormal nodes. Targeted right breast ultrasound is performed, showing a simple oval cyst at 2:30 o'clock, 6 cm  the nipple, measuring 6 x 3 x 5 mm, consistent in size, shape and location to the mammographic mass. No solid mass or suspicious finding. IMPRESSION: 1. 3 cm highly suspicious mass in the left breast axis o'clock corresponding to the palpable lump. No left axillary lymphadenopathy. 2. Small benign right breast cyst. No evidence of right breast malignancy. RECOMMENDATION: 1. Ultrasound-guided core needle biopsy of the left breast mass. This procedure was scheduled prior to the patient leaving the [facility] center. I have discussed the findings and recommendations with the patient. If applicable, a reminder letter will be sent to the patient regarding the next appointment. BI-RADS CATEGORY  5: Highly suggestive of malignancy. Electronically Signed   By: Alm Parkins M.D.   On: 07/19/2024 10:53   US  LIMITED ULTRASOUND INCLUDING AXILLA LEFT BREAST  Result Date: 07/19/2024 CLINICAL DATA:  Patient presents with a palpable lump along the inferior aspect of the left breast. EXAM: DIGITAL DIAGNOSTIC BILATERAL MAMMOGRAM WITH TOMOSYNTHESIS AND CAD; ULTRASOUND RIGHT BREAST LIMITED; ULTRASOUND LEFT BREAST LIMITED TECHNIQUE: Bilateral digital diagnostic mammography and breast tomosynthesis was performed. The images were evaluated with computer-aided detection. ; Targeted ultrasound examination of the right breast was performed; Targeted ultrasound examination of the left breast was performed. COMPARISON:  Previous exam(s). ACR Breast Density Category b: There are scattered areas of fibroglandular density. FINDINGS: Left breast: There is irregular mass along the inferior aspect of the left breast, posterior margin which is excluded from the mammographic field of view. This mass corresponds to the palpable mass. There are no other left breast masses and no suspicious calcifications. Right breast: There is a small, round, circumscribed mass in the medial breast at 2-3 o'clock measuring 6 mm in size. No other right  breast masses, no areas of architectural distortion and no suspicious calcifications. On physical exam, there is a firm mass in the inferior aspect left breast near 6 o'clock that bulges the contour of the overlying skin. Targeted left breast ultrasound is performed, showing an irregular hypoechoic vascular mass in the 6 o'clock position of the left breast, middle  to posterior depth, measuring 3.0 x 2.4 x 2.9 cm. This corresponds to the palpable mass in the mammographic mass. Sonographic imaging left axilla demonstrates normal lymph nodes. No abnormal nodes. Targeted right breast ultrasound is performed, showing a simple oval cyst at 2:30 o'clock, 6 cm the nipple, measuring 6 x 3 x 5 mm, consistent in size, shape and location to the mammographic mass. No solid mass or suspicious finding. IMPRESSION: 1. 3 cm highly suspicious mass in the left breast axis o'clock corresponding to the palpable lump. No left axillary lymphadenopathy. 2. Small benign right breast cyst. No evidence of right breast malignancy. RECOMMENDATION: 1. Ultrasound-guided core needle biopsy of the left breast mass. This procedure was scheduled prior to the patient leaving the [facility] center. I have discussed the findings and recommendations with the patient. If applicable, a reminder letter will be sent to the patient regarding the next appointment. BI-RADS CATEGORY  5: Highly suggestive of malignancy. Electronically Signed   By: Alm Parkins M.D.   On: 07/19/2024 10:53   US  LIMITED ULTRASOUND INCLUDING AXILLA RIGHT BREAST Result Date: 07/19/2024 CLINICAL DATA:  Patient presents with a palpable lump along the inferior aspect of the left breast. EXAM: DIGITAL DIAGNOSTIC BILATERAL MAMMOGRAM WITH TOMOSYNTHESIS AND CAD; ULTRASOUND RIGHT BREAST LIMITED; ULTRASOUND LEFT BREAST LIMITED TECHNIQUE: Bilateral digital diagnostic mammography and breast tomosynthesis was performed. The images were evaluated with computer-aided detection. ;  Targeted ultrasound examination of the right breast was performed; Targeted ultrasound examination of the left breast was performed. COMPARISON:  Previous exam(s). ACR Breast Density Category b: There are scattered areas of fibroglandular density. FINDINGS: Left breast: There is irregular mass along the inferior aspect of the left breast, posterior margin which is excluded from the mammographic field of view. This mass corresponds to the palpable mass. There are no other left breast masses and no suspicious calcifications. Right breast: There is a small, round, circumscribed mass in the medial breast at 2-3 o'clock measuring 6 mm in size. No other right breast masses, no areas of architectural distortion and no suspicious calcifications. On physical exam, there is a firm mass in the inferior aspect left breast near 6 o'clock that bulges the contour of the overlying skin. Targeted left breast ultrasound is performed, showing an irregular hypoechoic vascular mass in the 6 o'clock position of the left breast, middle to posterior depth, measuring 3.0 x 2.4 x 2.9 cm. This corresponds to the palpable mass in the mammographic mass. Sonographic imaging left axilla demonstrates normal lymph nodes. No abnormal nodes. Targeted right breast ultrasound is performed, showing a simple oval cyst at 2:30 o'clock, 6 cm the nipple, measuring 6 x 3 x 5 mm, consistent in size, shape and location to the mammographic mass. No solid mass or suspicious finding. IMPRESSION: 1. 3 cm highly suspicious mass in the left breast axis o'clock corresponding to the palpable lump. No left axillary lymphadenopathy. 2. Small benign right breast cyst. No evidence of right breast malignancy. RECOMMENDATION: 1. Ultrasound-guided core needle biopsy of the left breast mass. This procedure was scheduled prior to the patient leaving the [facility] center. I have discussed the findings and recommendations with the patient. If applicable, a reminder  letter will be sent to the patient regarding the next appointment. BI-RADS CATEGORY  5: Highly suggestive of malignancy. Electronically Signed   By: Alm Parkins M.D.   On: 07/19/2024 10:53    Assessment and plan- Patient is a 63 y.o. female with history of newly diagnosed clinical  prognostic stage IIb invasive mammary carcinoma of the left breast cT2 N0 M0 ER/PR negative HER2 negative here to discuss further management  Assessment and Plan    Triple negative breast cancer, left breast, stage II - I have discussed mammogram and ultrasound as well as pathology results with the patient in detail Stage II triple negative breast cancer, left breast, 3 x 2.4 x 2.9 cm. Aggressive subtype not responsive to hormone therapy. - Initiate neoadjuvant chemotherapy with doxorubicin, Cytoxan, and Keytruda per Keynote 522 trial: every three weeks for four cycles, then weekly for twelve weeks. -In the randomized KEYNOTE-522 trial, [id number] patients with previously untreated stage II and III TNBC received weekly paclitaxel with carboplatin, followed by [facility] or [facility] (the schedule for weekly versus every-three-week carboplatin and the choice of anthracycline were at physician discretion). The patients were randomly assigned to receive pembrolizumab versus placebo concurrent with their NACT. After surgery, patients resumed their assigned treatment (pembrolizumab versus placebo) for another nine cycles (27 weeks). The addition of pembrolizumab resulted in the following:  ?An increase in the pathologic complete response (pCR) rate, 63 versus 56 percent. pCR rates increased in both patients with programmed death-ligand 1 (PD-L1)-positive (69 versus 55 percent) and PD-L1-negative (45 versus 30 percent) cancers, as well as in patients who were clinically node-positive (65 versus 44 percent) or node-negative (65 versus 59 percent) at baseline.  ?Improvement in 77-month event-free survival (EFS), 85 versus 77 percent (hazard ratio [HR]  0.63, 95% CI 0.48-0.82)    - I discussed risks and benefits of chemotherapy including all but not limited to nausea vomiting low blood counts risk of infections and hospitalization.  Risk of cardiotoxicity associated with doxorubicin.  Treatment will be given with a curative intent.  Patient understands and agrees to proceed as planned.  Also discussed and benefits of Keytruda including all but not limited to autoimmune side effect such as pneumonitis colitis and endocrinopathies - Order echocardiogram for doxorubicin cardiac effects. - Place port for chemotherapy with Dr. Jordis. - Order bilateral breast MRI. - Order PET scan for metastasis assessment. - Conduct BRCA genetic testing due to family history. - following neoadjuvant chemotherapy she will proceed with lumpectomy/ SLNB and adjuvant RT. If she has pcr she will continue keytruda for 9 cycles. If no pcr- consideration for adjuvant xeloda.   Goals of Care She is a [employer]; chemotherapy regimen aligns with her beliefs as it usually  does not require blood transfusions.       Cancer Staging  Malignant neoplasm of lower-outer quadrant of left breast of female, estrogen receptor negative (HCC) Staging form: Breast, AJCC 8th Edition - Clinical stage from 07/27/2024: Stage IIB (cT2, cN0, cM0, G2, ER-, PR-, HER2-) - Signed by Melanee Annah BROCKS, MD on 07/27/2024 Stage prefix: Initial diagnosis Histologic grading system: 3 grade system   Thank you for this kind referral and the opportunity to participate in the care of this patient   Visit Diagnosis 1. Malignant neoplasm of lower-outer quadrant of left breast of female, estrogen receptor negative (HCC)   2. Goals of care, counseling/discussion     Dr. Annah Melanee, MD, MPH [facility] [phone] 07/27/2024

## 2024-07-27 NOTE — Progress Notes (Signed)
 Accompanied patient and family to initial medical oncology appointment.   Reviewed Breast Cancer treatment handbook.   Care plan summary given to patient.   Reviewed outreach programs and cancer center services.

## 2024-07-28 ENCOUNTER — Telehealth: Payer: Self-pay

## 2024-07-28 ENCOUNTER — Encounter: Payer: Self-pay | Admitting: Occupational Therapy

## 2024-07-28 ENCOUNTER — Other Ambulatory Visit: Payer: Self-pay

## 2024-07-28 ENCOUNTER — Inpatient Hospital Stay: Admitting: Occupational Therapy

## 2024-07-28 DIAGNOSIS — Z171 Estrogen receptor negative status [ER-]: Secondary | ICD-10-CM

## 2024-07-28 DIAGNOSIS — R293 Abnormal posture: Secondary | ICD-10-CM

## 2024-07-28 DIAGNOSIS — C50512 Malignant neoplasm of lower-outer quadrant of left female breast: Secondary | ICD-10-CM | POA: Diagnosis not present

## 2024-07-28 NOTE — Addendum Note (Signed)
 Addended by: ANCEL PETERS on: 07/28/2024 03:15 PM   Modules accepted: Orders

## 2024-07-28 NOTE — Telephone Encounter (Signed)
 [id number]: SHORTER ANTHRACYCLINE-FREE CHEMO IMMUNOTHERAPY ADAPTED TO PATHOLOGICAL RESPONSE IN EARLY TRIPLE NEGATIVE BREAST CANCER (SCARLET), A RANDOMIZED PHASE III STUDY   Dr. Melanee request that research nurse contact patient to see if she would be interested in participating in this clinical trial possibly. Research nurse contacted patient via telephone and spoke with her today. Nurse reviewed the protocol very briefly with the patient. She states she does want to participate in the clinical trial if possible and wants to review the protocol with Dr. Melanee. Dr. Melanee notified of patients request, patient will be scheduled for a return visit with her in the next week.  Reena Romans, RN 07/28/24 10:42 AM

## 2024-07-28 NOTE — Therapy (Signed)
 OUTPATIENT OCCUPATIONAL THERAPY BREAST CANCER BASELINE EVALUATION   Patient Name: Amber Figueroa MRN: [id number] DOB:1961/01/10, 63 y.o., female Today's Date: 07/28/2024  END OF SESSION:  OT End of Session - 07/28/24 1325     Visit Number 1    Number of Visits 6    Date for Recertification  11/27/24    OT Start Time 0900    OT Stop Time 0928    OT Time Calculation (min) 28 min    Activity Tolerance Patient tolerated treatment well    Behavior During Therapy [facility] for tasks assessed/performed          Past Medical History:  Diagnosis Date   Back pain    Hypertension    Past Surgical History:  Procedure Laterality Date   BREAST BIOPSY Left 07/21/2024   US  LT BREAST BX W LOC DEV 1ST LESION IMG BX SPEC US  GUIDE 07/21/2024 [facility]   COLONOSCOPY WITH PROPOFOL  N/A 07/21/2017   Procedure: COLONOSCOPY WITH PROPOFOL ;  Surgeon: Therisa Bi, MD;  Location: [facility] ENDOSCOPY;  Service: Gastroenterology;  Laterality: N/A;   KNEE SURGERY     Patient Active Problem List   Diagnosis Date Noted   Malignant neoplasm of lower-outer quadrant of left breast of female, estrogen receptor negative (HCC) 07/27/2024   Overweight with body mass index (BMI) of 26 to 26.9 in adult 02/06/2024   Pure hypercholesterolemia 08/05/2022   Primary hypertension 01/16/2022   Normocytic anemia 12/05/2021   Lymphedema 05/22/2021    PCP: Antonette NP  REFERRING PROVIDER: Dr Babara  REFERRING DIAG: L breast Cancer  THERAPY DIAG:  Abnormal posture  Rationale for Evaluation and Treatment: Rehabilitation  ONSET DATE: 07/19/24  SUBJECTIVE:                                                                                                                                                                                           SUBJECTIVE STATEMENT: Patient reports she is here today after being refer by one of her medical team for her newly diagnosed left breast cancer.   PERTINENT HISTORY:   Assessment and  plan- Patient is a 63 y.o. female with history of newly diagnosed clinical prognostic stage IIb invasive mammary carcinoma of the left breast cT2 N0 M0 ER/PR negative HER2 negative here to discuss further management   Assessment and Plan    Triple negative breast cancer, left breast, stage II - I have discussed mammogram and ultrasound as well as pathology results with the patient in detail Stage II triple negative breast cancer, left breast, 3 x 2.4 x 2.9 cm. Aggressive subtype not responsive to hormone therapy. - Initiate neoadjuvant chemotherapy with  doxorubicin, Cytoxan, and Keytruda per Keynote 522 trial: every three weeks for four cycles, then weekly for twelve weeks. -In the randomized KEYNOTE-522 trial, 1174 patients with previously untreated stage II and III TNBC received weekly paclitaxel with carboplatin, followed by [facility] or [facility] (the schedule for weekly versus every-three-week carboplatin and the choice of anthracycline were at physician discretion). The patients were randomly assigned to receive pembrolizumab versus placebo concurrent with their NACT. After surgery, patients resumed their assigned treatment (pembrolizumab versus placebo) for another nine cycles (27 weeks). The addition of pembrolizumab resulted in the following:   ?An increase in the pathologic complete response (pCR) rate, 63 versus 56 percent. pCR rates increased in both patients with programmed death-ligand 1 (PD-L1)-positive (69 versus 55 percent) and PD-L1-negative (45 versus 30 percent) cancers, as well as in patients who were clinically node-positive (65 versus 44 percent) or node-negative (65 versus 59 percent) at baseline.   ?Improvement in 36-month event-free survival (EFS), 85 versus 77 percent (hazard ratio [HR] 0.63, 95% CI 0.48-0.82)      - I discussed risks and benefits of chemotherapy including all but not limited to nausea vomiting low blood counts risk of infections and hospitalization.  Risk of  cardiotoxicity associated with doxorubicin.  Treatment will be given with a curative intent.  Patient understands and agrees to proceed as planned.  Also discussed and benefits of Keytruda including all but not limited to autoimmune side effect such as pneumonitis colitis and endocrinopathies - Order echocardiogram for doxorubicin cardiac effects. - Place port for chemotherapy with Dr. Jordis. - Order bilateral breast MRI. - Order PET scan for metastasis assessment. - Conduct BRCA genetic testing due to family history. - following neoadjuvant chemotherapy she will proceed with lumpectomy/ SLNB and adjuvant RT. If she has pcr she will continue keytruda for 9 cycles. If no pcr- consideration for adjuvant xeloda.    Goals of Care She is a [employer]; chemotherapy regimen aligns with her beliefs as it usually  does not require blood transfusions.      PATIENT GOALS:   reduce lymphedema risk and learn post op HEP.  Patient has bilateral lower extremity lymphedema since age 72 per patient  PAIN:  Are you having pain? No  PRECAUTIONS: Active CA ,left upper extremity lymphedema at risk     HAND DOMINANCE: right  WEIGHT BEARING RESTRICTIONS: No  FALLS:  Has patient fallen in last 6 months? No  LIVING ENVIRONMENT: Patient lives with: Son and granddaughter lives with her  OCCUPATION: Patient works full-time in scientist, water quality.  Doing lifting pulling pushing carrying up to 50 pounds  LEISURE: Mostly doing activities around the house for free time   OBJECTIVE:  COGNITION: Overall cognitive status: Within functional limits for tasks assessed    POSTURE:  rounded shoulders posture  UPPER EXTREMITY AROM/PROM: Preassessment bilateral shoulder flexion and abduction external rotation within normal range  CERVICAL AROM: All within normal limits:     UPPER EXTREMITY STRENGTH: 5/5 for bilateral shoulder strength in all range  LYMPHEDEMA ASSESSMENTS:    LYMPHEDEMA/ONCOLOGY  QUESTIONNAIRE - 07/28/24 0001       Right Upper Extremity Lymphedema   15 cm Proximal to Olecranon Process 28.4 cm    10 cm Proximal to Olecranon Process 27.8 cm    Olecranon Process 26 cm      Left Upper Extremity Lymphedema   15 cm Proximal to Olecranon Process 27.5 cm    10 cm Proximal to Olecranon Process 27 cm    Olecranon Process  25.2 cm           L-DEX LYMPHEDEMA SCREENING:  The patient was assessed using the L-Dex machine today to produce a lymphedema index baseline score. The patient will be reassessed on a regular basis (typically every 3 months) to obtain new L-Dex scores. If the score is > 6.5 points away from his/her baseline score indicating onset of subclinical lymphedema, it will be recommended to wear a compression garment for 4 weeks, 12 hours per day and then be reassessed. If the score continues to be > 6.5 points from baseline at reassessment, we will initiate lymphedema treatment. Assessing in this manner has a 95% rate of preventing clinically significant lymphedema.   L-DEX FLOWSHEETS - 07/28/24 1300       L-DEX LYMPHEDEMA SCREENING   Measurement Type Unilateral    L-DEX MEASUREMENT EXTREMITY Upper Extremity    POSITION  Standing    DOMINANT SIDE Right    At Risk Side Left    BASELINE SCORE (UNILATERAL) -3           PATIENT EDUCATION:  Education details: Lymphedema info but will review after surgery or treatment and post op shoulder/posture HEP Person educated: Patient Education method: Explanation, Demonstration, Handout Education comprehension: Patient verbalized understanding and returned demonstration  HOME EXERCISE PROGRAM: Reviewed with patient home program if or when she has a mastectomy or lumpectomy.  Active assisted range of motion in supine for shoulder flexion and abduction using a wand and external rotation using gravity.  Up to 90 degrees pain-free and then increase overhead. To 3 times a day 10 reps.  Handout  provided. ASSESSMENT:  CLINICAL IMPRESSION: Her multidisciplinary medical team has met to assess and determine a recommended treatment plan. She is planning to have chemo first starting 07/06/2024.  Did review with patient home program for active assisted range of motion for shoulder flexion abduction and external rotation.  To be performed in the future if she has surgery lumpectomy or mastectomy.  Handout provided and reviewed with patient.  Patient has bilateral lower extremity lymphedema since age 49.  Review some information but will review again in the future if having surgery.  Baseline L-Dex score was done Pt can  benefit from a post op OT reassessment to determine needs and from L-Dex screens every 3 months for 2 years to detect subclinical lymphedema.  Pt will benefit from skilled therapeutic intervention to improve on the following deficits: Decreased knowledge of precautions and lymphedema education, impaired UE functional use, pain, decreased ROM, postural dysfunction.   OT treatment/interventions: ADL/self-care home management, pt/family education, therapeutic exercise,manual therapy  REHAB POTENTIAL: Good  CLINICAL DECISION MAKING: Stable/uncomplicated  EVALUATION COMPLEXITY: Low   GOALS: Goals reviewed with patient? YES  LONG TERM GOALS: (STG=LTG)    Name Target Date Goal status  1 Pt will be able to verbalize understanding of pertinent lymphedema risk reduction practices relevant to her dx specifically related to skin care.  Baseline:  No knowledge 4 months Initial  2 Pt will be able to return demo and/or verbalize understanding of the post op HEP related to regaining shoulder ROM. Baseline:  No knowledge Today Achieved at eval       4 Pt will demo she has regained full shoulder ROM and function post operatively compared to baselines.  Baseline: See objective measurements taken today. 4 months Initial    PLAN:  OT FREQUENCY/DURATION: EVAL and  5 visits- 4  months  PLAN FOR NEXT SESSION: will reassess 3-4 weeks post op  to determine needs.   Occupational Therapy Information for After Breast Cancer Surgery/Treatment:  Lymphedema is a swelling condition that you may be at risk for in your arm if you have lymph nodes removed from the armpit area.  After a sentinel node biopsy, the risk is approximately 5-9% and is higher after an axillary node dissection.  There is treatment available for this condition and it is not life-threatening.  Contact your physician or occupational therapist with concerns. You may begin the 4 shoulder/posture exercises (see additional sheet) when permitted by your physician (typically a week after surgery).  If you have drains, you may need to wait until those are removed before beginning range of motion exercises.  A general recommendation is to not lift your arms above shoulder height until drains are removed.  These exercises should be done to your tolerance and gently.  This is not a no pain/no gain type of recovery so listen to your body and stretch into the range of motion that you can tolerate, stopping if you have pain.  If you are having immediate reconstruction, ask your plastic surgeon about doing exercises as he or she may want you to wait. .  While undergoing any medical procedure or treatment, try to avoid blood pressure being taken or needle sticks from occurring on the arm on the side of cancer.   This recommendation begins after surgery and continues for the rest of your life.  This may help reduce your risk of getting lymphedema (swelling in your arm). An excellent resource for those seeking information on lymphedema is the National Lymphedema Network's web site. It can be accessed at www.lymphnet.org If you notice swelling in your hand, arm or breast at any time following surgery (even if it is many years from now), please contact your doctor or occupational therapist to discuss this.  Lymphedema can be treated at  any time but it is easier for you if it is treated early on.  If you feel like your shoulder motion is not returning to normal in a reasonable amount of time, please contact your surgeon or occupational therapist.  [facility] [phone]. [address], [place], [place] [address]       Ancel Peters, OTR/L,CLT 07/28/2024, 1:28 PM

## 2024-07-29 ENCOUNTER — Ambulatory Visit
Admission: RE | Admit: 2024-07-29 | Discharge: 2024-07-29 | Disposition: A | Discharge status: Home / Self Care | Source: Ambulatory Visit | Attending: Oncology | Admitting: Oncology

## 2024-07-29 ENCOUNTER — Encounter: Payer: Self-pay | Admitting: Oncology

## 2024-07-29 DIAGNOSIS — I340 Nonrheumatic mitral (valve) insufficiency: Secondary | ICD-10-CM | POA: Insufficient documentation

## 2024-07-29 DIAGNOSIS — Z0189 Encounter for other specified special examinations: Secondary | ICD-10-CM | POA: Diagnosis not present

## 2024-07-29 DIAGNOSIS — Z171 Estrogen receptor negative status [ER-]: Secondary | ICD-10-CM

## 2024-07-29 DIAGNOSIS — I358 Other nonrheumatic aortic valve disorders: Secondary | ICD-10-CM | POA: Diagnosis not present

## 2024-07-29 DIAGNOSIS — I119 Hypertensive heart disease without heart failure: Secondary | ICD-10-CM | POA: Diagnosis present

## 2024-07-29 DIAGNOSIS — C50512 Malignant neoplasm of lower-outer quadrant of left female breast: Secondary | ICD-10-CM | POA: Diagnosis not present

## 2024-07-29 DIAGNOSIS — I10 Essential (primary) hypertension: Secondary | ICD-10-CM | POA: Diagnosis not present

## 2024-07-29 LAB — ECHOCARDIOGRAM COMPLETE
AR max vel: 3.14 cm2
AV Area VTI: 3.2 cm2
AV Area mean vel: 3.29 cm2
AV Mean grad: 3 mmHg
AV Peak grad: 5 mmHg
Ao pk vel: 1.12 m/s
Area-P 1/2: 3.26 cm2
MV VTI: 3.3 cm2
S' Lateral: 2.6 cm

## 2024-07-29 NOTE — Progress Notes (Signed)
*  PRELIMINARY RESULTS* Echocardiogram 2D Echocardiogram has been performed.  Floydene Harder 07/29/2024, 10:45 AM

## 2024-07-30 ENCOUNTER — Encounter
Admission: RE | Admit: 2024-07-30 | Discharge: 2024-07-30 | Disposition: A | Discharge status: Home / Self Care | Source: Ambulatory Visit | Attending: Surgery | Admitting: Surgery

## 2024-07-30 ENCOUNTER — Other Ambulatory Visit: Payer: Self-pay

## 2024-07-30 HISTORY — DX: Gastro-esophageal reflux disease without esophagitis: K21.9

## 2024-07-30 HISTORY — DX: Other complications of anesthesia, initial encounter: T88.59XA

## 2024-07-30 NOTE — Patient Instructions (Addendum)
 Your procedure is scheduled on:08/03/24 - Tuesday Report to the Registration Desk on the 1st floor of the [facility]. To find out your arrival time, please call [phone] between 1PM - 3PM on: 08/02/24 - Monday If your arrival time is 6:00 am, do not arrive before that time as the [facility] entrance doors do not open until 6:00 am.  REMEMBER: Instructions that are not followed completely may result in serious medical risk, up to and including death; or upon the discretion of your surgeon and anesthesiologist your surgery may need to be rescheduled.  Do not eat food after midnight the night before surgery.  No gum chewing or hard candies.  You may however, drink CLEAR liquids up to 2 hours before you are scheduled to arrive for your surgery. Do not drink anything within 2 hours of your scheduled arrival time.  Clear liquids include: - water  - apple juice without pulp - gatorade (not RED colors) - black coffee or tea (Do NOT add milk or creamers to the coffee or tea) Do NOT drink anything that is not on this list.   One week prior to surgery: Stop Anti-inflammatories (NSAIDS) such as Alka-Seltzer, Advil , Aleve, Ibuprofen , Motrin , Naproxen, Naprosyn and Aspirin based products such as Excedrin, Goody's Powder, BC Powder. You may take Tylenol  if needed for pain up until the day of surgery.  Stop ANY OVER THE COUNTER supplements until after surgery.  Continue taking all of your other prescription medications up until the day of surgery.  ON THE DAY OF SURGERY ONLY TAKE THESE MEDICATIONS WITH SIPS OF WATER:  amLODipine  (NORVASC )     No Alcohol for 24 hours before or after surgery.  No Smoking including e-cigarettes for 24 hours before surgery.  No chewable tobacco products for at least 6 hours before surgery.  No nicotine patches on the day of surgery.  Do not use any recreational drugs for at least a week (preferably 2 weeks) before your surgery.  Please be advised  that the combination of cocaine and anesthesia may have negative outcomes, up to and including death. If you test positive for cocaine, your surgery will be cancelled.  On the morning of surgery brush your teeth with toothpaste and water, you may rinse your mouth with mouthwash if you wish. Do not swallow any toothpaste or mouthwash.  Do not wear jewelry, make-up, hairpins, clips or nail polish.  For welded (permanent) jewelry: bracelets, anklets, waist bands, etc.  Please have this removed prior to surgery.  If it is not removed, there is a chance that hospital personnel will need to cut it off on the day of surgery.  Do not wear lotions, powders, or perfumes.   Do not shave body hair from the neck down 48 hours before surgery.  Contact lenses, hearing aids and dentures may not be worn into surgery.  Do not bring valuables to the hospital. [facility] is not responsible for any missing/lost belongings or valuables.   Notify your doctor if there is any change in your medical condition (cold, fever, infection).  Wear comfortable clothing (specific to your surgery type) to the hospital.  After surgery, you can help prevent lung complications by doing breathing exercises.  Take deep breaths and cough every 1-2 hours. Your doctor may order a device called an Incentive Spirometer to help you take deep breaths.  If you are being admitted to the hospital overnight, leave your suitcase in the car. After surgery it may be brought to your  room.  In case of increased patient census, it may be necessary for you, the patient, to continue your postoperative care in the Same Day Surgery department.  If you are being discharged the day of surgery, you will not be allowed to drive home. You will need a responsible individual to drive you home and stay with you for 24 hours after surgery.   If you are taking public transportation, you will need to have a responsible individual with you.  Please call  the Pre-admissions Testing Dept. at [phone] if you have any questions about these instructions.  Surgery Visitation Policy:  Patients having surgery or a procedure may have two visitors.  Children under the age of 12 must have an adult with them who is not the patient.  Inpatient Visitation:    Visiting hours are 7 a.m. to 8 p.m. Up to four visitors are allowed at one time in a patient room. The visitors may rotate out with other people during the day.  One visitor age 64 or older may stay with the patient overnight and must be in the room by 8 p.m.   Merchandiser, Retail to address health-related social needs:  [email or web address]                                                                                                            Preparing for Surgery with CHLORHEXIDINE GLUCONATE (CHG) Soap  Chlorhexidine Gluconate (CHG) Soap  o An antiseptic cleaner that kills germs and bonds with the skin to continue killing germs even after washing  o Used for showering the night before surgery and morning of surgery  Before surgery, you can play an important role by reducing the number of germs on your skin.  CHG (Chlorhexidine gluconate) soap is an antiseptic cleanser which kills germs and bonds with the skin to continue killing germs even after washing.  Please do not use if you have an allergy to CHG or antibacterial soaps. If your skin becomes reddened/irritated stop using the CHG.  1. Shower the NIGHT BEFORE SURGERY with CHG soap.  2. If you choose to wash your hair, wash your hair first as usual with your normal shampoo.  3. After shampooing, rinse your hair and body thoroughly to remove the shampoo.  4. Use CHG as you would any other liquid soap. You can apply CHG directly to the skin and wash gently with a clean washcloth.  5. Apply the CHG soap to your body only from the neck down. Do not use on open wounds or open sores. Avoid contact with your  eyes, ears, mouth, and genitals (private parts). Wash face and genitals (private parts) with your normal soap.  6. Wash thoroughly, paying special attention to the area where your surgery will be performed.  7. Thoroughly rinse your body with warm water.  8. Do not shower/wash with your normal soap after using and rinsing off the CHG soap.  9. Do not use lotions, oils, etc., after showering with CHG.  10. Pat yourself dry with a  clean towel.  11. Wear clean pajamas to bed the night before surgery.  12. Place clean sheets on your bed the night of your shower and do not sleep with pets.  13. Do not apply any deodorants/lotions/powders.  14. Please wear clean clothes to the hospital.  15. Remember to brush your teeth with your regular toothpaste.

## 2024-08-02 ENCOUNTER — Ambulatory Visit: Admitting: Surgery

## 2024-08-02 ENCOUNTER — Encounter: Payer: Self-pay | Admitting: Surgery

## 2024-08-02 VITALS — BP 104/67 | HR 76 | Ht 71.0 in | Wt 187.0 lb

## 2024-08-02 DIAGNOSIS — Z171 Estrogen receptor negative status [ER-]: Secondary | ICD-10-CM | POA: Diagnosis not present

## 2024-08-02 DIAGNOSIS — C50512 Malignant neoplasm of lower-outer quadrant of left female breast: Secondary | ICD-10-CM | POA: Diagnosis not present

## 2024-08-02 MED ORDER — LACTATED RINGERS IV SOLN: INTRAVENOUS | Status: DC

## 2024-08-02 MED ORDER — CHLORHEXIDINE GLUCONATE CLOTH 2 % EX PADS
6.0000 | MEDICATED_PAD | Freq: Once | CUTANEOUS | Status: AC
  Administered 2024-08-03: 6 via TOPICAL

## 2024-08-02 MED ORDER — GABAPENTIN 300 MG PO CAPS
300.0000 mg | ORAL_CAPSULE | ORAL | Status: AC
  Administered 2024-08-03: 300 mg via ORAL

## 2024-08-02 MED ORDER — CEFAZOLIN SODIUM-DEXTROSE 2-4 GM/100ML-% IV SOLN
2.0000 g | INTRAVENOUS | Status: AC
  Administered 2024-08-03: 2 g via INTRAVENOUS

## 2024-08-02 MED ORDER — ORAL CARE MOUTH RINSE: 15.0000 mL | Freq: Once | OROMUCOSAL | Status: AC

## 2024-08-02 MED ORDER — CHLORHEXIDINE GLUCONATE CLOTH 2 % EX PADS: 6.0000 | MEDICATED_PAD | Freq: Once | CUTANEOUS | Status: DC

## 2024-08-02 MED ORDER — CHLORHEXIDINE GLUCONATE 0.12 % MT SOLN
15.0000 mL | Freq: Once | OROMUCOSAL | Status: AC
  Administered 2024-08-03: 15 mL via OROMUCOSAL

## 2024-08-02 MED ORDER — ACETAMINOPHEN 500 MG PO TABS
1000.0000 mg | ORAL_TABLET | ORAL | Status: AC
  Administered 2024-08-03: 1000 mg via ORAL

## 2024-08-02 MED ORDER — CELECOXIB 200 MG PO CAPS
200.0000 mg | ORAL_CAPSULE | ORAL | Status: AC
  Administered 2024-08-03: 200 mg via ORAL

## 2024-08-02 NOTE — H&P (View-Only) (Signed)
 Patient ID: Amber Figueroa, female   DOB: 1961/05/14, 63 y.o.   MRN: [id number]  HPI Amber Figueroa is a 63 y.o. female  patient is a 63 year old female with a past medical history significant for hypertension who underwent a bilateral diagnostic mammogram on 07/19/2024 for a palpable left breast lump..  Mammogram showed a 3 cm highly suspicious mass in the left breast. Please note that I have personally reviewed images.  No left axillary adenopathy.  Small benign right breast cysts.  No evidence of right breast malignancy.  Patient underwent core biopsy of the left breast mass which was consistent with invasive mammary carcinoma grade 2 1.4 cm ER 0% negative, PR 0% negative, Ki-67 25% and HER2 negative +1   Patient lives with her son and grandson.  She works at [employer] and is independent of her ADLs and IADLs.  Menarche at 57.  G2 P2.   Age at first birth 44.  She used birth control pills in the past remotely.  Maternal grandmother with  history of breast cancer at age 22.  She is a [employer]  Smokes daily about 1ppd MRI pers reviewed showing large mass left breast , showing no invasion of the pect but in close proximity   HPI  Past Medical History:  Diagnosis Date   Back pain    Complication of anesthesia    slow to wake up with the knee surgery   GERD (gastroesophageal reflux disease)    Hypertension    Malignant neoplasm of lower-outer quadrant of left breast of female, estrogen receptor negative (HCC) 06/2024    Past Surgical History:  Procedure Laterality Date   BREAST BIOPSY Left 07/21/2024   US  LT BREAST BX W LOC DEV 1ST LESION IMG BX SPEC US  GUIDE 07/21/2024 [facility]   COLONOSCOPY WITH PROPOFOL  N/A 07/21/2017   Procedure: COLONOSCOPY WITH PROPOFOL ;  Surgeon: Therisa Bi, MD;  Location: [facility] END[facility]COPY;  Service: Gastroenterology;  Laterality: N/A;   KNEE SURGERY Left    arthroscopic    Family History  Problem Relation Age of Onset   Cancer Father         unknown type   Hypertension Sister    Hypertension Brother    Hyperlipidemia Brother    Hypertension Brother    Diabetes Brother    Hypertension Brother    Breast cancer Maternal Grandmother        likely breast ca - mastectomy   Heart attack Neg Hx    Stroke Neg Hx    Colon cancer Neg Hx    Ovarian cancer Neg Hx     Social History Social History   Tobacco Use   Smoking status: Every Day    Current packs/day: 1.00    Average packs/day: 1 pack/day for 1 year (1.0 ttl pk-yrs)    Types: Cigarettes    Passive exposure: Past   Smokeless tobacco: Never   Tobacco comments:    age [age] smoked 1/2 ppd, quit x 20 years, now smoking 1 year  Vaping Use   Vaping status: Never Used  Substance Use Topics   Alcohol use: No   Drug use: No    No Known Allergies  Current Outpatient Medications  Medication Sig Dispense Refill   albuterol  (VENTOLIN  HFA) 108 (90 Base) MCG/ACT inhaler Inhale into the lungs every 6 (six) hours as needed for wheezing or shortness of breath.     amLODipine  (NORVASC ) 10 MG tablet TAKE 1 TABLET BY MOUTH EVERY DAY 90 tablet 0  aspirin-sod bicarb-citric acid (ALKA-SELTZER) 325 MG TBEF tablet Take 325 mg by mouth every 6 (six) hours as needed.     dexamethasone (DECADRON) 4 MG tablet Take 2 tablets daily for 2 days, start the day after chemotherapy. Take with food. 30 tablet 1   lidocaine -prilocaine (EMLA) cream Apply to affected area once 30 g 3   ondansetron  (ZOFRAN ) 8 MG tablet Take 1 tablet (8 mg total) by mouth every 8 (eight) hours as needed for nausea or vomiting. Start on the third day after chemotherapy. 30 tablet 1   prochlorperazine (COMPAZINE) 10 MG tablet Take 1 tablet (10 mg total) by mouth every 6 (six) hours as needed for nausea or vomiting. 30 tablet 1   No current facility-administered medications for this visit.     Review of Systems Full ROS  was asked and was negative except for the information on the HPI  Physical Exam Blood pressure  104/67, pulse 76, height 5' 11 (1.803 m), weight 187 lb (84.8 kg), last menstrual period 05/22/2009, SpO2 97%. CONSTITUTIONAL: NAD. EYES: Pupils are equal, round, Sclera are non-icteric. EARS, NOSE, MOUTH AND THROAT: The oropharynx is clear. The oral mucosa is pink and moist. Hearing is intact to voice. LYMPH NODES:  Lymph nodes in the neck are normal. RESPIRATORY:  Lungs are clear. There is normal respiratory effort, with equal breath sounds bilaterally, and without pathologic use of accessory muscles. CARDIOVASCULAR: Heart is regular without murmurs, gallops, or rubs. BREAST: palpable 5 cm diameter mass located 6 o'clock mobile but hard, encompassing the majority of lower half of her left breast. No other palpable breast lesions on the right side, No LAD GI: The abdomen is  soft, nontender, and nondistended. There are no palpable masses. There is no hepatosplenomegaly. There are normal bowel sounds in all quadrants. GU: Rectal deferred.   MUSCULOSKELETAL: Normal muscle strength and tone. No cyanosis or edema.   SKIN: Turgor is good and there are no pathologic skin lesions or ulcers. NEUROLOGIC: Motor and sensation is grossly normal. Cranial nerves are grossly intact. PSYCH:  Oriented to person, place and time. Affect is normal.  Data Reviewed I have personally reviewed the patient's imaging, laboratory findings and medical records.    Assessment/Plan 63 year old female with triple negative breast cancer recently diagnosed on the left side in need for upfront chemotherapy.  We discussed with her about the multi disciplinary treatment that breast cancer entails.  I do agree with upfront chemotherapy.  She is on the schedule for a port placement this week.  Procedure discussed with her in detail.  The risk the benefits and the possible complications including but not limited to bleeding, infection, pneumothorax, vascular injury.  She understands and wishes to proceed. She understands that versus  chemotherapy there will be surgical therapy in the form of lumpectomy versus mastectomy. I personally spent a total of 60 minutes in the care of the patient today including performing a medically appropriate exam/evaluation, counseling and educating, placing orders, referring and communicating with other health care professionals, documenting clinical information in the EHR, independently interpreting and reviewing images studies and coordinating care.    Laneta Luna, MD FACS General Surgeon 08/02/2024, 10:32 AM

## 2024-08-02 NOTE — Progress Notes (Signed)
 Patient ID: Amber Figueroa, female   DOB: 1961/05/14, 63 y.o.   MRN: [id number]  HPI Amber Figueroa is a 63 y.o. female  patient is a 63 year old female with a past medical history significant for hypertension who underwent a bilateral diagnostic mammogram on 07/19/2024 for a palpable left breast lump..  Mammogram showed a 3 cm highly suspicious mass in the left breast. Please note that I have personally reviewed images.  No left axillary adenopathy.  Small benign right breast cysts.  No evidence of right breast malignancy.  Patient underwent core biopsy of the left breast mass which was consistent with invasive mammary carcinoma grade 2 1.4 cm ER 0% negative, PR 0% negative, Ki-67 25% and HER2 negative +1   Patient lives with her son and grandson.  She works at [employer] and is independent of her ADLs and IADLs.  Menarche at 57.  G2 P2.   Age at first birth 44.  She used birth control pills in the past remotely.  Maternal grandmother with  history of breast cancer at age 22.  She is a [employer]  Smokes daily about 1ppd MRI pers reviewed showing large mass left breast , showing no invasion of the pect but in close proximity   HPI  Past Medical History:  Diagnosis Date   Back pain    Complication of anesthesia    slow to wake up with the knee surgery   GERD (gastroesophageal reflux disease)    Hypertension    Malignant neoplasm of lower-outer quadrant of left breast of female, estrogen receptor negative (HCC) 06/2024    Past Surgical History:  Procedure Laterality Date   BREAST BIOPSY Left 07/21/2024   US  LT BREAST BX W LOC DEV 1ST LESION IMG BX SPEC US  GUIDE 07/21/2024 [facility]   COLONOSCOPY WITH PROPOFOL  N/A 07/21/2017   Procedure: COLONOSCOPY WITH PROPOFOL ;  Surgeon: Therisa Bi, MD;  Location: [facility];  Service: Gastroenterology;  Laterality: N/A;   KNEE SURGERY Left    arthroscopic    Family History  Problem Relation Age of Onset   Cancer Father         unknown type   Hypertension Sister    Hypertension Brother    Hyperlipidemia Brother    Hypertension Brother    Diabetes Brother    Hypertension Brother    Breast cancer Maternal Grandmother        likely breast ca - mastectomy   Heart attack Neg Hx    Stroke Neg Hx    Colon cancer Neg Hx    Ovarian cancer Neg Hx     Social History Social History   Tobacco Use   Smoking status: Every Day    Current packs/day: 1.00    Average packs/day: 1 pack/day for 1 year (1.0 ttl pk-yrs)    Types: Cigarettes    Passive exposure: Past   Smokeless tobacco: Never   Tobacco comments:    age [age] smoked 1/2 ppd, quit x 20 years, now smoking 1 year  Vaping Use   Vaping status: Never Used  Substance Use Topics   Alcohol use: No   Drug use: No    No Known Allergies  Current Outpatient Medications  Medication Sig Dispense Refill   albuterol  (VENTOLIN  HFA) 108 (90 Base) MCG/ACT inhaler Inhale into the lungs every 6 (six) hours as needed for wheezing or shortness of breath.     amLODipine  (NORVASC ) 10 MG tablet TAKE 1 TABLET BY MOUTH EVERY DAY 90 tablet 0  aspirin-sod bicarb-citric acid (ALKA-SELTZER) 325 MG TBEF tablet Take 325 mg by mouth every 6 (six) hours as needed.     dexamethasone (DECADRON) 4 MG tablet Take 2 tablets daily for 2 days, start the day after chemotherapy. Take with food. 30 tablet 1   lidocaine -prilocaine (EMLA) cream Apply to affected area once 30 g 3   ondansetron  (ZOFRAN ) 8 MG tablet Take 1 tablet (8 mg total) by mouth every 8 (eight) hours as needed for nausea or vomiting. Start on the third day after chemotherapy. 30 tablet 1   prochlorperazine (COMPAZINE) 10 MG tablet Take 1 tablet (10 mg total) by mouth every 6 (six) hours as needed for nausea or vomiting. 30 tablet 1   No current facility-administered medications for this visit.     Review of Systems Full ROS  was asked and was negative except for the information on the HPI  Physical Exam Blood pressure  104/67, pulse 76, height 5' 11 (1.803 m), weight 187 lb (84.8 kg), last menstrual period 05/22/2009, SpO2 97%. CONSTITUTIONAL: NAD. EYES: Pupils are equal, round, Sclera are non-icteric. EARS, NOSE, MOUTH AND THROAT: The oropharynx is clear. The oral mucosa is pink and moist. Hearing is intact to voice. LYMPH NODES:  Lymph nodes in the neck are normal. RESPIRATORY:  Lungs are clear. There is normal respiratory effort, with equal breath sounds bilaterally, and without pathologic use of accessory muscles. CARDIOVASCULAR: Heart is regular without murmurs, gallops, or rubs. BREAST: palpable 5 cm diameter mass located 6 o'clock mobile but hard, encompassing the majority of lower half of her left breast. No other palpable breast lesions on the right side, No LAD GI: The abdomen is  soft, nontender, and nondistended. There are no palpable masses. There is no hepatosplenomegaly. There are normal bowel sounds in all quadrants. GU: Rectal deferred.   MUSCULOSKELETAL: Normal muscle strength and tone. No cyanosis or edema.   SKIN: Turgor is good and there are no pathologic skin lesions or ulcers. NEUROLOGIC: Motor and sensation is grossly normal. Cranial nerves are grossly intact. PSYCH:  Oriented to person, place and time. Affect is normal.  Data Reviewed I have personally reviewed the patient's imaging, laboratory findings and medical records.    Assessment/Plan 63 year old female with triple negative breast cancer recently diagnosed on the left side in need for upfront chemotherapy.  We discussed with her about the multi disciplinary treatment that breast cancer entails.  I do agree with upfront chemotherapy.  She is on the schedule for a port placement this week.  Procedure discussed with her in detail.  The risk the benefits and the possible complications including but not limited to bleeding, infection, pneumothorax, vascular injury.  She understands and wishes to proceed. She understands that versus  chemotherapy there will be surgical therapy in the form of lumpectomy versus mastectomy. I personally spent a total of 60 minutes in the care of the patient today including performing a medically appropriate exam/evaluation, counseling and educating, placing orders, referring and communicating with other health care professionals, documenting clinical information in the EHR, independently interpreting and reviewing images studies and coordinating care.    Laneta Luna, MD FACS General Surgeon 08/02/2024, 10:32 AM

## 2024-08-02 NOTE — Patient Instructions (Signed)
We have seen you today and have spoken about your port placement. This will be scheduled at [facility] with Dr. Everlene Farrier.  Please see the [facility]) Sheet provided for further details. Our surgery scheduler will call you to look at surgery dates and go over surgery information.   Please call our office with any questions or concerns that you have.   [facility]) A central line is a soft, flexible tube (catheter) that can be used to collect blood for testing or to give medicine or nutrition through a vein. The tip of the central line ends in a large vein just above the heart called the vena cava. A central line may be placed because: You need to get medicines or fluids through an IV tube for a long period of time. You need nutrition but cannot eat or absorb nutrients. The veins in your hands or arms are hard to access. You need to have blood taken often for blood tests. You need a blood transfusion You need chemotherapy or dialysis.  There are many types of central lines: Peripherally inserted central catheter (PICC) line. This type is used for intermediate access to long-term access of one week or more. It can be used to draw blood and give fluids or medicines. A PICC looks like an IV tube, but it goes up the arm to the heart. It is usually inserted in the upper arm and taped in place on the arm. Tunneled central line. This type is used for long-term therapy and dialysis. It is placed in a large vein in the neck, chest, or groin. A tunneled central line is inserted through a small incision made over the vein and is advanced into the heart. It is tunneled beneath the skin and brought out through a second incision. Non-tunneled central line. This type is used for short-term access, usually of a maximum of 7 days. It is often used in the emergency department. A non-tunneled central line is inserted in the neck, chest, or groin. Implanted port. This type is used for long-term therapy. It  can stay in place longer than other types of central lines. An implanted port is normally inserted in the upper chest but can also be placed in the upper arm or in the abdomen. It is inserted and removed with surgery, and it is accessed using a special needle.  The type of central line that you receive depends on how long you will need it, your medical condition, and the condition of your veins. What are the risks? Using any type of central line has risks that you should be aware of, including: Infection. A blood clot that blocks the central line or forms in the vein and travels to the heart. Bleeding from the place where the central line was put in. Developing a hole or crack within the central line. If this happens, the central line will need to be replaced. Developing an abnormal heart rhythm (arrhythmia). This is rare. Central line failure.  Follow these instructions at home: Flushing and cleaning the central line Follow instructions from the health care provider about flushing and cleaning the central line. Wear a mask when flushing or cleaning the central line. Before you flush or clean the central line: Wash your hands with soap and water. Clean the central line hub with rubbing alcohol. Insertion site care Keep the insertion site of your central line clean and dry at all times. Check your incision or central line site every day for signs of infection.  Check for: More redness, swelling, or pain. More fluid or blood. Warmth. Pus or a bad smell. General instructions Follow instructions from your health care provider for the type of device that you have. If the central line accidentally gets pulled on, make sure: The bandage (dressing) is okay. There is no bleeding. The line has not been pulled out. Return to your normal activities as told by your health care provider. Ask your health care provider what activities are safe for you. You may be restricted from lifting or making  repetitive arm movements on the side with the catheter. Do not swim or bathe unless your health care provider approves. Keep your dressing dry. Your health care provider can instruct you about how to keep your specific type of dressing from getting wet. Keep all follow-up visits as told by your health care provider. This is important. Contact a health care provider if: You have more redness, swelling, or pain around your incision. You have more fluid or blood coming from your incision. Your incision feels warm to the touch. You have pus or a bad smell coming from your incision. Get help right away if: You have: Chills. A fever. Shortness of breath. Trouble breathing. Chest pain. Swelling in your neck, face, chest, or arm on the side of your central line. You are coughing. You feel your heart beating rapidly or skipping beats. You feel dizzy or you faint. Your incision or central line site has red streaks spreading away from the area. Your incision or central line site is bleeding and does not stop. Your central line is difficult to flush or will not flush. You do not get a blood return from the central line. Your central line gets loose or comes out. Your central line gets damaged. Your catheter leaks when flushed or when fluids are infused into it. This information is not intended to replace advice given to you by your health care provider. Make sure you discuss any questions you have with your health care provider. Document Released: 11/07/2005 Document Revised: 05/15/2016 Document Reviewed: 04/24/2016 Elsevier Interactive Patient Education  2017 [employer].

## 2024-08-03 ENCOUNTER — Inpatient Hospital Stay

## 2024-08-03 ENCOUNTER — Ambulatory Visit

## 2024-08-03 ENCOUNTER — Inpatient Hospital Stay: Attending: Oncology | Admitting: Oncology

## 2024-08-03 ENCOUNTER — Ambulatory Visit
Admission: RE | Admit: 2024-08-03 | Discharge: 2024-08-03 | Disposition: A | Discharge status: Home / Self Care | Attending: Surgery | Admitting: Surgery

## 2024-08-03 ENCOUNTER — Encounter
Admission: RE | Disposition: A | Discharge status: Home / Self Care | Payer: Self-pay | Source: Home / Self Care | Attending: Surgery

## 2024-08-03 ENCOUNTER — Encounter: Payer: Self-pay | Admitting: Surgery

## 2024-08-03 ENCOUNTER — Other Ambulatory Visit: Payer: Self-pay

## 2024-08-03 DIAGNOSIS — Z803 Family history of malignant neoplasm of breast: Secondary | ICD-10-CM | POA: Insufficient documentation

## 2024-08-03 DIAGNOSIS — F17210 Nicotine dependence, cigarettes, uncomplicated: Secondary | ICD-10-CM | POA: Insufficient documentation

## 2024-08-03 DIAGNOSIS — Z17421 Hormone receptor negative with human epidermal growth factor receptor 2 negative status: Secondary | ICD-10-CM | POA: Insufficient documentation

## 2024-08-03 DIAGNOSIS — I10 Essential (primary) hypertension: Secondary | ICD-10-CM | POA: Diagnosis not present

## 2024-08-03 DIAGNOSIS — Z171 Estrogen receptor negative status [ER-]: Secondary | ICD-10-CM

## 2024-08-03 DIAGNOSIS — C50512 Malignant neoplasm of lower-outer quadrant of left female breast: Secondary | ICD-10-CM | POA: Insufficient documentation

## 2024-08-03 DIAGNOSIS — Z79899 Other long term (current) drug therapy: Secondary | ICD-10-CM | POA: Insufficient documentation

## 2024-08-03 DIAGNOSIS — Z5111 Encounter for antineoplastic chemotherapy: Secondary | ICD-10-CM | POA: Insufficient documentation

## 2024-08-03 DIAGNOSIS — Z1722 Progesterone receptor negative status: Secondary | ICD-10-CM | POA: Insufficient documentation

## 2024-08-03 DIAGNOSIS — J45909 Unspecified asthma, uncomplicated: Secondary | ICD-10-CM | POA: Diagnosis not present

## 2024-08-03 DIAGNOSIS — Z5112 Encounter for antineoplastic immunotherapy: Secondary | ICD-10-CM | POA: Insufficient documentation

## 2024-08-03 DIAGNOSIS — Z1732 Human epidermal growth factor receptor 2 negative status: Secondary | ICD-10-CM | POA: Diagnosis not present

## 2024-08-03 HISTORY — PX: PORTACATH PLACEMENT: SHX2246

## 2024-08-03 SURGERY — INSERTION, TUNNELED CENTRAL VENOUS DEVICE, WITH PORT
Anesthesia: General

## 2024-08-03 MED ORDER — HEPARIN SODIUM (PORCINE) 5000 UNIT/ML IJ SOLN
INTRAMUSCULAR | Status: DC | PRN
  Administered 2024-08-03: 5000 [IU] via INTRAVENOUS

## 2024-08-03 MED ORDER — PROPOFOL 1000 MG/100ML IV EMUL
INTRAVENOUS | Status: AC
  Filled 2024-08-03: qty 100

## 2024-08-03 MED ORDER — OXYCODONE HCL 5 MG PO TABS: 5.0000 mg | ORAL_TABLET | Freq: Once | ORAL | Status: DC | PRN

## 2024-08-03 MED ORDER — FENTANYL CITRATE (PF) 100 MCG/2ML IJ SOLN
INTRAMUSCULAR | Status: DC | PRN
  Administered 2024-08-03: 50 ug via INTRAVENOUS

## 2024-08-03 MED ORDER — ACETAMINOPHEN 500 MG PO TABS
ORAL_TABLET | ORAL | Status: AC
  Filled 2024-08-03: qty 2

## 2024-08-03 MED ORDER — BUPIVACAINE-EPINEPHRINE (PF) 0.25% -1:200000 IJ SOLN
INTRAMUSCULAR | Status: DC | PRN
  Administered 2024-08-03: 28 mL via INTRAMUSCULAR

## 2024-08-03 MED ORDER — CELECOXIB 200 MG PO CAPS
ORAL_CAPSULE | ORAL | Status: AC
  Filled 2024-08-03: qty 1

## 2024-08-03 MED ORDER — ROCURONIUM BROMIDE 10 MG/ML (PF) SYRINGE
PREFILLED_SYRINGE | INTRAVENOUS | Status: AC
  Filled 2024-08-03: qty 20

## 2024-08-03 MED ORDER — EPHEDRINE 5 MG/ML INJ
INTRAVENOUS | Status: AC
  Filled 2024-08-03: qty 15

## 2024-08-03 MED ORDER — DEXAMETHASONE SOD PHOSPHATE PF 10 MG/ML IJ SOLN
INTRAMUSCULAR | Status: DC | PRN
  Administered 2024-08-03: 10 mg via INTRAVENOUS

## 2024-08-03 MED ORDER — BUPIVACAINE-EPINEPHRINE (PF) 0.25% -1:200000 IJ SOLN
INTRAMUSCULAR | Status: AC
  Filled 2024-08-03: qty 30

## 2024-08-03 MED ORDER — HYDROCODONE-ACETAMINOPHEN 5-325 MG PO TABS: 1.0000 | ORAL_TABLET | Freq: Four times a day (QID) | ORAL | 0 refills | Status: AC | PRN

## 2024-08-03 MED ORDER — CEFAZOLIN SODIUM-DEXTROSE 2-4 GM/100ML-% IV SOLN
INTRAVENOUS | Status: AC
  Filled 2024-08-03: qty 100

## 2024-08-03 MED ORDER — PHENYLEPHRINE 80 MCG/ML (10ML) SYRINGE FOR IV PUSH (FOR BLOOD PRESSURE SUPPORT)
PREFILLED_SYRINGE | INTRAVENOUS | Status: DC | PRN
  Administered 2024-08-03 (×2): 80 ug via INTRAVENOUS

## 2024-08-03 MED ORDER — MIDAZOLAM HCL 2 MG/2ML IJ SOLN
INTRAMUSCULAR | Status: AC
Start: 2024-08-03 — End: 2024-08-03
  Filled 2024-08-03: qty 2

## 2024-08-03 MED ORDER — CHLORHEXIDINE GLUCONATE 0.12 % MT SOLN
OROMUCOSAL | Status: AC
  Filled 2024-08-03: qty 15

## 2024-08-03 MED ORDER — LIDOCAINE HCL (PF) 2 % IJ SOLN
INTRAMUSCULAR | Status: AC
  Filled 2024-08-03: qty 5

## 2024-08-03 MED ORDER — GABAPENTIN 300 MG PO CAPS
ORAL_CAPSULE | ORAL | Status: AC
  Filled 2024-08-03: qty 1

## 2024-08-03 MED ORDER — PHENYLEPHRINE 80 MCG/ML (10ML) SYRINGE FOR IV PUSH (FOR BLOOD PRESSURE SUPPORT)
PREFILLED_SYRINGE | INTRAVENOUS | Status: AC
  Filled 2024-08-03: qty 20

## 2024-08-03 MED ORDER — ONDANSETRON HCL 4 MG/2ML IJ SOLN
INTRAMUSCULAR | Status: DC | PRN
  Administered 2024-08-03: 4 mg via INTRAVENOUS

## 2024-08-03 MED ORDER — PROPOFOL 500 MG/50ML IV EMUL
INTRAVENOUS | Status: DC | PRN
  Administered 2024-08-03: 100 ug/kg/min via INTRAVENOUS
  Administered 2024-08-03: 50 ug via INTRAVENOUS

## 2024-08-03 MED ORDER — LIDOCAINE HCL (CARDIAC) PF 100 MG/5ML IV SOSY
PREFILLED_SYRINGE | INTRAVENOUS | Status: DC | PRN
  Administered 2024-08-03: 80 mg via INTRAVENOUS

## 2024-08-03 MED ORDER — SUCCINYLCHOLINE CHLORIDE 200 MG/10ML IV SOSY
PREFILLED_SYRINGE | INTRAVENOUS | Status: AC
  Filled 2024-08-03: qty 10

## 2024-08-03 MED ORDER — FENTANYL CITRATE (PF) 100 MCG/2ML IJ SOLN
INTRAMUSCULAR | Status: AC
  Filled 2024-08-03: qty 2

## 2024-08-03 MED ORDER — OXYCODONE HCL 5 MG/5ML PO SOLN: 5.0000 mg | Freq: Once | ORAL | Status: DC | PRN

## 2024-08-03 MED ORDER — LIDOCAINE HCL (PF) 1 % IJ SOLN
INTRAMUSCULAR | Status: AC
  Filled 2024-08-03: qty 30

## 2024-08-03 MED ORDER — HEPARIN SODIUM (PORCINE) 5000 UNIT/ML IJ SOLN
INTRAMUSCULAR | Status: AC
  Filled 2024-08-03: qty 1

## 2024-08-03 MED ORDER — ONDANSETRON HCL 4 MG/2ML IJ SOLN
INTRAMUSCULAR | Status: AC
  Filled 2024-08-03: qty 2

## 2024-08-03 MED ORDER — MIDAZOLAM HCL (PF) 2 MG/2ML IJ SOLN
INTRAMUSCULAR | Status: DC | PRN
  Administered 2024-08-03: 2 mg via INTRAVENOUS

## 2024-08-03 MED ORDER — FENTANYL CITRATE (PF) 100 MCG/2ML IJ SOLN: 25.0000 ug | INTRAMUSCULAR | Status: DC | PRN

## 2024-08-03 SURGICAL SUPPLY — 29 items
BAG DECANTER FOR FLEXI CONT (MISCELLANEOUS) ×2 IMPLANT
BLADE SURG SZ11 CARB STEEL (BLADE) ×1 IMPLANT
CHLORAPREP W/TINT 26 (MISCELLANEOUS) ×1 IMPLANT
CLAMP SUTURE YELLOW 5 PAIRS (MISCELLANEOUS) ×1 IMPLANT
DERMABOND ADVANCED .7 DNX12 (GAUZE/BANDAGES/DRESSINGS) ×1 IMPLANT
DRAPE C-ARM XRAY 36X54 (DRAPES) ×2 IMPLANT
DRAPE INCISE IOBAN 66X45 STRL (DRAPES) ×1 IMPLANT
DRAPE SHEET LG 3/4 BI-LAMINATE (DRAPES) ×1 IMPLANT
ELECT CAUTERY BLADE 6.4 (BLADE) ×1 IMPLANT
ELECTRODE REM PT RTRN 9FT ADLT (ELECTROSURGICAL) ×1 IMPLANT
GEL ULTRASOUND 20GR AQUASONIC (MISCELLANEOUS) ×1 IMPLANT
GLOVE BIO SURGEON STRL SZ7 (GLOVE) ×1 IMPLANT
GOWN STRL REUS W/ TWL LRG LVL3 (GOWN DISPOSABLE) ×2 IMPLANT
IV 0.9% NACL 500 ML (IV SOLUTION) ×1 IMPLANT
KIT PORT INFUSION SMART 8FR (Port) ×1 IMPLANT
MANIFOLD NEPTUNE II (INSTRUMENTS) ×1 IMPLANT
NDL HYPO 22X1.5 SAFETY MO (MISCELLANEOUS) ×1 IMPLANT
NEEDLE HYPO 22X1.5 SAFETY MO (MISCELLANEOUS) ×1 IMPLANT
PACK PORT-A-CATH (MISCELLANEOUS) ×1 IMPLANT
SPONGE T-LAP 18X18 ~~LOC~~+RFID (SPONGE) ×1 IMPLANT
SUT MNCRL AB 4-0 PS2 18 (SUTURE) ×1 IMPLANT
SUT VIC AB 3-0 SH 27X BRD (SUTURE) ×1 IMPLANT
SUTURE PROLEN 2-0 RB1 36X2 ARM (SUTURE) ×1 IMPLANT
SYR 10ML LL (SYRINGE) ×1 IMPLANT
SYR 20ML LL LF (SYRINGE) ×1 IMPLANT
SYR 5ML LL (SYRINGE) ×1 IMPLANT
TOWEL OR 17X26 4PK STRL BLUE (TOWEL DISPOSABLE) ×1 IMPLANT
TRAP FLUID SMOKE EVACUATOR (MISCELLANEOUS) ×1 IMPLANT
WATER STERILE IRR 500ML POUR (IV SOLUTION) ×1 IMPLANT

## 2024-08-03 NOTE — Anesthesia Preprocedure Evaluation (Signed)
 Anesthesia Evaluation  Patient identified by MRN, date of birth, ID band Patient awake    Reviewed: Allergy & Precautions, NPO status , Patient's Chart, lab work & pertinent test results  History of Anesthesia Complications Negative for: history of anesthetic complications  Airway Mallampati: III  TM Distance: >3 FB Neck ROM: Full    Dental  (+) Poor Dentition   Pulmonary asthma , neg sleep apnea, neg COPD, Current Smoker   Pulmonary exam normal breath sounds clear to auscultation- rhonchi (-) wheezing      Cardiovascular Exercise Tolerance: Good hypertension, (-) CAD, (-) Past MI and (-) Cardiac Stents Normal cardiovascular exam Rhythm:Regular Rate:Normal - Systolic murmurs and - Diastolic murmurs    Neuro/Psych negative neurological ROS  negative psych ROS   GI/Hepatic negative GI ROS, Neg liver ROS,,,  Endo/Other  negative endocrine ROSneg diabetes    Renal/GU negative Renal ROS  negative genitourinary   Musculoskeletal   Abdominal   Peds  Hematology negative hematology ROS (+)   Anesthesia Other Findings Past Medical History: No date: Back pain No date: Complication of anesthesia     Comment:  slow to wake up with the knee surgery No date: GERD (gastroesophageal reflux disease) No date: Hypertension 06/2024: Malignant neoplasm of lower-outer quadrant of left breast of  female, estrogen receptor negative (HCC)  Past Surgical History: 07/21/2024: BREAST BIOPSY; Left     Comment:  US  LT BREAST BX W LOC DEV 1ST LESION IMG BX SPEC US                GUIDE 07/21/2024 [facility] 07/21/2017: COLONOSCOPY WITH PROPOFOL ; N/A     Comment:  Procedure: COLONOSCOPY WITH PROPOFOL ;  Surgeon: Therisa Bi, MD;  Location: [facility];  Service:               Gastroenterology;  Laterality: N/A; No date: KNEE SURGERY; Left     Comment:  arthroscopic     Reproductive/Obstetrics                               Anesthesia Physical Anesthesia Plan  ASA: 3  Anesthesia Plan: General   Post-op Pain Management: Minimal or no pain anticipated   Induction: Intravenous  PONV Risk Score and Plan: 2 and Propofol  infusion and TIVA  Airway Management Planned: Nasal Cannula  Additional Equipment: None  Intra-op Plan:   Post-operative Plan:   Informed Consent: I have reviewed the patients History and Physical, chart, labs and discussed the procedure including the risks, benefits and alternatives for the proposed anesthesia with the patient or authorized representative who has indicated his/her understanding and acceptance.     Dental advisory given  Plan Discussed with: CRNA and Surgeon  Anesthesia Plan Comments: (Discussed risks of anesthesia with patient, including possibility of difficulty with spontaneous ventilation under anesthesia necessitating airway intervention, PONV, and rare risks such as cardiac or respiratory or neurological events, and allergic reactions. Discussed the role of CRNA in patient's perioperative care. Patient understands.)        Anesthesia Quick Evaluation

## 2024-08-03 NOTE — Discharge Instructions (Signed)

## 2024-08-03 NOTE — Interval H&P Note (Signed)
 History and Physical Interval Note:  08/03/2024 1:23 PM  Amber Figueroa  has presented today for surgery, with the diagnosis of left breast cancer.  The various methods of treatment have been discussed with the patient and family. After consideration of risks, benefits and other options for treatment, the patient has consented to  Procedure(s): INSERTION, TUNNELED CENTRAL VENOUS DEVICE, WITH PORT (N/A) as a surgical intervention.  The patient's history has been reviewed, patient examined, no change in status, stable for surgery.  I have reviewed the patient's chart and labs.  Questions were answered to the patient's satisfaction.     Emannuel Vise F [name]

## 2024-08-03 NOTE — Op Note (Signed)
  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 The benefits, complications, treatment options, and expected outcomes were discussed with the patient. The risks of bleeding, infection, recurrence of symptoms, failure to resolve symptoms,  thrombosis nonfunction breakage pneumothorax hemopneumothorax any of which could require chest tube or further surgery were reviewed with the patient.   The patient was taken to Operating Room, identified as Amber Figueroa and the procedure verified.  A Time Out was held and the above information confirmed.  Prior to the induction of general anesthesia, antibiotic prophylaxis was administered. VTE prophylaxis was in place. Appropriate anesthesia was then administered and tolerated well. The chest was prepped with Chloraprep and draped in the sterile fashion. The patient was positioned in the supine position. Then the patient was placed in Trendelenburg position.  Patient was prepped and draped in sterile fashion and in a Trendelenburg position local anesthetic was infiltrated into the skin and subcutaneous tissues in the neck and anterior chest wall. The large bore needle was placed into the internal jugular vein under U/S guidance without difficulty and then the Seldinger wire was advanced. Fluoroscopy was utilized to confirm that the Seldinger wire was in the superior vena cava.  An incision was made and a port pocket developed with blunt and electrocautery dissection. The introducer dilator was placed  over the Seldinger wire the wire was removed. The previously flushed catheter was placed into the introducer dilator and the peel-away sheath was removed. The catheter length was confirmed and trimmed utilizing fluoroscopy for proper positioning. The catheter was then attached to the previously flushed port. The port was placed into the pocket. The port was held in with 2-0 Prolenes and flushed for function and heparin locked.  The wound was closed with interrupted 3-0 Vicryl followed by 4-0 subcuticular Monocryl sutures. Dermabond used to coat the skin  Patient was taken to the recovery room in stable condition where a postoperative chest film has been ordered.

## 2024-08-03 NOTE — Transfer of Care (Signed)
 Immediate Anesthesia Transfer of Care Note  Patient: Amber Figueroa  Procedure(s) Performed: INSERTION, TUNNELED CENTRAL VENOUS DEVICE, WITH PORT  Patient Location: PACU  Anesthesia Type:MAC  Level of Consciousness: drowsy  Airway & Oxygen Therapy: Patient Spontanous Breathing and Patient connected to face mask oxygen  Post-op Assessment: Report given to RN and Post -op Vital signs reviewed and stable  Post vital signs: Reviewed and stable  Last Vitals:  Vitals Value Taken Time  BP 109/60 08/03/24 14:50  Temp    Pulse 71 08/03/24 14:53  Resp 14 08/03/24 14:53  SpO2 100 % 08/03/24 14:53  Vitals shown include unfiled device data.  Last Pain:  Vitals:   08/03/24 1153  TempSrc: Temporal  PainSc: 0-No pain         Complications: No notable events documented.

## 2024-08-04 ENCOUNTER — Encounter: Payer: Self-pay | Admitting: Surgery

## 2024-08-04 ENCOUNTER — Other Ambulatory Visit

## 2024-08-05 ENCOUNTER — Inpatient Hospital Stay

## 2024-08-05 ENCOUNTER — Ambulatory Visit

## 2024-08-05 ENCOUNTER — Encounter: Payer: Self-pay | Admitting: Oncology

## 2024-08-05 ENCOUNTER — Encounter: Payer: Self-pay | Admitting: Surgery

## 2024-08-05 NOTE — Anesthesia Postprocedure Evaluation (Signed)
 Anesthesia Post Note  Patient: Amber Figueroa  Procedure(s) Performed: INSERTION, TUNNELED CENTRAL VENOUS DEVICE, WITH PORT  Patient location during evaluation: PACU Anesthesia Type: General Level of consciousness: awake and alert Pain management: pain level controlled Vital Signs Assessment: post-procedure vital signs reviewed and stable Respiratory status: spontaneous breathing, nonlabored ventilation, respiratory function stable and patient connected to nasal cannula oxygen Cardiovascular status: blood pressure returned to baseline and stable Postop Assessment: no apparent nausea or vomiting Anesthetic complications: no   No notable events documented.   Last Vitals:  Vitals:   08/03/24 1610 08/03/24 1630  BP: (!) 147/85 (!) 143/93  Pulse: 74 71  Resp: 18 16  Temp: (!) 36.1 C   SpO2: 97% 100%    Last Pain:  Vitals:   08/04/24 1003  TempSrc:   PainSc: 0-No pain                 Debby Mines

## 2024-08-05 NOTE — Progress Notes (Signed)
 [facility] CSW Progress Note  Clinical Social Work introduced self to patient during Patient Education with Raoul Moats, Charity fundraiser.  Provided information regarding CSW role, including counseling, advanced care planning and support group.  Answered questions as needed.  Follow Up Plan:  CSW will follow-up with patient by phone     Macario CHRISTELLA Au, LCSW Clinical Social Worker [facility]

## 2024-08-06 ENCOUNTER — Inpatient Hospital Stay (HOSPITAL_BASED_OUTPATIENT_CLINIC_OR_DEPARTMENT_OTHER): Admitting: Oncology

## 2024-08-06 ENCOUNTER — Inpatient Hospital Stay

## 2024-08-06 ENCOUNTER — Other Ambulatory Visit: Payer: Self-pay | Admitting: Oncology

## 2024-08-06 ENCOUNTER — Encounter: Payer: Self-pay | Admitting: Oncology

## 2024-08-06 ENCOUNTER — Telehealth: Payer: Self-pay

## 2024-08-06 ENCOUNTER — Telehealth: Payer: Self-pay | Admitting: *Deleted

## 2024-08-06 VITALS — BP 139/77 | HR 88 | Temp 96.4°F | Resp 16

## 2024-08-06 VITALS — BP 117/74 | HR 76 | Temp 96.1°F | Resp 18 | Ht 71.0 in | Wt 186.6 lb

## 2024-08-06 DIAGNOSIS — C50512 Malignant neoplasm of lower-outer quadrant of left female breast: Secondary | ICD-10-CM | POA: Diagnosis present

## 2024-08-06 DIAGNOSIS — Z79899 Other long term (current) drug therapy: Secondary | ICD-10-CM | POA: Diagnosis not present

## 2024-08-06 DIAGNOSIS — F17210 Nicotine dependence, cigarettes, uncomplicated: Secondary | ICD-10-CM | POA: Diagnosis not present

## 2024-08-06 DIAGNOSIS — R928 Other abnormal and inconclusive findings on diagnostic imaging of breast: Secondary | ICD-10-CM

## 2024-08-06 DIAGNOSIS — Z5111 Encounter for antineoplastic chemotherapy: Secondary | ICD-10-CM | POA: Diagnosis present

## 2024-08-06 DIAGNOSIS — Z803 Family history of malignant neoplasm of breast: Secondary | ICD-10-CM | POA: Diagnosis not present

## 2024-08-06 DIAGNOSIS — Z5112 Encounter for antineoplastic immunotherapy: Secondary | ICD-10-CM

## 2024-08-06 DIAGNOSIS — Z17421 Hormone receptor negative with human epidermal growth factor receptor 2 negative status: Secondary | ICD-10-CM | POA: Diagnosis not present

## 2024-08-06 DIAGNOSIS — Z1722 Progesterone receptor negative status: Secondary | ICD-10-CM | POA: Diagnosis not present

## 2024-08-06 DIAGNOSIS — Z171 Estrogen receptor negative status [ER-]: Secondary | ICD-10-CM

## 2024-08-06 LAB — CBC WITH DIFFERENTIAL (CANCER CENTER ONLY)
Abs Immature Granulocytes: 0.02 K/uL (ref 0.00–0.07)
Basophils Absolute: 0 K/uL (ref 0.0–0.1)
Basophils Relative: 1 %
Eosinophils Absolute: 0.2 K/uL (ref 0.0–0.5)
Eosinophils Relative: 3 %
HCT: 37.1 % (ref 36.0–46.0)
Hemoglobin: 12.4 g/dL (ref 12.0–15.0)
Immature Granulocytes: 0 %
Lymphocytes Relative: 29 %
Lymphs Abs: 1.9 K/uL (ref 0.7–4.0)
MCH: 28.8 pg (ref 26.0–34.0)
MCHC: 33.4 g/dL (ref 30.0–36.0)
MCV: 86.3 fL (ref 80.0–100.0)
Monocytes Absolute: 0.5 K/uL (ref 0.1–1.0)
Monocytes Relative: 8 %
Neutro Abs: 4.1 K/uL (ref 1.7–7.7)
Neutrophils Relative %: 59 %
Platelet Count: 268 K/uL (ref 150–400)
RBC: 4.3 MIL/uL (ref 3.87–5.11)
RDW: 14.2 % (ref 11.5–15.5)
WBC Count: 6.7 K/uL (ref 4.0–10.5)
nRBC: 0 % (ref 0.0–0.2)

## 2024-08-06 LAB — CMP (CANCER CENTER ONLY)
ALT: 13 U/L (ref 0–44)
AST: 17 U/L (ref 15–41)
Albumin: 3.8 g/dL (ref 3.5–5.0)
Alkaline Phosphatase: 62 U/L (ref 38–126)
Anion gap: 7 (ref 5–15)
BUN: 15 mg/dL (ref 8–23)
CO2: 26 mmol/L (ref 22–32)
Calcium: 9 mg/dL (ref 8.9–10.3)
Chloride: 107 mmol/L (ref 98–111)
Creatinine: 0.75 mg/dL (ref 0.44–1.00)
GFR, Estimated: >60 mL/min
Glucose, Bld: 89 mg/dL (ref 70–99)
Potassium: 3.5 mmol/L (ref 3.5–5.1)
Sodium: 140 mmol/L (ref 135–145)
Total Bilirubin: 0.8 mg/dL (ref 0.0–1.2)
Total Protein: 6.7 g/dL (ref 6.5–8.1)

## 2024-08-06 LAB — TSH: TSH: 4.403 u[IU]/mL (ref 0.350–4.500)

## 2024-08-06 MED ORDER — PALONOSETRON HCL INJECTION 0.25 MG/5ML
0.2500 mg | Freq: Once | INTRAVENOUS | Status: AC
  Administered 2024-08-06: 0.25 mg via INTRAVENOUS
  Filled 2024-08-06: qty 5

## 2024-08-06 MED ORDER — SODIUM CHLORIDE 0.9 % IV SOLN
80.0000 mg/m2 | Freq: Once | INTRAVENOUS | Status: AC
  Administered 2024-08-06: 162 mg via INTRAVENOUS
  Filled 2024-08-06: qty 27

## 2024-08-06 MED ORDER — FAMOTIDINE IN NACL 20-0.9 MG/50ML-% IV SOLN
20.0000 mg | Freq: Once | INTRAVENOUS | Status: AC
  Administered 2024-08-06: 20 mg via INTRAVENOUS
  Filled 2024-08-06: qty 50

## 2024-08-06 MED ORDER — SODIUM CHLORIDE 0.9 % IV SOLN
200.0000 mg | Freq: Once | INTRAVENOUS | Status: AC
  Administered 2024-08-06: 200 mg via INTRAVENOUS
  Filled 2024-08-06: qty 8

## 2024-08-06 MED ORDER — SODIUM CHLORIDE 0.9 % IV SOLN
INTRAVENOUS | Status: DC
  Filled 2024-08-06: qty 250

## 2024-08-06 MED ORDER — DIPHENHYDRAMINE HCL 50 MG/ML IJ SOLN
50.0000 mg | Freq: Once | INTRAMUSCULAR | Status: AC | PRN
  Administered 2024-08-06: 25 mg via INTRAVENOUS

## 2024-08-06 MED ORDER — SODIUM CHLORIDE 0.9 % IV SOLN
Freq: Once | INTRAVENOUS | Status: DC | PRN
  Filled 2024-08-06: qty 250

## 2024-08-06 MED ORDER — DIPHENHYDRAMINE HCL 50 MG/ML IJ SOLN
50.0000 mg | Freq: Once | INTRAMUSCULAR | Status: AC
  Administered 2024-08-06: 50 mg via INTRAVENOUS
  Filled 2024-08-06: qty 1

## 2024-08-06 MED ORDER — SODIUM CHLORIDE 0.9 % IV SOLN
179.5500 mg | Freq: Once | INTRAVENOUS | Status: AC
  Administered 2024-08-06: 180 mg via INTRAVENOUS
  Filled 2024-08-06: qty 18

## 2024-08-06 MED ORDER — DEXAMETHASONE SOD PHOSPHATE PF 10 MG/ML IJ SOLN
10.0000 mg | Freq: Once | INTRAMUSCULAR | Status: AC
  Administered 2024-08-06: 10 mg via INTRAVENOUS

## 2024-08-06 NOTE — Progress Notes (Signed)
 Patient's 1st treatment is today; no new or acute concerns.

## 2024-08-06 NOTE — Patient Instructions (Signed)
 [facility] - A DEPT OF . [facility]  Discharge Instructions: Thank you for choosing [facility] to provide your oncology and hematology care.  If you have a lab appointment with the Cancer Center, please go directly to the Cancer Center and check in at the registration area.  Wear comfortable clothing and clothing appropriate for easy access to any Portacath or PICC line.   We strive to give you quality time with your provider. You may need to reschedule your appointment if you arrive late (15 or more minutes).  Arriving late affects you and other patients whose appointments are after yours.  Also, if you miss three or more appointments without notifying the office, you may be dismissed from the clinic at the provider's discretion.      For prescription refill requests, have your pharmacy contact our office and allow 72 hours for refills to be completed.    Today you received the following chemotherapy and/or immunotherapy agents Keytruda, Taxol and Carboplatin       To help prevent nausea and vomiting after your treatment, we encourage you to take your nausea medication as directed.  BELOW ARE SYMPTOMS THAT SHOULD BE REPORTED IMMEDIATELY: *FEVER GREATER THAN 100.4 F (38 C) OR HIGHER *CHILLS OR SWEATING *NAUSEA AND VOMITING THAT IS NOT CONTROLLED WITH YOUR NAUSEA MEDICATION *UNUSUAL SHORTNESS OF BREATH *UNUSUAL BRUISING OR BLEEDING *URINARY PROBLEMS (pain or burning when urinating, or frequent urination) *BOWEL PROBLEMS (unusual diarrhea, constipation, pain near the anus) TENDERNESS IN MOUTH AND THROAT WITH OR WITHOUT PRESENCE OF ULCERS (sore throat, sores in mouth, or a toothache) UNUSUAL RASH, SWELLING OR PAIN  UNUSUAL VAGINAL DISCHARGE OR ITCHING   Items with * indicate a potential emergency and should be followed up as soon as possible or go to the Emergency Department if any problems should occur.  Please show the CHEMOTHERAPY ALERT  CARD or IMMUNOTHERAPY ALERT CARD at check-in to the Emergency Department and triage nurse.  Should you have questions after your visit or need to cancel or reschedule your appointment, please contact [facility] - A DEPT OF [facility]  [phone] and follow the prompts.  Office hours are 8:00 a.m. to 4:30 p.m. Monday - Friday. Please note that voicemails left after 4:00 p.m. may not be returned until the following business day.  We are closed weekends and major holidays. You have access to a nurse at all times for urgent questions. Please call the main number to the clinic [phone] and follow the prompts.  For any non-urgent questions, you may also contact your provider using MyChart. We now offer e-Visits for anyone 35 and older to request care online for non-urgent symptoms. For details visit [email or web address].   Also download the MyChart app! Go to the app store, search "MyChart", open the app, select [place], and log in with your MyChart username and password.    Pembrolizumab Injection What is this medication? PEMBROLIZUMAB (PEM broe LIZ ue mab) treats some types of cancer. It works by helping your immune system slow or stop the spread of cancer cells. It is a monoclonal antibody. This medicine may be used for other purposes; ask your health care provider or pharmacist if you have questions. COMMON BRAND NAME(S): Keytruda What should I tell my care team before I take this medication? They need to know if you have any of these conditions: Allogeneic stem cell transplant (uses someone else's stem cells)  Autoimmune diseases, such as Crohn disease, ulcerative colitis, lupus History of chest radiation Nervous system problems, such as Guillain-Barre syndrome, myasthenia gravis Organ transplant An unusual or allergic reaction to pembrolizumab, other medications, foods, dyes, or preservatives Pregnant or trying to get  pregnant Breast-feeding How should I use this medication? This medication is injected into a vein. It is given by your care team in a hospital or clinic setting. A special MedGuide will be given to you before each treatment. Be sure to read this information carefully each time. Talk to your care team about the use of this medication in children. While it may be prescribed for children as young as 6 months for selected conditions, precautions do apply. Overdosage: If you think you have taken too much of this medicine contact a poison control center or emergency room at once. NOTE: This medicine is only for you. Do not share this medicine with others. What if I miss a dose? Keep appointments for follow-up doses. It is important not to miss your dose. Call your care team if you are unable to keep an appointment. What may interact with this medication? Interactions have not been studied. This list may not describe all possible interactions. Give your health care provider a list of all the medicines, herbs, non-prescription drugs, or dietary supplements you use. Also tell them if you smoke, drink alcohol , or use illegal drugs. Some items may interact with your medicine. What should I watch for while using this medication? Your condition will be monitored carefully while you are receiving this medication. You may need blood work while taking this medication. This medication may cause serious skin reactions. They can happen weeks to months after starting the medication. Contact your care team right away if you notice fevers or flu-like symptoms with a rash. The rash may be red or purple and then turn into blisters or peeling of the skin. You may also notice a red rash with swelling of the face, lips, or lymph nodes in your neck or under your arms. Tell your care team right away if you have any change in your eyesight. Talk to your care team if you may be pregnant. Serious birth defects can occur if you  take this medication during pregnancy and for 4 months after the last dose. You will need a negative pregnancy test before starting this medication. Contraception is recommended while taking this medication and for 4 months after the last dose. Your care team can help you find the option that works for you. Do not breastfeed while taking this medication and for 4 months after the last dose. What side effects may I notice from receiving this medication? Side effects that you should report to your care team as soon as possible: Allergic reactions--skin rash, itching, hives, swelling of the face, lips, tongue, or throat Dry cough, shortness of breath or trouble breathing Eye pain, redness, irritation, or discharge with blurry or decreased vision Heart muscle inflammation--unusual weakness or fatigue, shortness of breath, chest pain, fast or irregular heartbeat, dizziness, swelling of the ankles, feet, or hands Hormone gland problems--headache, sensitivity to light, unusual weakness or fatigue, dizziness, fast or irregular heartbeat, increased sensitivity to cold or heat, excessive sweating, constipation, hair loss, increased thirst or amount of urine, tremors or shaking, irritability Infusion reactions--chest pain, shortness of breath or trouble breathing, feeling faint or lightheaded Kidney injury (glomerulonephritis)--decrease in the amount of urine, red or dark brown urine, foamy or bubbly urine, swelling of the ankles, hands, or feet  Liver injury--right upper belly pain, loss of appetite, nausea, light-colored stool, dark yellow or brown urine, yellowing skin or eyes, unusual weakness or fatigue Pain, tingling, or numbness in the hands or feet, muscle weakness, change in vision, confusion or trouble speaking, loss of balance or coordination, trouble walking, seizures Rash, fever, and swollen lymph nodes Redness, blistering, peeling, or loosening of the skin, including inside the mouth Sudden or  severe stomach pain, bloody diarrhea, fever, nausea, vomiting Side effects that usually do not require medical attention (report to your care team if they continue or are bothersome): Bone, joint, or muscle pain Diarrhea Fatigue Loss of appetite Nausea Skin rash This list may not describe all possible side effects. Call your doctor for medical advice about side effects. You may report side effects to FDA at 1-800-FDA-1088. Where should I keep my medication? This medication is given in a hospital or clinic. It will not be stored at home. NOTE: This sheet is a summary. It may not cover all possible information. If you have questions about this medicine, talk to your doctor, pharmacist, or health care provider.  2024 Elsevier/Gold Standard (2022-01-29 00:00:00)     Paclitaxel Injection What is this medication? PACLITAXEL (PAK li TAX el) treats some types of cancer. It works by slowing down the growth of cancer cells. This medicine may be used for other purposes; ask your health care provider or pharmacist if you have questions. COMMON BRAND NAME(S): Onxol, Taxol What should I tell my care team before I take this medication? They need to know if you have any of these conditions: Heart disease Liver disease Low white blood cell levels An unusual or allergic reaction to paclitaxel, other medications, foods, dyes, or preservatives If you or your partner are pregnant or trying to get pregnant Breast-feeding How should I use this medication? This medication is injected into a vein. It is given by your care team in a hospital or clinic setting. Talk to your care team about the use of this medication in children. While it may be given to children for selected conditions, precautions do apply. Overdosage: If you think you have taken too much of this medicine contact a poison control center or emergency room at once. NOTE: This medicine is only for you. Do not share this medicine with  others. What if I miss a dose? Keep appointments for follow-up doses. It is important not to miss your dose. Call your care team if you are unable to keep an appointment. What may interact with this medication? Do not take this medication with any of the following: Live virus vaccines Other medications may affect the way this medication works. Talk with your care team about all of the medications you take. They may suggest changes to your treatment plan to lower the risk of side effects and to make sure your medications work as intended. This list may not describe all possible interactions. Give your health care provider a list of all the medicines, herbs, non-prescription drugs, or dietary supplements you use. Also tell them if you smoke, drink alcohol , or use illegal drugs. Some items may interact with your medicine. What should I watch for while using this medication? Your condition will be monitored carefully while you are receiving this medication. You may need blood work while taking this medication. This medication may make you feel generally unwell. This is not uncommon as chemotherapy can affect healthy cells as well as cancer cells. Report any side effects. Continue your course of treatment  even though you feel ill unless your care team tells you to stop. This medication can cause serious allergic reactions. To reduce the risk, your care team may give you other medications to take before receiving this one. Be sure to follow the directions from your care team. This medication may increase your risk of getting an infection. Call your care team for advice if you get a fever, chills, sore throat, or other symptoms of a cold or flu. Do not treat yourself. Try to avoid being around people who are sick. This medication may increase your risk to bruise or bleed. Call your care team if you notice any unusual bleeding. Be careful brushing or flossing your teeth or using a toothpick because you may get  an infection or bleed more easily. If you have any dental work done, tell your dentist you are receiving this medication. Talk to your care team if you may be pregnant. Serious birth defects can occur if you take this medication during pregnancy. Talk to your care team before breastfeeding. Changes to your treatment plan may be needed. What side effects may I notice from receiving this medication? Side effects that you should report to your care team as soon as possible: Allergic reactions--skin rash, itching, hives, swelling of the face, lips, tongue, or throat Heart rhythm changes--fast or irregular heartbeat, dizziness, feeling faint or lightheaded, chest pain, trouble breathing Increase in blood pressure Infection--fever, chills, cough, sore throat, wounds that don't heal, pain or trouble when passing urine, general feeling of discomfort or being unwell Low blood pressure--dizziness, feeling faint or lightheaded, blurry vision Low red blood cell level--unusual weakness or fatigue, dizziness, headache, trouble breathing Painful swelling, warmth, or redness of the skin, blisters or sores at the infusion site Pain, tingling, or numbness in the hands or feet Slow heartbeat--dizziness, feeling faint or lightheaded, confusion, trouble breathing, unusual weakness or fatigue Unusual bruising or bleeding Side effects that usually do not require medical attention (report to your care team if they continue or are bothersome): Diarrhea Hair loss Joint pain Loss of appetite Muscle pain Nausea Vomiting This list may not describe all possible side effects. Call your doctor for medical advice about side effects. You may report side effects to FDA at 1-800-FDA-1088. Where should I keep my medication? This medication is given in a hospital or clinic. It will not be stored at home. NOTE: This sheet is a summary. It may not cover all possible information. If you have questions about this medicine, talk to  your doctor, pharmacist, or health care provider.  2024 Elsevier/Gold Standard (2022-02-05 00:00:00)     Carboplatin Injection What is this medication? CARBOPLATIN (KAR boe pla tin) treats some types of cancer. It works by slowing down the growth of cancer cells. This medicine may be used for other purposes; ask your health care provider or pharmacist if you have questions. COMMON BRAND NAME(S): Paraplatin What should I tell my care team before I take this medication? They need to know if you have any of these conditions: Blood disorders Hearing problems Kidney disease Recent or ongoing radiation therapy An unusual or allergic reaction to carboplatin, cisplatin, other medications, foods, dyes, or preservatives Pregnant or trying to get pregnant Breast-feeding How should I use this medication? This medication is injected into a vein. It is given by your care team in a hospital or clinic setting. Talk to your care team about the use of this medication in children. Special care may be needed. Overdosage: If you think  you have taken too much of this medicine contact a poison control center or emergency room at once. NOTE: This medicine is only for you. Do not share this medicine with others. What if I miss a dose? Keep appointments for follow-up doses. It is important not to miss your dose. Call your care team if you are unable to keep an appointment. What may interact with this medication? Medications for seizures Some antibiotics, such as amikacin, gentamicin, neomycin, streptomycin, tobramycin Vaccines This list may not describe all possible interactions. Give your health care provider a list of all the medicines, herbs, non-prescription drugs, or dietary supplements you use. Also tell them if you smoke, drink alcohol , or use illegal drugs. Some items may interact with your medicine. What should I watch for while using this medication? Your condition will be monitored carefully while  you are receiving this medication. You may need blood work while taking this medication. This medication may make you feel generally unwell. This is not uncommon, as chemotherapy can affect healthy cells as well as cancer cells. Report any side effects. Continue your course of treatment even though you feel ill unless your care team tells you to stop. In some cases, you may be given additional medications to help with side effects. Follow all directions for their use. This medication may increase your risk of getting an infection. Call your care team for advice if you get a fever, chills, sore throat, or other symptoms of a cold or flu. Do not treat yourself. Try to avoid being around people who are sick. Avoid taking medications that contain aspirin, acetaminophen , ibuprofen, naproxen , or ketoprofen unless instructed by your care team. These medications may hide a fever. Be careful brushing or flossing your teeth or using a toothpick because you may get an infection or bleed more easily. If you have any dental work done, tell your dentist you are receiving this medication. Talk to your care team if you wish to become pregnant or think you might be pregnant. This medication can cause serious birth defects. Talk to your care team about effective forms of contraception. Do not breast-feed while taking this medication. What side effects may I notice from receiving this medication? Side effects that you should report to your care team as soon as possible: Allergic reactions--skin rash, itching, hives, swelling of the face, lips, tongue, or throat Infection--fever, chills, cough, sore throat, wounds that don't heal, pain or trouble when passing urine, general feeling of discomfort or being unwell Low red blood cell level--unusual weakness or fatigue, dizziness, headache, trouble breathing Pain, tingling, or numbness in the hands or feet, muscle weakness, change in vision, confusion or trouble speaking, loss  of balance or coordination, trouble walking, seizures Unusual bruising or bleeding Side effects that usually do not require medical attention (report to your care team if they continue or are bothersome): Hair loss Nausea Unusual weakness or fatigue Vomiting This list may not describe all possible side effects. Call your doctor for medical advice about side effects. You may report side effects to FDA at 1-800-FDA-1088. Where should I keep my medication? This medication is given in a hospital or clinic. It will not be stored at home. NOTE: This sheet is a summary. It may not cover all possible information. If you have questions about this medicine, talk to your doctor, pharmacist, or health care provider.  2024 Elsevier/Gold Standard (2022-01-08 00:00:00)

## 2024-08-06 NOTE — Telephone Encounter (Signed)
 Met patient while she was getting her first chemotherapy treatment.  Informed her of her appointment for her MRI guided breast biopsy and reviewed instructions as follows:  No food for 4 hours prior, only water, no lotion, jewelry, or powder.  Patient verbalizes understanding.

## 2024-08-06 NOTE — Progress Notes (Signed)
 1057: Pt develops a slight cough, pt reports throat feels tight, head/ears feel hot, back pain and shortness of breath. Taxol paused. NS started to gravity, Tinnie Dawn NP aware.  Benadryl 25 mg IV given per Protocol Tinnie Dawn NP at chairside, pt reports symptoms are resolving.  1102: Per Tinnie Dawn give NS at 999 ml/hr for 15 minutes and restart Taxol at 30 ml/hr and titrate every 15 minutes.  1107: Pt reports symptoms fully resolved and back to baseline.   1400: Pt tolerated remainder of infusion without complication, no s/s of distress. Pt stable at discharge, and educated to call clinic with any questions or concerns. Pt verbalizes understanding.

## 2024-08-06 NOTE — Telephone Encounter (Signed)
 Intermittent FMLA request received.

## 2024-08-06 NOTE — Progress Notes (Signed)
 Hematology/Oncology Consult note [facility]  Telephone:([phone] [phone]  Patient Care Team: Antonette Angeline ORN, NP as PCP - General (Internal Medicine) Georgina Shasta POUR, RN as Oncology Nurse Navigator Melanee Annah BROCKS, MD as Consulting Physician (Oncology)   Name of the patient: Amber Figueroa  [id number]  12/27/1960   Date of visit: 08/06/24  Diagnosis-  Cancer Staging  Malignant neoplasm of lower-outer quadrant of left breast of female, estrogen receptor negative (HCC) Staging form: Breast, AJCC 8th Edition - Clinical stage from 07/27/2024: Stage IIB (cT2, cN0, cM0, G2, ER-, PR-, HER2-) - Signed by Melanee Annah BROCKS, MD on 07/27/2024 Stage prefix: Initial diagnosis Histologic grading system: 3 grade system    Chief complaint/ Reason for visit-on treatment assessment prior to cycle 1 of neoadjuvant weekly CarboTaxol chemotherapy along with Keytruda  Heme/Onc history: patient is a 63 year old female with a past medical history significant for hypertension who underwent a bilateral diagnostic mammogram on 07/19/2024 for a palpable left breast lump..  Mammogram showed a 3 cm highly suspicious mass in the left breast.  No left axillary adenopathy.  Small benign right breast cysts.  No evidence of right breast malignancy.  Patient underwent core biopsy of the left breast mass which was consistent with invasive mammary carcinoma grade 2 1.4 cm ER 0% negative, PR 0% negative, Ki-67 25% and HER2 negative +1   Patient lives with her son and grandson.  She works at [employer] and is independent of her ADLs and IADLs.  Menarche at 35.  G2 P2.  Age at first birth 18.  She used birth control pills in the past remotely.  Maternal grandmother with possible history of breast cancer.  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 MRI bilateral  breast 07/27/2024 showed 3 cm biopsy-proven breast carcinoma in the inferior aspect of the left breast.  Indeterminate non-mass enhancement extending anteriorly which may represent additional malignancy.  No evidence of right breast malignancy.  No evidence of axillary adenopathy.  Interval history-she is doing well presently and denies any complaints at this time  ECOG PS- 1 Pain scale- 0   Review of systems- Review of Systems  Constitutional:  Negative for chills, fever, malaise/fatigue and weight loss.  HENT:  Negative for congestion, ear discharge and nosebleeds.   Eyes:  Negative for blurred vision.  Respiratory:  Negative for cough, hemoptysis, sputum production, shortness of breath and wheezing.   Cardiovascular:  Negative for chest pain, palpitations, orthopnea and claudication.  Gastrointestinal:  Negative for abdominal pain, blood in stool, constipation, diarrhea, heartburn, melena, nausea and vomiting.  Genitourinary:  Negative for dysuria, flank pain, frequency, hematuria and urgency.  Musculoskeletal:  Negative for back pain, joint pain and myalgias.  Skin:  Negative for rash.  Neurological:  Negative for dizziness, tingling, focal weakness, seizures, weakness and headaches.  Endo/Heme/Allergies:  Does not bruise/bleed easily.  Psychiatric/Behavioral:  Negative for depression and suicidal ideas. The patient does not have insomnia.       No Known Allergies   Past Medical History:  Diagnosis Date   Back pain    Complication of anesthesia    slow to wake up with the knee surgery   GERD (gastroesophageal reflux disease)    Hypertension    Malignant neoplasm of lower-outer quadrant of left breast of female, estrogen receptor negative (HCC) 06/2024  Past Surgical History:  Procedure Laterality Date   BREAST BIOPSY Left 07/21/2024   US  LT BREAST BX W LOC DEV 1ST LESION IMG BX SPEC US  GUIDE 07/21/2024 [facility]   COLONOSCOPY WITH PROPOFOL  N/A 07/21/2017    Procedure: COLONOSCOPY WITH PROPOFOL ;  Surgeon: Therisa Bi, MD;  Location: [facility];  Service: Gastroenterology;  Laterality: N/A;   KNEE SURGERY Left    arthroscopic   PORTACATH PLACEMENT N/A 08/03/2024   Procedure: INSERTION, TUNNELED CENTRAL VENOUS DEVICE, WITH PORT;  Surgeon: Jordis Laneta FALCON, MD;  Location: [facility] ORS;  Service: General;  Laterality: N/A;    Social History   Socioeconomic History   Marital status: Legally Separated    Spouse name: Not on file   Number of children: 2   Years of education: Not on file   Highest education level: Not on file  Occupational History   Occupation: warehouse  Tobacco Use   Smoking status: Every Day    Current packs/day: 1.00    Average packs/day: 1 pack/day for 1 year (1.0 ttl pk-yrs)    Types: Cigarettes    Passive exposure: Past   Smokeless tobacco: Never   Tobacco comments:    age [age] smoked 1/2 ppd, quit x 20 years, now smoking 1 year  Vaping Use   Vaping status: Never Used  Substance and Sexual Activity   Alcohol use: No   Drug use: No   Sexual activity: Never  Other Topics Concern   Not on file  Social History Narrative   Lives with son and grandbaby   Social Drivers of Health   Financial Resource Strain: Not on file  Food Insecurity: No Food Insecurity (07/27/2024)   Hunger Vital Sign    Worried About Running Out of Food in the Last Year: Never true    Ran Out of Food in the Last Year: Never true  Transportation Needs: No Transportation Needs (07/27/2024)   PRAPARE - Administrator, Civil Service (Medical): No    Lack of Transportation (Non-Medical): No  Physical Activity: Not on file  Stress: Not on file  Social Connections: Not on file  Intimate Partner Violence: Not At Risk (07/27/2024)   Humiliation, Afraid, Rape, and Kick questionnaire    Fear of Current or Ex-Partner: No    Emotionally Abused: No    Physically Abused: No    Sexually Abused: No    Family History  Problem Relation Age  of Onset   Cancer Father        unknown type   Hypertension Sister    Hypertension Brother    Hyperlipidemia Brother    Hypertension Brother    Diabetes Brother    Hypertension Brother    Breast cancer Maternal Grandmother        likely breast ca - mastectomy   Heart attack Neg Hx    Stroke Neg Hx    Colon cancer Neg Hx    Ovarian cancer Neg Hx      Current Outpatient Medications:    albuterol  (VENTOLIN  HFA) 108 (90 Base) MCG/ACT inhaler, Inhale into the lungs every 6 (six) hours as needed for wheezing or shortness of breath., Disp: , Rfl:    amLODipine  (NORVASC ) 10 MG tablet, TAKE 1 TABLET BY MOUTH EVERY DAY, Disp: 90 tablet, Rfl: 0   aspirin-sod bicarb-citric acid (ALKA-SELTZER) 325 MG TBEF tablet, Take 325 mg by mouth every 6 (six) hours as needed., Disp: , Rfl:    dexamethasone (DECADRON) 4 MG tablet, Take 2 tablets  daily for 2 days, start the day after chemotherapy. Take with food., Disp: 30 tablet, Rfl: 1   HYDROcodone -acetaminophen  (NORCO/VICODIN) 5-325 MG tablet, Take 1 tablet by mouth every 6 (six) hours as needed for moderate pain (pain score 4-6)., Disp: 12 tablet, Rfl: 0   lidocaine -prilocaine (EMLA) cream, Apply to affected area once, Disp: 30 g, Rfl: 3   ondansetron  (ZOFRAN ) 8 MG tablet, Take 1 tablet (8 mg total) by mouth every 8 (eight) hours as needed for nausea or vomiting. Start on the third day after chemotherapy., Disp: 30 tablet, Rfl: 1   prochlorperazine (COMPAZINE) 10 MG tablet, Take 1 tablet (10 mg total) by mouth every 6 (six) hours as needed for nausea or vomiting., Disp: 30 tablet, Rfl: 1 No current facility-administered medications for this visit.  Facility-Administered Medications Ordered in Other Visits:    0.9 %  sodium chloride  infusion, , Intravenous, Continuous, Melanee Annah BROCKS, MD, Last Rate: 10 mL/hr at 08/06/24 0902, New Bag at 08/06/24 0902   0.9 %  sodium chloride  infusion, , Intravenous, Once PRN, Melanee Annah BROCKS, MD, Last Rate: 999 mL/hr at  08/06/24 1057, New Bag at 08/06/24 1057   CARBOplatin (PARAPLATIN) 180 mg in sodium chloride  0.9 % 100 mL chemo infusion, 180 mg, Intravenous, Once, Melanee Annah BROCKS, MD  Physical exam:  Vitals:   08/06/24 0830  BP: 117/74  Pulse: 76  Resp: 18  Temp: (!) 96.1 F (35.6 C)  TempSrc: Tympanic  SpO2: 100%  Weight: 186 lb 9.6 oz (84.6 kg)  Height: 5' 11 (1.803 m)   Physical Exam Cardiovascular:     Rate and Rhythm: Normal rate and regular rhythm.     Heart sounds: Normal heart sounds.  Pulmonary:     Effort: Pulmonary effort is normal.     Breath sounds: Normal breath sounds.  Skin:    General: Skin is warm and dry.  Neurological:     Mental Status: She is alert and oriented to person, place, and time.      I have personally reviewed labs listed below:    Latest Ref Rng & Units 08/06/2024    8:06 AM  CMP  Glucose 70 - 99 mg/dL 89   BUN 8 - 23 mg/dL 15   Creatinine 9.55 - 1.00 mg/dL 9.24   Sodium 864 - 854 mmol/L 140   Potassium 3.5 - 5.1 mmol/L 3.5   Chloride 98 - 111 mmol/L 107   CO2 22 - 32 mmol/L 26   Calcium 8.9 - 10.3 mg/dL 9.0   Total Protein 6.5 - 8.1 g/dL 6.7   Total Bilirubin 0.0 - 1.2 mg/dL 0.8   Alkaline Phos 38 - 126 U/L 62   AST 15 - 41 U/L 17   ALT 0 - 44 U/L 13       Latest Ref Rng & Units 08/06/2024    8:06 AM  CBC  WBC 4.0 - 10.5 K/uL 6.7   Hemoglobin 12.0 - 15.0 g/dL 87.5   Hematocrit 63.9 - 46.0 % 37.1   Platelets 150 - 400 K/uL 268    I have personally reviewed Radiology images listed below: No images are attached to the encounter.  DG CHEST PORT 1 VIEW Result Date: 08/03/2024 CLINICAL DATA:  Port-A-Cath EXAM: PORTABLE CHEST 1 VIEW COMPARISON:  04/08/2019 FINDINGS: Interim placement of right-sided central venous port with tip projecting over the SVC. No acute airspace disease or pleural effusion. Cardiomediastinal silhouette is within normal limits. No definitive pneumothorax. IMPRESSION: Interim placement of right-sided  central venous port  with tip projecting over the SVC. No definitive pneumothorax. Electronically Signed   By: Luke Bun M.D.   On: 08/03/2024 15:24   DG C-Arm 1-60 Min-No Report Result Date: 08/03/2024 Fluoroscopy was utilized by the requesting physician.  No radiographic interpretation.   ECHOCARDIOGRAM COMPLETE Result Date: 07/29/2024    ECHOCARDIOGRAM REPORT   Patient Name:   MARIANE BURPEE Date of Exam: 07/29/2024 Medical Rec #:  [id number]    Height:       71.0 in Accession #:    [id number]   Weight:       183.7 lb Date of Birth:  1961/07/19   BSA:          2.034 m Patient Age:    62 years     BP:           126/67 mmHg Patient Gender: F            HR:           89 bpm. Exam Location:  [facility] Procedure: 2D Echo, Cardiac Doppler, Color Doppler, 3D Echo and Strain Analysis            (Both Spectral and Color Flow Doppler were utilized during            procedure). Indications:     Chemo Z09  History:         Patient has no prior history of Echocardiogram examinations.                  Risk Factors:Hypertension.  Sonographer:     Christopher Furnace Referring Phys:  [id number] ANNAH BROCKS Jade Burright Diagnosing Phys: Evalene Lunger MD  Sonographer Comments: Global longitudinal strain was attempted. IMPRESSIONS  1. Left ventricular ejection fraction, by estimation, is 60 to 65%. Left ventricular ejection fraction by 3D volume is 57 %. The left ventricle has normal function. The left ventricle has no regional wall motion abnormalities. There is mild left ventricular hypertrophy. Left ventricular diastolic parameters were normal. The average left ventricular global longitudinal strain is -16.1 %. The global longitudinal strain is normal.  2. Right ventricular systolic function is normal. The right ventricular size is normal. There is normal pulmonary artery systolic pressure. The estimated right ventricular systolic pressure is 18.1 mmHg.  3. The mitral valve is normal in structure. Mild mitral valve regurgitation. No evidence of mitral stenosis.  4.  The aortic valve is normal in structure. Aortic valve regurgitation is not visualized. Aortic valve sclerosis is present, with no evidence of aortic valve stenosis.  5. The inferior vena cava is normal in size with greater than 50% respiratory variability, suggesting right atrial pressure of 3 mmHg. FINDINGS  Left Ventricle: Left ventricular ejection fraction, by estimation, is 60 to 65%. Left ventricular ejection fraction by 3D volume is 57 %. The left ventricle has normal function. The left ventricle has no regional wall motion abnormalities. The average left ventricular global longitudinal strain is -16.1 %. Strain was performed and the global longitudinal strain is normal. The left ventricular internal cavity size was normal in size. There is mild left ventricular hypertrophy. Left ventricular diastolic parameters were normal. Right Ventricle: The right ventricular size is normal. No increase in right ventricular wall thickness. Right ventricular systolic function is normal. There is normal pulmonary artery systolic pressure. The tricuspid regurgitant velocity is 1.81 m/s, and  with an assumed right atrial pressure of 5 mmHg, the estimated right ventricular systolic pressure is 18.1 mmHg. Left Atrium: Left  atrial size was normal in size. Right Atrium: Right atrial size was normal in size. Pericardium: There is no evidence of pericardial effusion. Mitral Valve: The mitral valve is normal in structure. Mild mitral valve regurgitation. No evidence of mitral valve stenosis. MV peak gradient, 3.1 mmHg. The mean mitral valve gradient is 2.0 mmHg. Tricuspid Valve: The tricuspid valve is normal in structure. Tricuspid valve regurgitation is not demonstrated. No evidence of tricuspid stenosis. Aortic Valve: The aortic valve is normal in structure. Aortic valve regurgitation is not visualized. Aortic valve sclerosis is present, with no evidence of aortic valve stenosis. Aortic valve mean gradient measures 3.0 mmHg. Aortic  valve peak gradient measures 5.0 mmHg. Aortic valve area, by VTI measures 3.20 cm. Pulmonic Valve: The pulmonic valve was normal in structure. Pulmonic valve regurgitation is not visualized. No evidence of pulmonic stenosis. Aorta: The aortic root is normal in size and structure. Venous: The inferior vena cava is normal in size with greater than 50% respiratory variability, suggesting right atrial pressure of 3 mmHg. IAS/Shunts: No atrial level shunt detected by color flow Doppler. Additional Comments: 3D was performed not requiring image post processing on an independent workstation and was normal.  LEFT VENTRICLE PLAX 2D LVIDd:         4.20 cm         Diastology LVIDs:         2.60 cm         LV e' medial:    13.10 cm/s LV PW:         0.90 cm         LV E/e' medial:  5.7 LV IVS:        1.20 cm         LV e' lateral:   18.00 cm/s LVOT diam:     2.00 cm         LV E/e' lateral: 4.2 LV SV:         64 LV SV Index:   31              2D Longitudinal LVOT Area:     3.14 cm        Strain                                2D Strain GLS   -16.1 %                                Avg:                                 3D Volume EF                                LV 3D EF:    Left                                             ventricul  ar                                             ejection                                             fraction                                             by 3D                                             volume is                                             57 %.                                 3D Volume EF:                                3D EF:        57 %                                LV EDV:       178 ml                                LV ESV:       77 ml                                LV SV:        101 ml RIGHT VENTRICLE RV Basal diam:  3.70 cm RV Mid diam:    3.00 cm RV S prime:     13.50 cm/s TAPSE (M-mode): 2.7 cm LEFT ATRIUM           Index         RIGHT ATRIUM           Index LA diam:      1.80 cm 0.89 cm/m   RA Area:     15.10 cm LA Vol (A2C): 23.1 ml 11.36 ml/m  RA Volume:   40.90 ml  20.11 ml/m LA Vol (A4C): 9.5 ml  4.68 ml/m  AORTIC VALVE AV Area (Vmax):    3.14 cm AV Area (Vmean):   3.29 cm AV Area (VTI):     3.20 cm AV Vmax:           112.00 cm/s AV Vmean:          77.900 cm/s AV VTI:  0.200 m AV Peak Grad:      5.0 mmHg AV Mean Grad:      3.0 mmHg LVOT Vmax:         112.00 cm/s LVOT Vmean:        81.600 cm/s LVOT VTI:          0.203 m LVOT/AV VTI ratio: 1.02  AORTA Ao Root diam: 2.80 cm MITRAL VALVE               TRICUSPID VALVE MV Area (PHT): 3.26 cm    TR Peak grad:   13.1 mmHg MV Area VTI:   3.30 cm    TR Vmax:        181.00 cm/s MV Peak grad:  3.1 mmHg MV Mean grad:  2.0 mmHg    SHUNTS MV Vmax:       0.88 m/s    Systemic VTI:  0.20 m MV Vmean:      61.7 cm/s   Systemic Diam: 2.00 cm MV Decel Time: 233 msec MV E velocity: 74.90 cm/s MV A velocity: 59.70 cm/s MV E/A ratio:  1.25 Evalene Lunger MD Electronically signed by Evalene Lunger MD Signature Date/Time: 07/29/2024/1:47:30 PM    Final    MR BREAST BILATERAL W WO CONTRAST INC CAD Result Date: 07/28/2024 CLINICAL DATA:  Recent diagnosis of left breast carcinoma. Assess extent of disease. EXAM: BILATERAL BREAST MRI WITH AND WITHOUT CONTRAST TECHNIQUE: Multiplanar, multisequence MR images of both breasts were obtained prior to and following the intravenous administration of 8 ml of Gadavist Three-dimensional MR images were rendered by post-processing of the original MR data on an independent workstation. The three-dimensional MR images were interpreted, and findings are reported in the following complete MRI report for this study. Three dimensional images were evaluated at the independent interpreting workstation using the DynaCAD thin client. COMPARISON:  Prior exams. FINDINGS: Breast composition: b. Scattered fibroglandular tissue. Background parenchymal enhancement: Mild  Right breast: No mass or abnormal enhancement. Left breast: Large, centrally necrotic enhancing mass with a thick irregular enhancing rim and internal complex fluid, lying in the inferior left breast at 6-7 o'clock, measuring 3.0 x 2.5 x 2.8 cm. This contains central susceptibility artifact from the post biopsy marker clip. Mass extends posteriorly in close approximation to the underlying pectoralis major muscle. There is a thin sliver of fat signal between the posterior margin of the mass and the pectoralis and there is no abnormal enhancement within the pectoralis to suggest invasion. Hazy non mass enhancement extends anteriorly from the mass spanning 2.9 cm anterior-posterior by 2.7 cm transversely and 1 cm superior to inferior. No other left breast masses or areas of abnormal enhancement. Lymph nodes: No abnormal appearing lymph nodes. Ancillary findings:  None. IMPRESSION: 1. Biopsy-proven breast carcinoma along the inferior aspect of the left breast between 6 and 7 o'clock, measuring 3 cm in greatest dimension, in close proximity to the underlying pectoralis major muscle, but without MRI evidence of muscle invasion. 2. Indeterminate non mass enhancement that extends anteriorly from the mass as detailed above. This may reflect additional malignancy. 3. No evidence of right breast malignancy. 4. No evidence of axillary lymphadenopathy. RECOMMENDATION: 1. Consider MRI guided core needle biopsy x1 of the non mass enhancement extending anteriorly from the biopsy-proven left breast carcinoma, if this would alter treatment. BI-RADS CATEGORY  6: Known biopsy-proven malignancy. Electronically Signed   By: Alm Parkins M.D.   On: 07/28/2024 13:00   US  LT BREAST BX W LOC DEV 1ST LESION  IMG BX SPEC US  GUIDE Addendum Date: 07/22/2024 ADDENDUM REPORT: 07/22/2024 12:20 ADDENDUM: PATHOLOGY revealed: Breast, left, needle core biopsy, 6 o'clock 5 cmfn (ribbon clip)- INVASIVE MAMMARY CARCINOMA- OVERALL GRADE: 2  LYMPHOVASCULAR INVASION: NOT IDENTIFIED, CANCER LENGTH: 14 MM /1.4 CM, CALCIFICATIONS: NOT IDENTIFIED. Pathology results are CONCORDANT with imaging findings, per Dr. Alm Parkins. Pathology results and recommendations were discussed with patient via telephone on 07/22/2024 by Rock Hover RN. Patient reported biopsy site doing well with no adverse symptoms, and slight tenderness at the site. Post biopsy care instructions were reviewed, questions were answered and my direct phone number was provided. Patient was instructed to call [facility] for any additional questions or concerns related to biopsy site. RECOMMENDATIONS: 1. Surgical and oncological consultation. Request for surgical and oncological consultation relayed to Shasta Ada RN at [facility] by Rock Hover RN on 07/22/2024. Pathology results reported by Mliss Molt RN 07/22/2024. Electronically Signed   By: Alm Parkins M.D.   On: 07/22/2024 12:20   Result Date: 07/22/2024 CLINICAL DATA:  Patient presents for ultrasound-guided core needle biopsy of a left breast mass. EXAM: ULTRASOUND GUIDED LEFT BREAST CORE NEEDLE BIOPSY COMPARISON:  Previous exam(s). PROCEDURE: I met with the patient and we discussed the procedure of ultrasound-guided biopsy, including benefits and alternatives. We discussed the high likelihood of a successful procedure. We discussed the risks of the procedure, including infection, bleeding, tissue injury, clip migration, and inadequate sampling. Informed written consent was given. The usual time-out protocol was performed immediately prior to the procedure. Lesion quadrant: Upper outer quadrant: Near 6 o'clock, 5 cm from the nipple. Using sterile technique and 1% Lidocaine  as local anesthetic, under direct ultrasound visualization, a 12 gauge spring-loaded device was used to perform biopsy of the 3 cm mass at 6 o'clock using a medial approach. At the conclusion of the procedure a ribbon shaped tissue  marker clip was deployed into the biopsy cavity. Follow up 2 view mammogram was performed and dictated separately. IMPRESSION: Ultrasound guided biopsy of a left breast mass. No apparent complications. Electronically Signed: By: Alm Parkins M.D. On: 07/21/2024 14:08   MM CLIP PLACEMENT LEFT Result Date: 07/21/2024 CLINICAL DATA:  Assess post biopsy marker clip placement following ultrasound-guided core needle biopsy of a left breast mass. EXAM: 3D DIAGNOSTIC LEFT MAMMOGRAM POST ULTRASOUND BIOPSY COMPARISON:  Previous exam(s). ACR Breast Density Category b: There are scattered areas of fibroglandular density. FINDINGS: 3D Mammographic images were obtained following ultrasound guided biopsy of a left breast mass. The biopsy marking clip is in expected position at the site of biopsy. IMPRESSION: Appropriate positioning of the ribbon shaped biopsy marking clip at the site of biopsy in the mass in the inferior aspect of the left breast. Final Assessment: Post Procedure Mammograms for Marker Placement Electronically Signed   By: Alm Parkins M.D.   On: 07/21/2024 14:27   MM 3D DIAGNOSTIC MAMMOGRAM BILATERAL BREAST Result Date: 07/19/2024 CLINICAL DATA:  Patient presents with a palpable lump along the inferior aspect of the left breast. EXAM: DIGITAL DIAGNOSTIC BILATERAL MAMMOGRAM WITH TOMOSYNTHESIS AND CAD; ULTRASOUND RIGHT BREAST LIMITED; ULTRASOUND LEFT BREAST LIMITED TECHNIQUE: Bilateral digital diagnostic mammography and breast tomosynthesis was performed. The images were evaluated with computer-aided detection. ; Targeted ultrasound examination of the right breast was performed; Targeted ultrasound examination of the left breast was performed. COMPARISON:  Previous exam(s). ACR Breast Density Category b: There are scattered areas of fibroglandular density. FINDINGS: Left breast: There is irregular mass along the inferior aspect  of the left breast, posterior margin which is excluded from the mammographic  field of view. This mass corresponds to the palpable mass. There are no other left breast masses and no suspicious calcifications. Right breast: There is a small, round, circumscribed mass in the medial breast at 2-3 o'clock measuring 6 mm in size. No other right breast masses, no areas of architectural distortion and no suspicious calcifications. On physical exam, there is a firm mass in the inferior aspect left breast near 6 o'clock that bulges the contour of the overlying skin. Targeted left breast ultrasound is performed, showing an irregular hypoechoic vascular mass in the 6 o'clock position of the left breast, middle to posterior depth, measuring 3.0 x 2.4 x 2.9 cm. This corresponds to the palpable mass in the mammographic mass. Sonographic imaging left axilla demonstrates normal lymph nodes. No abnormal nodes. Targeted right breast ultrasound is performed, showing a simple oval cyst at 2:30 o'clock, 6 cm the nipple, measuring 6 x 3 x 5 mm, consistent in size, shape and location to the mammographic mass. No solid mass or suspicious finding. IMPRESSION: 1. 3 cm highly suspicious mass in the left breast axis o'clock corresponding to the palpable lump. No left axillary lymphadenopathy. 2. Small benign right breast cyst. No evidence of right breast malignancy. RECOMMENDATION: 1. Ultrasound-guided core needle biopsy of the left breast mass. This procedure was scheduled prior to the patient leaving the [facility] center. I have discussed the findings and recommendations with the patient. If applicable, a reminder letter will be sent to the patient regarding the next appointment. BI-RADS CATEGORY  5: Highly suggestive of malignancy. Electronically Signed   By: Alm Parkins M.D.   On: 07/19/2024 10:53   US  LIMITED ULTRASOUND INCLUDING AXILLA LEFT BREAST  Result Date: 07/19/2024 CLINICAL DATA:  Patient presents with a palpable lump along the inferior aspect of the left breast. EXAM: DIGITAL DIAGNOSTIC  BILATERAL MAMMOGRAM WITH TOMOSYNTHESIS AND CAD; ULTRASOUND RIGHT BREAST LIMITED; ULTRASOUND LEFT BREAST LIMITED TECHNIQUE: Bilateral digital diagnostic mammography and breast tomosynthesis was performed. The images were evaluated with computer-aided detection. ; Targeted ultrasound examination of the right breast was performed; Targeted ultrasound examination of the left breast was performed. COMPARISON:  Previous exam(s). ACR Breast Density Category b: There are scattered areas of fibroglandular density. FINDINGS: Left breast: There is irregular mass along the inferior aspect of the left breast, posterior margin which is excluded from the mammographic field of view. This mass corresponds to the palpable mass. There are no other left breast masses and no suspicious calcifications. Right breast: There is a small, round, circumscribed mass in the medial breast at 2-3 o'clock measuring 6 mm in size. No other right breast masses, no areas of architectural distortion and no suspicious calcifications. On physical exam, there is a firm mass in the inferior aspect left breast near 6 o'clock that bulges the contour of the overlying skin. Targeted left breast ultrasound is performed, showing an irregular hypoechoic vascular mass in the 6 o'clock position of the left breast, middle to posterior depth, measuring 3.0 x 2.4 x 2.9 cm. This corresponds to the palpable mass in the mammographic mass. Sonographic imaging left axilla demonstrates normal lymph nodes. No abnormal nodes. Targeted right breast ultrasound is performed, showing a simple oval cyst at 2:30 o'clock, 6 cm the nipple, measuring 6 x 3 x 5 mm, consistent in size, shape and location to the mammographic mass. No solid mass or suspicious finding. IMPRESSION: 1. 3 cm highly suspicious mass in  the left breast axis o'clock corresponding to the palpable lump. No left axillary lymphadenopathy. 2. Small benign right breast cyst. No evidence of right breast malignancy.  RECOMMENDATION: 1. Ultrasound-guided core needle biopsy of the left breast mass. This procedure was scheduled prior to the patient leaving the [facility] center. I have discussed the findings and recommendations with the patient. If applicable, a reminder letter will be sent to the patient regarding the next appointment. BI-RADS CATEGORY  5: Highly suggestive of malignancy. Electronically Signed   By: Alm Parkins M.D.   On: 07/19/2024 10:53   US  LIMITED ULTRASOUND INCLUDING AXILLA RIGHT BREAST Result Date: 07/19/2024 CLINICAL DATA:  Patient presents with a palpable lump along the inferior aspect of the left breast. EXAM: DIGITAL DIAGNOSTIC BILATERAL MAMMOGRAM WITH TOMOSYNTHESIS AND CAD; ULTRASOUND RIGHT BREAST LIMITED; ULTRASOUND LEFT BREAST LIMITED TECHNIQUE: Bilateral digital diagnostic mammography and breast tomosynthesis was performed. The images were evaluated with computer-aided detection. ; Targeted ultrasound examination of the right breast was performed; Targeted ultrasound examination of the left breast was performed. COMPARISON:  Previous exam(s). ACR Breast Density Category b: There are scattered areas of fibroglandular density. FINDINGS: Left breast: There is irregular mass along the inferior aspect of the left breast, posterior margin which is excluded from the mammographic field of view. This mass corresponds to the palpable mass. There are no other left breast masses and no suspicious calcifications. Right breast: There is a small, round, circumscribed mass in the medial breast at 2-3 o'clock measuring 6 mm in size. No other right breast masses, no areas of architectural distortion and no suspicious calcifications. On physical exam, there is a firm mass in the inferior aspect left breast near 6 o'clock that bulges the contour of the overlying skin. Targeted left breast ultrasound is performed, showing an irregular hypoechoic vascular mass in the 6 o'clock position of the left  breast, middle to posterior depth, measuring 3.0 x 2.4 x 2.9 cm. This corresponds to the palpable mass in the mammographic mass. Sonographic imaging left axilla demonstrates normal lymph nodes. No abnormal nodes. Targeted right breast ultrasound is performed, showing a simple oval cyst at 2:30 o'clock, 6 cm the nipple, measuring 6 x 3 x 5 mm, consistent in size, shape and location to the mammographic mass. No solid mass or suspicious finding. IMPRESSION: 1. 3 cm highly suspicious mass in the left breast axis o'clock corresponding to the palpable lump. No left axillary lymphadenopathy. 2. Small benign right breast cyst. No evidence of right breast malignancy. RECOMMENDATION: 1. Ultrasound-guided core needle biopsy of the left breast mass. This procedure was scheduled prior to the patient leaving the [facility] center. I have discussed the findings and recommendations with the patient. If applicable, a reminder letter will be sent to the patient regarding the next appointment. BI-RADS CATEGORY  5: Highly suggestive of malignancy. Electronically Signed   By: Alm Parkins M.D.   On: 07/19/2024 10:53     Assessment and plan- Patient is a 63 y.o. 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 No lymph node involvement. MRI shows enhancement in the anterior aspect of the mass, requiring further evaluation.  I will plan for MR guided biopsy of this enhancement. - Administered  first dose of carbotaxol today; continue weekly for 12 weeks. - Scheduled follow-up appointments every other week for treatment monitoring. - Will perform breast ultrasound after 12 weeks of treatment to assess response. - Will proceed with second half of treatment (AC  regimen) after 12 weeks if response is adequate. - She will also get Keytruda today which she will receive every 3 weeks. - Discussed acetaminophens of chemotherapy including all but not limited to nausea vomiting low blood counts risk of infections and hospitalization.  Risk of peripheral neuropathy associated with CarboTaxol chemotherapy.  Patient understands and agrees to proceed as planned    Visit Diagnosis 1. Malignant neoplasm of lower-outer quadrant of left breast of female, estrogen receptor negative (HCC)   2. Encounter for antineoplastic chemotherapy   3. Encounter for antineoplastic immunotherapy      Dr. Annah Skene, MD, MPH [facility] [phone] 08/06/2024 12:23 PM

## 2024-08-06 NOTE — Telephone Encounter (Signed)
 Completed paperwork has been faxed to [employer]

## 2024-08-06 NOTE — Progress Notes (Signed)
 Amber Figueroa okd to proceed with keytruda 08/06/24

## 2024-08-06 NOTE — Telephone Encounter (Signed)
 Copy provided to patient during infusion today.

## 2024-08-07 LAB — T4: T4, Total: 8 ug/dL (ref 4.5–12.0)

## 2024-08-08 ENCOUNTER — Other Ambulatory Visit: Payer: Self-pay

## 2024-08-09 ENCOUNTER — Ambulatory Visit (INDEPENDENT_AMBULATORY_CARE_PROVIDER_SITE_OTHER): Admitting: Internal Medicine

## 2024-08-09 ENCOUNTER — Encounter: Payer: Self-pay | Admitting: *Deleted

## 2024-08-09 ENCOUNTER — Encounter: Payer: Self-pay | Admitting: Internal Medicine

## 2024-08-09 ENCOUNTER — Telehealth: Payer: Self-pay

## 2024-08-09 ENCOUNTER — Other Ambulatory Visit: Payer: Self-pay | Admitting: Oncology

## 2024-08-09 VITALS — BP 122/72 | Ht 71.0 in | Wt 184.8 lb

## 2024-08-09 DIAGNOSIS — E7800 Pure hypercholesterolemia, unspecified: Secondary | ICD-10-CM

## 2024-08-09 DIAGNOSIS — C50512 Malignant neoplasm of lower-outer quadrant of left female breast: Secondary | ICD-10-CM | POA: Diagnosis not present

## 2024-08-09 DIAGNOSIS — D649 Anemia, unspecified: Secondary | ICD-10-CM | POA: Diagnosis not present

## 2024-08-09 DIAGNOSIS — E663 Overweight: Secondary | ICD-10-CM

## 2024-08-09 DIAGNOSIS — I890 Lymphedema, not elsewhere classified: Secondary | ICD-10-CM

## 2024-08-09 DIAGNOSIS — Z6825 Body mass index (BMI) 25.0-25.9, adult: Secondary | ICD-10-CM

## 2024-08-09 DIAGNOSIS — I10 Essential (primary) hypertension: Secondary | ICD-10-CM

## 2024-08-09 DIAGNOSIS — Z171 Estrogen receptor negative status [ER-]: Secondary | ICD-10-CM

## 2024-08-09 NOTE — Progress Notes (Signed)
 Subjective:    Patient ID: Amber Figueroa, female    DOB: 29-May-1961, 63 y.o.   MRN: [id number]  HPI  Patient presents to clinic today for follow-up of chronic conditions.  Lymphedema: She is not currently taking any medications for this but is wearing TED hose.  She has seen vascular in the past for the same.  HTN: Her BP today is 122/72.  She is taking amlodipine  as prescribed.  ECG from 03/2024 reviewed.  Anemia: Her last H/H was 12.4/37.1, 07/2024.  She is not taking any oral iron at this time.  She does not follow with hematology.  HLD: Her last LDL was 111, triglycerides 47, 01/2024.  She is not taking any cholesterol-lowering medication at this time.  She does not consume a low-fat diet.  Left breast cancer: Recent diagnosis. She is currently undergoing chemo, then will consider surgery and radiation.  She is following with oncology.  Review of Systems     Past Medical History:  Diagnosis Date   Back pain    Complication of anesthesia    slow to wake up with the knee surgery   GERD (gastroesophageal reflux disease)    Hypertension    Malignant neoplasm of lower-outer quadrant of left breast of female, estrogen receptor negative (HCC) 06/2024    Current Outpatient Medications  Medication Sig Dispense Refill   albuterol  (VENTOLIN  HFA) 108 (90 Base) MCG/ACT inhaler Inhale into the lungs every 6 (six) hours as needed for wheezing or shortness of breath.     amLODipine  (NORVASC ) 10 MG tablet TAKE 1 TABLET BY MOUTH EVERY DAY 90 tablet 0   aspirin-sod bicarb-citric acid (ALKA-SELTZER) 325 MG TBEF tablet Take 325 mg by mouth every 6 (six) hours as needed.     dexamethasone (DECADRON) 4 MG tablet Take 2 tablets daily for 2 days, start the day after chemotherapy. Take with food. 30 tablet 1   HYDROcodone -acetaminophen  (NORCO/VICODIN) 5-325 MG tablet Take 1 tablet by mouth every 6 (six) hours as needed for moderate pain (pain score 4-6). 12 tablet 0   lidocaine -prilocaine (EMLA)  cream Apply to affected area once 30 g 3   ondansetron  (ZOFRAN ) 8 MG tablet Take 1 tablet (8 mg total) by mouth every 8 (eight) hours as needed for nausea or vomiting. Start on the third day after chemotherapy. 30 tablet 1   prochlorperazine (COMPAZINE) 10 MG tablet Take 1 tablet (10 mg total) by mouth every 6 (six) hours as needed for nausea or vomiting. 30 tablet 1   No current facility-administered medications for this visit.    No Known Allergies  Family History  Problem Relation Age of Onset   Cancer Father        unknown type   Hypertension Sister    Hypertension Brother    Hyperlipidemia Brother    Hypertension Brother    Diabetes Brother    Hypertension Brother    Breast cancer Maternal Grandmother        likely breast ca - mastectomy   Heart attack Neg Hx    Stroke Neg Hx    Colon cancer Neg Hx    Ovarian cancer Neg Hx     Social History   Socioeconomic History   Marital status: Legally Separated    Spouse name: Not on file   Number of children: 2   Years of education: Not on file   Highest education level: Not on file  Occupational History   Occupation: warehouse  Tobacco Use   Smoking status:  Every Day    Current packs/day: 1.00    Average packs/day: 1 pack/day for 1 year (1.0 ttl pk-yrs)    Types: Cigarettes    Passive exposure: Past   Smokeless tobacco: Never   Tobacco comments:    age [age] smoked 1/2 ppd, quit x 20 years, now smoking 1 year  Vaping Use   Vaping status: Never Used  Substance and Sexual Activity   Alcohol use: No   Drug use: No   Sexual activity: Never  Other Topics Concern   Not on file  Social History Narrative   Lives with son and grandbaby   Social Drivers of Health   Financial Resource Strain: Not on file  Food Insecurity: No Food Insecurity (07/27/2024)   Hunger Vital Sign    Worried About Running Out of Food in the Last Year: Never true    Ran Out of Food in the Last Year: Never true  Transportation Needs: No  Transportation Needs (07/27/2024)   PRAPARE - Administrator, Civil Service (Medical): No    Lack of Transportation (Non-Medical): No  Physical Activity: Not on file  Stress: Not on file  Social Connections: Not on file  Intimate Partner Violence: Not At Risk (07/27/2024)   Humiliation, Afraid, Rape, and Kick questionnaire    Fear of Current or Ex-Partner: No    Emotionally Abused: No    Physically Abused: No    Sexually Abused: No     Constitutional: Denies fever, malaise, fatigue, headache or abrupt weight changes.  HEENT: Denies eye pain, eye redness, ear pain, ringing in the ears, wax buildup, runny nose, nasal congestion, bloody nose, or sore throat. Respiratory: Denies difficulty breathing, shortness of breath, cough or sputum production.   Cardiovascular: Patient reports swelling in legs.  Denies chest pain, chest tightness, palpitations or swelling in the hands.  Gastrointestinal: Denies abdominal pain, bloating, constipation, diarrhea or blood in the stool.  GU: Denies urgency, frequency, pain with urination, burning sensation, blood in urine, odor or discharge. Musculoskeletal: Denies decrease in range of motion, difficulty with gait, muscle pain or joint pain or swelling.  Skin: Pt reports mass of left breast. Denies redness, rashes, lesions or ulcercations.  Neurological: Denies dizziness, difficulty with memory, difficulty with speech or problems with balance and coordination.  Psych: Denies anxiety, depression, SI/HI.  No other specific complaints in a complete review of systems (except as listed in HPI above).  Objective:   Physical Exam BP 122/72 (BP Location: Right Arm, Patient Position: Sitting, Cuff Size: Normal)   Ht 5' 11 (1.803 m)   Wt 184 lb 12.8 oz (83.8 kg)   LMP 05/22/2009   BMI 25.77 kg/m     Wt Readings from Last 3 Encounters:  08/06/24 186 lb 9.6 oz (84.6 kg)  08/02/24 187 lb (84.8 kg)  07/27/24 183 lb 11.2 oz (83.3 kg)     General: Appears her stated age, well developed, well nourished in NAD. Skin: Warm, dry and intact.  HEENT: Head: normal shape and size; Eyes: sclera white, no icterus, conjunctiva pink, PERRLA and EOMs intact;  Cardiovascular: Normal rate and rhythm. S1,S2 noted.  No murmur, rubs or gallops noted. Trace BLE edema. No carotid bruits noted. Pulmonary/Chest: Normal effort and positive vesicular breath sounds. No respiratory distress. No wheezes, rales or ronchi noted.  Musculoskeletal:  No difficulty with gait.  Neurological: Alert and oriented.  Coordination normal.    BMET    Component Value Date/Time   NA 140 08/06/2024 0806  K 3.5 08/06/2024 0806   CL 107 08/06/2024 0806   CO2 26 08/06/2024 0806   GLUCOSE 89 08/06/2024 0806   BUN 15 08/06/2024 0806   CREATININE 0.75 08/06/2024 0806   CREATININE 0.84 02/06/2024 1333   CALCIUM 9.0 08/06/2024 0806   GFRNONAA >60 08/06/2024 0806   GFRNONAA 89 06/09/2017 0832   GFRAA >60 04/08/2019 0226   GFRAA 103 06/09/2017 0832    Lipid Panel     Component Value Date/Time   CHOL 187 02/06/2024 1333   TRIG 47 02/06/2024 1333   HDL 62 02/06/2024 1333   CHOLHDL 3.0 02/06/2024 1333   LDLCALC 111 (H) 02/06/2024 1333    CBC    Component Value Date/Time   WBC 6.7 08/06/2024 0806   WBC 9.2 04/15/2024 0445   RBC 4.30 08/06/2024 0806   HGB 12.4 08/06/2024 0806   HCT 37.1 08/06/2024 0806   PLT 268 08/06/2024 0806   MCV 86.3 08/06/2024 0806   MCH 28.8 08/06/2024 0806   MCHC 33.4 08/06/2024 0806   RDW 14.2 08/06/2024 0806   LYMPHSABS 1.9 08/06/2024 0806   MONOABS 0.5 08/06/2024 0806   EOSABS 0.2 08/06/2024 0806   BASOSABS 0.0 08/06/2024 0806    Hgb A1C Lab Results  Component Value Date   HGBA1C 5.5 02/06/2024            Assessment & Plan:     RTC in 6 months for your annual exam Angeline Laura, NP

## 2024-08-09 NOTE — Assessment & Plan Note (Signed)
 Encouraged diet and exercise for weight loss ?

## 2024-08-09 NOTE — Assessment & Plan Note (Signed)
Continue use of TEDs Encourage elevation

## 2024-08-09 NOTE — Assessment & Plan Note (Signed)
 Following with oncology

## 2024-08-09 NOTE — Assessment & Plan Note (Signed)
 CBC reviewed Currently following with oncology

## 2024-08-09 NOTE — Assessment & Plan Note (Signed)
Will check lipid profile at annual exam Encouraged her to consume a low fat diet

## 2024-08-09 NOTE — Assessment & Plan Note (Signed)
 Controlled on amlodipine  10 mg daily Reinforced DASH diet Kidney function reviewed We will monitor

## 2024-08-09 NOTE — Telephone Encounter (Signed)
Telephone call to patient for follow up after receiving first infusion.   Patient states infusion went great.  States eating good and drinking plenty of fluids.   Denies any nausea or vomiting.  Encouraged patient to call for any concerns or questions. 

## 2024-08-09 NOTE — Patient Instructions (Signed)

## 2024-08-11 ENCOUNTER — Other Ambulatory Visit: Payer: Self-pay

## 2024-08-13 ENCOUNTER — Inpatient Hospital Stay

## 2024-08-13 ENCOUNTER — Other Ambulatory Visit: Payer: Self-pay | Admitting: Oncology

## 2024-08-13 ENCOUNTER — Inpatient Hospital Stay: Admitting: Oncology

## 2024-08-13 ENCOUNTER — Encounter: Payer: Self-pay | Admitting: Oncology

## 2024-08-13 VITALS — BP 117/73 | HR 82 | Temp 96.0°F | Resp 19 | Wt 181.5 lb

## 2024-08-13 DIAGNOSIS — C50512 Malignant neoplasm of lower-outer quadrant of left female breast: Secondary | ICD-10-CM

## 2024-08-13 DIAGNOSIS — Z5112 Encounter for antineoplastic immunotherapy: Secondary | ICD-10-CM | POA: Diagnosis not present

## 2024-08-13 LAB — CMP (CANCER CENTER ONLY)
ALT: 15 U/L (ref 0–44)
AST: 20 U/L (ref 15–41)
Albumin: 3.6 g/dL (ref 3.5–5.0)
Alkaline Phosphatase: 61 U/L (ref 38–126)
Anion gap: 8 (ref 5–15)
BUN: 13 mg/dL (ref 8–23)
CO2: 25 mmol/L (ref 22–32)
Calcium: 8.8 mg/dL — ABNORMAL LOW (ref 8.9–10.3)
Chloride: 106 mmol/L (ref 98–111)
Creatinine: 0.74 mg/dL (ref 0.44–1.00)
GFR, Estimated: >60 mL/min (ref >60)
Glucose, Bld: 103 mg/dL — ABNORMAL HIGH (ref 70–99)
Potassium: 3.8 mmol/L (ref 3.5–5.1)
Sodium: 139 mmol/L (ref 135–145)
Total Bilirubin: 0.6 mg/dL (ref 0.0–1.2)
Total Protein: 6.3 g/dL — ABNORMAL LOW (ref 6.5–8.1)

## 2024-08-13 LAB — CBC WITH DIFFERENTIAL (CANCER CENTER ONLY)
Abs Immature Granulocytes: 0.03 K/uL (ref 0.00–0.07)
Basophils Absolute: 0 K/uL (ref 0.0–0.1)
Basophils Relative: 1 %
Eosinophils Absolute: 0.2 K/uL (ref 0.0–0.5)
Eosinophils Relative: 4 %
HCT: 35.7 % — ABNORMAL LOW (ref 36.0–46.0)
Hemoglobin: 12 g/dL (ref 12.0–15.0)
Immature Granulocytes: 1 %
Lymphocytes Relative: 28 %
Lymphs Abs: 1.3 K/uL (ref 0.7–4.0)
MCH: 28.9 pg (ref 26.0–34.0)
MCHC: 33.6 g/dL (ref 30.0–36.0)
MCV: 86 fL (ref 80.0–100.0)
Monocytes Absolute: 0.3 K/uL (ref 0.1–1.0)
Monocytes Relative: 6 %
Neutro Abs: 2.8 K/uL (ref 1.7–7.7)
Neutrophils Relative %: 60 %
Platelet Count: 268 K/uL (ref 150–400)
RBC: 4.15 MIL/uL (ref 3.87–5.11)
RDW: 13.9 % (ref 11.5–15.5)
WBC Count: 4.6 K/uL (ref 4.0–10.5)
nRBC: 0 % (ref 0.0–0.2)

## 2024-08-13 MED ORDER — FAMOTIDINE IN NACL 20-0.9 MG/50ML-% IV SOLN
20.0000 mg | Freq: Once | INTRAVENOUS | Status: AC
  Administered 2024-08-13: 20 mg via INTRAVENOUS
  Filled 2024-08-13: qty 50

## 2024-08-13 MED ORDER — SODIUM CHLORIDE 0.9 % IV SOLN
INTRAVENOUS | Status: DC
  Filled 2024-08-13: qty 250

## 2024-08-13 MED ORDER — DEXAMETHASONE SOD PHOSPHATE PF 10 MG/ML IJ SOLN
10.0000 mg | Freq: Once | INTRAMUSCULAR | Status: AC
  Administered 2024-08-13: 10 mg via INTRAVENOUS

## 2024-08-13 MED ORDER — DIPHENHYDRAMINE HCL 50 MG/ML IJ SOLN
50.0000 mg | Freq: Once | INTRAMUSCULAR | Status: AC
  Administered 2024-08-13: 50 mg via INTRAVENOUS
  Filled 2024-08-13: qty 1

## 2024-08-13 MED ORDER — PALONOSETRON HCL INJECTION 0.25 MG/5ML
0.2500 mg | Freq: Once | INTRAVENOUS | Status: AC
  Administered 2024-08-13: 0.25 mg via INTRAVENOUS
  Filled 2024-08-13: qty 5

## 2024-08-13 MED ORDER — SODIUM CHLORIDE 0.9 % IV SOLN
80.0000 mg/m2 | Freq: Once | INTRAVENOUS | Status: AC
  Administered 2024-08-13: 162 mg via INTRAVENOUS
  Filled 2024-08-13: qty 27

## 2024-08-13 MED ORDER — SODIUM CHLORIDE 0.9 % IV SOLN: 179.5500 mg | Freq: Once | INTRAVENOUS | Status: DC

## 2024-08-13 MED ORDER — SODIUM CHLORIDE 0.9 % IV SOLN
179.5500 mg | Freq: Once | INTRAVENOUS | Status: AC
  Administered 2024-08-13: 180 mg via INTRAVENOUS
  Filled 2024-08-13: qty 17.73

## 2024-08-16 ENCOUNTER — Ambulatory Visit
Admission: RE | Admit: 2024-08-16 | Discharge: 2024-08-16 | Disposition: A | Discharge status: Home / Self Care | Source: Ambulatory Visit | Attending: Oncology | Admitting: Oncology

## 2024-08-16 ENCOUNTER — Ambulatory Visit: Payer: Self-pay | Admitting: Oncology

## 2024-08-16 ENCOUNTER — Ambulatory Visit
Admission: RE | Admit: 2024-08-16 | Discharge: 2024-08-16 | Disposition: A | Source: Ambulatory Visit | Attending: Oncology

## 2024-08-16 DIAGNOSIS — R928 Other abnormal and inconclusive findings on diagnostic imaging of breast: Secondary | ICD-10-CM

## 2024-08-16 DIAGNOSIS — C50512 Malignant neoplasm of lower-outer quadrant of left female breast: Secondary | ICD-10-CM

## 2024-08-16 MED ORDER — GADOPICLENOL 0.5 MMOL/ML IV SOLN
8.0000 mL | Freq: Once | INTRAVENOUS | Status: AC | PRN
  Administered 2024-08-16: 8 mL via INTRAVENOUS

## 2024-08-17 LAB — SURGICAL PATHOLOGY

## 2024-08-18 ENCOUNTER — Inpatient Hospital Stay

## 2024-08-18 ENCOUNTER — Inpatient Hospital Stay: Admitting: Occupational Therapy

## 2024-08-18 ENCOUNTER — Encounter: Payer: Self-pay | Admitting: Licensed Clinical Social Worker

## 2024-08-18 ENCOUNTER — Inpatient Hospital Stay: Admitting: Licensed Clinical Social Worker

## 2024-08-18 DIAGNOSIS — C50512 Malignant neoplasm of lower-outer quadrant of left female breast: Secondary | ICD-10-CM

## 2024-08-18 DIAGNOSIS — Z1379 Encounter for other screening for genetic and chromosomal anomalies: Secondary | ICD-10-CM | POA: Insufficient documentation

## 2024-08-18 NOTE — Progress Notes (Signed)
 REFERRING PROVIDER: Melanee Annah BROCKS, MD [address] [place],  [place] [address]  PRIMARY PROVIDER:  Antonette Angeline ORN, NP  PRIMARY REASON FOR VISIT:  1. Genetic testing   2. Malignant neoplasm of lower-outer quadrant of left breast of female, estrogen receptor negative (HCC)      HISTORY OF PRESENT ILLNESS:   Amber Figueroa, a 63 y.o. female, was seen for a [facility] cancer genetics consultation at the request of Dr. Melanee due to a personal history of triple negative breast cancer.  Amber Figueroa presents to clinic today to discuss the possibility of a hereditary predisposition to cancer, genetic testing, and to further clarify her future cancer risks, as well as potential cancer risks for family members.   CANCER HISTORY:  Oncology History  Malignant neoplasm of lower-outer quadrant of left breast of female, estrogen receptor negative (HCC)  07/27/2024 Initial Diagnosis   Malignant neoplasm of lower-outer quadrant of left breast of female, estrogen receptor negative (HCC)   07/27/2024 Cancer Staging   Staging form: Breast, AJCC 8th Edition - Clinical stage from 07/27/2024: Stage IIB (cT2, cN0, cM0, G2, ER-, PR-, HER2-) - Signed by Melanee Annah BROCKS, MD on 07/27/2024 Stage prefix: Initial diagnosis Histologic grading system: 3 grade system   08/03/2024 Genetic Testing   Negative genetic testing. No pathogenic variants identified on the [facility] CancerNext+RNA Panel. The report date is 08/03/2024.  The [facility] Cancer[facility]+RNAinsight Panel includes sequencing, rearrangement analysis, and RNA analysis for the following 40 genes: APC, ATM, BAP1, BARD1, BMPR1A, BRCA1, BRCA2, BRIP1, CDH1, CDKN2A, CHEK2, FH, FLCN, MET, MLH1, MSH2, MSH6, MUTYH, NF1, NTHL1, PALB2, PMS2, PTEN, RAD51C, RAD51D, RPS20, SMAD4, STK11, TP53, TSC1, TSC2, and VHL (sequencing and deletion/duplication); AXIN2, HOXB13, MBD4, MSH3, POLD1 and POLE (sequencing only); EPCAM and GREM1 (deletion/duplication only).   08/06/2024 -  Chemotherapy    Patient is on Treatment Plan : BREAST Pembrolizumab (200) D1 + Carboplatin (1.5) D1,8,15 + Paclitaxel (80) D1,8,15 q21d X 4 cycles / Pembrolizumab (200) D1 + AC D1 q21d x 4 cycles       RELEVANT MEDICAL HISTORY:  Menarche was at age 77 .  First live birth at age 77.  Ovaries intact: yes.  Hysterectomy: no.  Menopausal status: postmenopausal.  Colonoscopy: yes; normal. Mammogram within the last year: yes.  Past Medical History:  Diagnosis Date   Back pain    Complication of anesthesia    slow to wake up with the knee surgery   GERD (gastroesophageal reflux disease)    Hypertension    Malignant neoplasm of lower-outer quadrant of left breast of female, estrogen receptor negative (HCC) 06/2024    Past Surgical History:  Procedure Laterality Date   BREAST BIOPSY Left 07/21/2024   US  LT BREAST BX W LOC DEV 1ST LESION IMG BX SPEC US  GUIDE 07/21/2024 [facility]   COLONOSCOPY WITH PROPOFOL  N/A 07/21/2017   Procedure: COLONOSCOPY WITH PROPOFOL ;  Surgeon: Therisa Bi, MD;  Location: [facility] ENDOSCOPY;  Service: Gastroenterology;  Laterality: N/A;   KNEE SURGERY Left    arthroscopic   PORTACATH PLACEMENT N/A 08/03/2024   Procedure: INSERTION, TUNNELED CENTRAL VENOUS DEVICE, WITH PORT;  Surgeon: Jordis Laneta FALCON, MD;  Location: [facility] ORS;  Service: General;  Laterality: N/A;    FAMILY HISTORY:  We obtained a detailed, 4-generation family history.  Significant diagnoses are listed below: Family History  Problem Relation Age of Onset   Cancer Mother    Bone cancer Father        unknown type   Hypertension Sister  Hypertension Brother    Hyperlipidemia Brother    Hypertension Brother    Diabetes Brother    Hypertension Brother    Breast cancer Maternal Grandmother        likely breast ca - mastectomy   Heart attack Neg Hx    Stroke Neg Hx    Colon cancer Neg Hx    Ovarian cancer Neg Hx     Amber Figueroa is unaware of previous family history of genetic testing for hereditary  cancer risks. There is no reported Ashkenazi Jewish ancestry. There is no known consanguinity.  GENETIC COUNSELING ASSESSMENT: Amber Figueroa is a 63 y.o. female with a personal history of triple negative breast cancer which is somewhat suggestive of a hereditary cancer syndrome and predisposition to cancer. We, therefore, discussed and recommended the following at today's visit.   DISCUSSION: We discussed that, in general, most cancer is not inherited in families, but instead is sporadic or familial. Sporadic cancers occur by chance and typically happen at older ages (>50 years). We discussed that approximately 10% of breast cancer is hereditary. Most cases of hereditary breast cancer are associated with BRCA1/BRCA2 genes, although there are other genes associated with hereditary  cancer as well. Cancers and risks are gene specific. We discussed that testing is beneficial for several reasons including knowing about cancer risks, identifying potential screening and risk-reduction options that may be appropriate, and to understand if other family members could be at risk for cancer and allow them to undergo genetic testing.   We reviewed the characteristics, features and inheritance patterns of hereditary cancer syndromes. We also discussed genetic testing, including the appropriate family members to test, the process of testing, insurance coverage and turn-around-time for results. We discussed the implications of a negative, positive and/or variant of uncertain significant result. We recommended Amber Figueroa pursue genetic testing for the [facility] CancerNext+RNA gene panel. She had blood drawn for this in October.   GENETIC TEST RESULTS:  The [facility] CancerNext+RNA Panel found no pathogenic mutations.   The [facility] CancerNext+RNAinsight Panel includes sequencing, rearrangement analysis, and RNA analysis for the following 40 genes: APC, ATM, BAP1, BARD1, BMPR1A, BRCA1, BRCA2, BRIP1, CDH1, CDKN2A, CHEK2, FH, FLCN, MET,  MLH1, MSH2, MSH6, MUTYH, NF1, NTHL1, PALB2, PMS2, PTEN, RAD51C, RAD51D, RPS20, SMAD4, STK11, TP53, TSC1, TSC2, and VHL (sequencing and deletion/duplication); AXIN2, HOXB13, MBD4, MSH3, POLD1 and POLE (sequencing only); EPCAM and GREM1 (deletion/duplication only).  The test report has been scanned into EPIC and is located under the Molecular Pathology section of the Results Review tab.  A portion of the result report is included below for reference. Genetic testing reported out on 08/03/2024.        Even though a pathogenic variant was not identified, possible explanations for the cancer in the family may include: There may be no hereditary risk for cancer in the family. The cancers in Amber Figueroa and/or her family may be sporadic/familial or due to other genetic and environmental factors. There may be a gene mutation in one of these genes that current testing methods cannot detect but that chance is small. There could be another gene that has not yet been discovered, or that we have not yet tested, that is responsible for the cancer diagnoses in the family.  It is also possible there is a hereditary cause for the cancer in the family that Amber Figueroa did not inherit.  Therefore, it is important to remain in touch with cancer genetics in the future so that we can continue  to offer Amber Figueroa the most up to date genetic testing.   ADDITIONAL GENETIC TESTING:  We discussed with Amber Figueroa that her genetic testing was fairly extensive.  If there are additional relevant genes identified to increase cancer risk that can be analyzed in the future, we would be happy to discuss and coordinate this testing at that time.    CANCER SCREENING RECOMMENDATIONS:  Ms. Sciarra test result is considered negative (normal).  This means that we have not identified a hereditary cause for her personal history of cancer at this time.   An individual's cancer risk and medical management are not determined by genetic test  results alone. Overall cancer risk assessment incorporates additional factors, including personal medical history, family history, and any available genetic information that may result in a personalized plan for cancer prevention and surveillance. Therefore, it is recommended she continue to follow the cancer management and screening guidelines provided by her oncology and primary healthcare provider.  RECOMMENDATIONS FOR FAMILY MEMBERS:   Since she did not inherit a identifiable mutation in a cancer predisposition gene included on this panel, her children could not have inherited a known mutation from her in one of these genes. Individuals in this family might be at some increased risk of developing cancer, over the general population risk, due to the family history of cancer.  Individuals in the family should notify their providers of the family history of cancer. We recommend women in this family have a yearly mammogram beginning at age 83, or 64 years younger than the earliest onset of cancer, an annual clinical breast exam, and perform monthly breast self-exams.  Family members should have colonoscopies by at age 24, or earlier, as recommended by their providers.  FOLLOW-UP:  Lastly, we discussed with Amber Figueroa that cancer genetics is a rapidly advancing field and it is possible that new genetic tests will be appropriate for her and/or her family members in the future. We encouraged her to remain in contact with cancer genetics on an annual basis so we can update her personal and family histories and let her know of advances in cancer genetics that may benefit this family.   Our contact number was provided. Amber Figueroa questions were answered to her satisfaction, and she knows she is welcome to call us  at anytime with additional questions or concerns.   Dena Cary, MS, [facility] Genetic Counselor [email or web address] Phone: [phone]  I personally spent a total of 40 minutes in the  care of the patient today including counseling and educating, documenting clinical information in the EHR, and communicating results. Dr. Delinda was available for discussion regarding this case.   _______________________________________________________________________ For Office Staff:  Number of people involved in session: 2; Patient's sister was present Was an Intern/ student involved with case: no

## 2024-08-20 ENCOUNTER — Inpatient Hospital Stay: Admitting: Oncology

## 2024-08-20 ENCOUNTER — Inpatient Hospital Stay

## 2024-08-20 ENCOUNTER — Encounter: Payer: Self-pay | Admitting: Oncology

## 2024-08-20 VITALS — BP 125/70 | HR 85 | Temp 95.8°F | Resp 18 | Ht 71.0 in | Wt 188.1 lb

## 2024-08-20 VITALS — BP 123/72 | HR 73

## 2024-08-20 DIAGNOSIS — Z5112 Encounter for antineoplastic immunotherapy: Secondary | ICD-10-CM | POA: Diagnosis not present

## 2024-08-20 DIAGNOSIS — Z5111 Encounter for antineoplastic chemotherapy: Secondary | ICD-10-CM | POA: Diagnosis not present

## 2024-08-20 DIAGNOSIS — C50512 Malignant neoplasm of lower-outer quadrant of left female breast: Secondary | ICD-10-CM

## 2024-08-20 DIAGNOSIS — Z171 Estrogen receptor negative status [ER-]: Secondary | ICD-10-CM | POA: Diagnosis not present

## 2024-08-20 LAB — CBC WITH DIFFERENTIAL (CANCER CENTER ONLY)
Abs Immature Granulocytes: 0.03 K/uL (ref 0.00–0.07)
Basophils Absolute: 0 K/uL (ref 0.0–0.1)
Basophils Relative: 1 %
Eosinophils Absolute: 0.1 K/uL (ref 0.0–0.5)
Eosinophils Relative: 2 %
HCT: 33.3 % — ABNORMAL LOW (ref 36.0–46.0)
Hemoglobin: 11.2 g/dL — ABNORMAL LOW (ref 12.0–15.0)
Immature Granulocytes: 1 %
Lymphocytes Relative: 27 %
Lymphs Abs: 1.5 K/uL (ref 0.7–4.0)
MCH: 28.9 pg (ref 26.0–34.0)
MCHC: 33.6 g/dL (ref 30.0–36.0)
MCV: 86 fL (ref 80.0–100.0)
Monocytes Absolute: 0.3 K/uL (ref 0.1–1.0)
Monocytes Relative: 5 %
Neutro Abs: 3.6 K/uL (ref 1.7–7.7)
Neutrophils Relative %: 64 %
Platelet Count: 266 K/uL (ref 150–400)
RBC: 3.87 MIL/uL (ref 3.87–5.11)
RDW: 14.6 % (ref 11.5–15.5)
WBC Count: 5.6 K/uL (ref 4.0–10.5)
nRBC: 0 % (ref 0.0–0.2)

## 2024-08-20 LAB — CMP (CANCER CENTER ONLY)
ALT: 23 U/L (ref 0–44)
AST: 24 U/L (ref 15–41)
Albumin: 3.6 g/dL (ref 3.5–5.0)
Alkaline Phosphatase: 58 U/L (ref 38–126)
Anion gap: 10 (ref 5–15)
BUN: 15 mg/dL (ref 8–23)
CO2: 25 mmol/L (ref 22–32)
Calcium: 8.4 mg/dL — ABNORMAL LOW (ref 8.9–10.3)
Chloride: 104 mmol/L (ref 98–111)
Creatinine: 0.74 mg/dL (ref 0.44–1.00)
GFR, Estimated: >60 mL/min (ref >60)
Glucose, Bld: 133 mg/dL — ABNORMAL HIGH (ref 70–99)
Potassium: 4 mmol/L (ref 3.5–5.1)
Sodium: 139 mmol/L (ref 135–145)
Total Bilirubin: 0.5 mg/dL (ref 0.0–1.2)
Total Protein: 6.2 g/dL — ABNORMAL LOW (ref 6.5–8.1)

## 2024-08-20 MED ORDER — DEXAMETHASONE SOD PHOSPHATE PF 10 MG/ML IJ SOLN
10.0000 mg | Freq: Once | INTRAMUSCULAR | Status: AC
  Administered 2024-08-20: 10 mg via INTRAVENOUS

## 2024-08-20 MED ORDER — TRAZODONE HCL 50 MG PO TABS: 50.0000 mg | ORAL_TABLET | Freq: Every day | ORAL | 2 refills | Status: DC

## 2024-08-20 MED ORDER — PALONOSETRON HCL INJECTION 0.25 MG/5ML
0.2500 mg | Freq: Once | INTRAVENOUS | Status: AC
  Administered 2024-08-20: 0.25 mg via INTRAVENOUS
  Filled 2024-08-20: qty 5

## 2024-08-20 MED ORDER — SODIUM CHLORIDE 0.9 % IV SOLN
INTRAVENOUS | Status: DC
  Filled 2024-08-20 (×2): qty 250

## 2024-08-20 MED ORDER — FAMOTIDINE IN NACL 20-0.9 MG/50ML-% IV SOLN
20.0000 mg | Freq: Once | INTRAVENOUS | Status: AC
  Administered 2024-08-20: 20 mg via INTRAVENOUS
  Filled 2024-08-20: qty 50

## 2024-08-20 MED ORDER — SODIUM CHLORIDE 0.9 % IV SOLN
179.5500 mg | Freq: Once | INTRAVENOUS | Status: AC
  Administered 2024-08-20: 180 mg via INTRAVENOUS
  Filled 2024-08-20: qty 18

## 2024-08-20 MED ORDER — SODIUM CHLORIDE 0.9 % IV SOLN
80.0000 mg/m2 | Freq: Once | INTRAVENOUS | Status: AC
  Administered 2024-08-20: 162 mg via INTRAVENOUS
  Filled 2024-08-20: qty 27

## 2024-08-20 MED ORDER — DIPHENHYDRAMINE HCL 50 MG/ML IJ SOLN
50.0000 mg | Freq: Once | INTRAMUSCULAR | Status: AC
  Administered 2024-08-20: 50 mg via INTRAVENOUS
  Filled 2024-08-20: qty 1

## 2024-08-20 NOTE — Patient Instructions (Signed)
 [facility] - A DEPT OF [facility]  Discharge Instructions: Thank you for choosing [facility] to provide your oncology and hematology care.  If you have a lab appointment with the Cancer Center, please go directly to the Cancer Center and check in at the registration area.  Wear comfortable clothing and clothing appropriate for easy access to any Portacath or PICC line.   We strive to give you quality time with your provider. You may need to reschedule your appointment if you arrive late (15 or more minutes).  Arriving late affects you and other patients whose appointments are after yours.  Also, if you miss three or more appointments without notifying the office, you may be dismissed from the clinic at the provider's discretion.      For prescription refill requests, have your pharmacy contact our office and allow 72 hours for refills to be completed.    Today you received the following chemotherapy and/or immunotherapy agents- taxol, carboplatin      To help prevent nausea and vomiting after your treatment, we encourage you to take your nausea medication as directed.  BELOW ARE SYMPTOMS THAT SHOULD BE REPORTED IMMEDIATELY: *FEVER GREATER THAN 100.4 F (38 C) OR HIGHER *CHILLS OR SWEATING *NAUSEA AND VOMITING THAT IS NOT CONTROLLED WITH YOUR NAUSEA MEDICATION *UNUSUAL SHORTNESS OF BREATH *UNUSUAL BRUISING OR BLEEDING *URINARY PROBLEMS (pain or burning when urinating, or frequent urination) *BOWEL PROBLEMS (unusual diarrhea, constipation, pain near the anus) TENDERNESS IN MOUTH AND THROAT WITH OR WITHOUT PRESENCE OF ULCERS (sore throat, sores in mouth, or a toothache) UNUSUAL RASH, SWELLING OR PAIN  UNUSUAL VAGINAL DISCHARGE OR ITCHING   Items with * indicate a potential emergency and should be followed up as soon as possible or go to the Emergency Department if any problems should occur.  Please show the CHEMOTHERAPY ALERT CARD or  IMMUNOTHERAPY ALERT CARD at check-in to the Emergency Department and triage nurse.  Should you have questions after your visit or need to cancel or reschedule your appointment, please contact [facility] - A DEPT OF [facility]  [phone] and follow the prompts.  Office hours are 8:00 a.m. to 4:30 p.m. Monday - Friday. Please note that voicemails left after 4:00 p.m. may not be returned until the following business day.  We are closed weekends and major holidays. You have access to a nurse at all times for urgent questions. Please call the main number to the clinic [phone] and follow the prompts.  For any non-urgent questions, you may also contact your provider using MyChart. We now offer e-Visits for anyone 36 and older to request care online for non-urgent symptoms. For details visit [email or web address].   Also download the MyChart app! Go to the app store, search "MyChart", open the app, select [place], and log in with your MyChart username and password.

## 2024-08-20 NOTE — Progress Notes (Signed)
 Patient requesting medication to help sleep; she is also having some constipation issues.

## 2024-08-20 NOTE — Patient Instructions (Signed)

## 2024-08-23 ENCOUNTER — Inpatient Hospital Stay

## 2024-08-23 ENCOUNTER — Encounter: Payer: Self-pay | Admitting: Oncology

## 2024-08-24 ENCOUNTER — Inpatient Hospital Stay

## 2024-08-25 ENCOUNTER — Inpatient Hospital Stay

## 2024-08-30 ENCOUNTER — Encounter: Payer: Self-pay | Admitting: Oncology

## 2024-08-30 NOTE — Progress Notes (Signed)
 Hematology/Oncology Consult note [facility]  Telephone:([phone] [phone]  Patient Care Team: Antonette Angeline ORN, NP as PCP - General (Internal Medicine) Georgina Shasta POUR, RN as Oncology Nurse Navigator Melanee Annah BROCKS, MD as Consulting Physician (Oncology)   Name of the patient: Amber Figueroa  [id number]  [birth year]   Date of visit: 08/30/24  Diagnosis- . Cancer Staging  Malignant neoplasm of lower-outer quadrant of left breast of female, estrogen receptor negative (HCC) Staging form: Breast, AJCC 8th Edition - Clinical stage from 07/27/2024: Stage IIB (cT2, cN0, cM0, G2, ER-, PR-, HER2-) - Signed by Melanee Annah BROCKS, MD on 07/27/2024 Stage prefix: Initial diagnosis Histologic grading system: 3 grade system    Chief complaint/ Reason for visit-on treatment assessment prior to cycle 3 of weekly CarboTaxol chemotherapy  Heme/Onc history:  patient is a 63 year old female with a past medical history significant for hypertension who underwent a bilateral diagnostic mammogram on 07/19/2024 for a palpable left breast lump..  Mammogram showed a 3 cm highly suspicious mass in the left breast.  No left axillary adenopathy.  Small benign right breast cysts.  No evidence of right breast malignancy.  Patient underwent core biopsy of the left breast mass which was consistent with invasive mammary carcinoma grade 2 1.4 cm ER 0% negative, PR 0% negative, Ki-67 25% and HER2 negative +1   Patient lives with her son and grandson.  She works at [employer] and is independent of her ADLs and IADLs.  Menarche at 71.  G2 P2.  Age at first birth 52.  She used birth control pills in the past remotely.  Maternal grandmother with possible history of breast cancer.  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 MRI bilateral breast 07/27/2024 showed 3 cm  biopsy-proven breast carcinoma in the inferior aspect of the left breast.  Indeterminate non-mass enhancement extending anteriorly which may represent additional malignancy.  No evidence of right breast malignancy.  No evidence of axillary adenopathy.    Interval history- Discussed the use of AI scribe software for clinical note transcription with the patient, who gave verbal consent to proceed.  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  She recently underwent a biopsy due to an MRI showing an additional area of concern. The biopsy results were negative.  No tingling or numbness in her hands and feet.  Overall she is tolerating chemotherapy well without any significant side effects  ECOG PS- 0 Pain scale- 0   Review of systems- Review of Systems  Constitutional:  Negative for chills, fever, malaise/fatigue and weight loss.  HENT:  Negative for congestion, ear discharge and nosebleeds.   Eyes:  Negative for blurred vision.  Respiratory:  Negative for cough, hemoptysis, sputum production, shortness of breath and wheezing.   Cardiovascular:  Negative for chest pain, palpitations, orthopnea and claudication.  Gastrointestinal:  Negative for abdominal pain, blood in stool, constipation, diarrhea, heartburn, melena, nausea and vomiting.  Genitourinary:  Negative for dysuria, flank pain, frequency, hematuria and urgency.  Musculoskeletal:  Negative for back pain, joint pain and myalgias.  Skin:  Negative for rash.  Neurological:  Negative for dizziness, tingling, focal weakness, seizures, weakness and headaches.  Endo/Heme/Allergies:  Does not bruise/bleed easily.  Psychiatric/Behavioral:  Negative  for depression and suicidal ideas. The patient does not have insomnia.       No Known Allergies   Past Medical History:  Diagnosis Date   Back pain    Complication of anesthesia     slow to wake up with the knee surgery   GERD (gastroesophageal reflux disease)    Hypertension    Malignant neoplasm of lower-outer quadrant of left breast of female, estrogen receptor negative (HCC) 06/2024     Past Surgical History:  Procedure Laterality Date   BREAST BIOPSY Left 07/21/2024   US  LT BREAST BX W LOC DEV 1ST LESION IMG BX SPEC US  GUIDE 07/21/2024 [facility]   COLONOSCOPY WITH PROPOFOL  N/A 07/21/2017   Procedure: COLONOSCOPY WITH PROPOFOL ;  Surgeon: Therisa Bi, MD;  Location: [facility];  Service: Gastroenterology;  Laterality: N/A;   KNEE SURGERY Left    arthroscopic   PORTACATH PLACEMENT N/A 08/03/2024   Procedure: INSERTION, TUNNELED CENTRAL VENOUS DEVICE, WITH PORT;  Surgeon: Jordis Laneta FALCON, MD;  Location: [facility] ORS;  Service: General;  Laterality: N/A;    Social History   Socioeconomic History   Marital status: Legally Separated    Spouse name: Not on file   Number of children: 2   Years of education: Not on file   Highest education level: Not on file  Occupational History   Occupation: warehouse  Tobacco Use   Smoking status: Every Day    Current packs/day: 1.00    Average packs/day: 1 pack/day for 1 year (1.0 ttl pk-yrs)    Types: Cigarettes    Passive exposure: Past   Smokeless tobacco: Never   Tobacco comments:    age [age] smoked 1/2 ppd, quit x 20 years, now smoking 1 year  Vaping Use   Vaping status: Never Used  Substance and Sexual Activity   Alcohol use: No   Drug use: No   Sexual activity: Never  Other Topics Concern   Not on file  Social History Narrative   Lives with son and grandbaby   Social Drivers of Health   Financial Resource Strain: Not on file  Food Insecurity: No Food Insecurity (07/27/2024)   Hunger Vital Sign    Worried About Running Out of Food in the Last Year: Never true    Ran Out of Food in the Last Year: Never true  Transportation Needs: No Transportation Needs (07/27/2024)   PRAPARE - Therapist, Art (Medical): No    Lack of Transportation (Non-Medical): No  Physical Activity: Not on file  Stress: Not on file  Social Connections: Not on file  Intimate Partner Violence: Not At Risk (07/27/2024)   Humiliation, Afraid, Rape, and Kick questionnaire    Fear of Current or Ex-Partner: No    Emotionally Abused: No    Physically Abused: No    Sexually Abused: No    Family History  Problem Relation Age of Onset   Cancer Mother    Bone cancer Father        unknown type   Hypertension Sister    Hypertension Brother    Hyperlipidemia Brother    Hypertension Brother    Diabetes Brother    Hypertension Brother    Breast cancer Maternal Grandmother        likely breast ca - mastectomy   Heart attack Neg Hx    Stroke Neg Hx    Colon cancer Neg Hx    Ovarian cancer Neg Hx      Current  Outpatient Medications:    albuterol  (VENTOLIN  HFA) 108 (90 Base) MCG/ACT inhaler, Inhale into the lungs every 6 (six) hours as needed for wheezing or shortness of breath., Disp: , Rfl:    amLODipine  (NORVASC ) 10 MG tablet, TAKE 1 TABLET BY MOUTH EVERY DAY, Disp: 90 tablet, Rfl: 0   aspirin-sod bicarb-citric acid (ALKA-SELTZER) 325 MG TBEF tablet, Take 325 mg by mouth every 6 (six) hours as needed., Disp: , Rfl:    dexamethasone  (DECADRON ) 4 MG tablet, Take 2 tablets daily for 2 days, start the day after chemotherapy. Take with food. (Patient taking differently: as needed. Take 2 tablets daily for 2 days, start the day after chemotherapy. Take with food.), Disp: 30 tablet, Rfl: 1   HYDROcodone -acetaminophen  (NORCO/VICODIN) 5-325 MG tablet, Take 1 tablet by mouth every 6 (six) hours as needed for moderate pain (pain score 4-6)., Disp: 12 tablet, Rfl: 0   lidocaine -prilocaine  (EMLA ) cream, Apply to affected area once, Disp: 30 g, Rfl: 3   ondansetron  (ZOFRAN ) 8 MG tablet, Take 1 tablet (8 mg total) by mouth every 8 (eight) hours as needed for nausea or vomiting. Start on the third day  after chemotherapy., Disp: 30 tablet, Rfl: 1   prochlorperazine  (COMPAZINE ) 10 MG tablet, Take 1 tablet (10 mg total) by mouth every 6 (six) hours as needed for nausea or vomiting., Disp: 30 tablet, Rfl: 1   traZODone  (DESYREL ) 50 MG tablet, Take 1 tablet (50 mg total) by mouth at bedtime., Disp: 30 tablet, Rfl: 2  Physical exam:  Vitals:   08/20/24 0847  BP: 125/70  Pulse: 85  Resp: 18  Temp: (!) 95.8 F (35.4 C)  TempSrc: Tympanic  SpO2: 100%  Weight: 188 lb 1.6 oz (85.3 kg)  Height: 5' 11 (1.803 m)   Physical Exam Cardiovascular:     Rate and Rhythm: Normal rate and regular rhythm.     Heart sounds: Normal heart sounds.  Pulmonary:     Effort: Pulmonary effort is normal.     Breath sounds: Normal breath sounds.  Skin:    General: Skin is warm and dry.  Neurological:     Mental Status: She is alert and oriented to person, place, and time.      I have personally reviewed labs listed below:    Latest Ref Rng & Units 08/20/2024    8:33 AM  CMP  Glucose 70 - 99 mg/dL 866   BUN 8 - 23 mg/dL 15   Creatinine 9.55 - 1.00 mg/dL 9.25   Sodium 864 - 854 mmol/L 139   Potassium 3.5 - 5.1 mmol/L 4.0   Chloride 98 - 111 mmol/L 104   CO2 22 - 32 mmol/L 25   Calcium 8.9 - 10.3 mg/dL 8.4   Total Protein 6.5 - 8.1 g/dL 6.2   Total Bilirubin 0.0 - 1.2 mg/dL 0.5   Alkaline Phos 38 - 126 U/L 58   AST 15 - 41 U/L 24   ALT 0 - 44 U/L 23       Latest Ref Rng & Units 08/20/2024    8:33 AM  CBC  WBC 4.0 - 10.5 K/uL 5.6   Hemoglobin 12.0 - 15.0 g/dL 88.7   Hematocrit 63.9 - 46.0 % 33.3   Platelets 150 - 400 K/uL 266    I have personally reviewed Radiology images listed below: No images are attached to the encounter.  MR LT BREAST BX W LOC DEV 1ST LESION IMAGE BX SPEC MR GUIDE Addendum Date: 08/17/2024 ADDENDUM REPORT:  08/17/2024 14:20 ADDENDUM: PATHOLOGY revealed: 1. Breast, left, needle core biopsy, (barbell clip) : FIBROCYSTIC CHANGES WITH USUAL DUCTAL HYPERPLASIA. FOCAL  STROMAL CHANGES CONSISTENT WITH RUPTURED CYST. - NEGATIVE FOR MALIGNANCY. Pathology results are CONCORDANT with imaging findings, per Curtistine Noble MD. Pathology results and recommendations were discussed with patient via telephone on 08/17/2024 by Rock Hover RN. Patient reported biopsy site doing well with no adverse symptoms, and slight tenderness at the site. Post biopsy care instructions were reviewed, questions were answered and my direct phone number was provided. Patient was instructed to call [facility] for any additional questions or concerns related to biopsy site. RECOMMENDATION: Patient to follow current treatment plan/surgical management for recently diagnosed breast cancer. Pathology results reported by Rock Hover RN on 08/17/2024. Electronically Signed   By: Curtistine Noble   On: 08/17/2024 14:20   Result Date: 08/17/2024 CLINICAL DATA:  63 year old female recently diagnosed with invasive mammary cancer. Here for MRI guided biopsy of left breast non mass enhancement to check for extent of disease. EXAM: MRI GUIDED CORE NEEDLE BIOPSY OF THE LEFT BREAST TECHNIQUE: Multiplanar, multisequence MR imaging of the left breast was performed both before and after administration of intravenous contrast. CONTRAST:  8 ml Vueway  COMPARISON:  Previous exam(s). FINDINGS: I met with the patient, and we discussed the procedure of MRI guided biopsy, including risks, benefits, and alternatives. Specifically, we discussed the risks of infection, bleeding, tissue injury, clip migration, and inadequate sampling. Informed, written consent was given. The usual time out protocol was performed immediately prior to the procedure. Using sterile technique, 1% Lidocaine , MRI guidance, and a 9 gauge vacuum assisted device, biopsy was performed of left breast non mass enhancement using a lateral to medial approach. At the conclusion of the procedure, a barbell shaped tissue marker clip was deployed into  the biopsy cavity. Follow-up 2-view mammogram was performed and dictated separately. IMPRESSION: MRI guided biopsy of the left breast as above. No apparent complications. Electronically Signed: By: Curtistine Noble On: 08/16/2024 10:57   MM CLIP PLACEMENT LEFT Result Date: 08/16/2024 CLINICAL DATA:  63 year old female status post left breast MRI guided biopsy. EXAM: 3D DIAGNOSTIC LEFT MAMMOGRAM POST MRI BIOPSY COMPARISON:  Previous exam(s). ACR Breast Density Category c: The breasts are heterogeneously dense, which may obscure small masses. FINDINGS: 3D Mammographic images were obtained following MRI guided biopsy of left breast non mass enhancement about the lower central breast. The biopsy marking clip is in expected position at the site of biopsy. IMPRESSION: Appropriate positioning of the barbell shaped biopsy marking clip at the site of biopsy in the left breast as above. Final Assessment: Post Procedure Mammograms for Marker Placement Electronically Signed   By: Curtistine Noble   On: 08/16/2024 12:40   DG CHEST PORT 1 VIEW Result Date: 08/03/2024 CLINICAL DATA:  Port-A-Cath EXAM: PORTABLE CHEST 1 VIEW COMPARISON:  04/08/2019 FINDINGS: Interim placement of right-sided central venous port with tip projecting over the SVC. No acute airspace disease or pleural effusion. Cardiomediastinal silhouette is within normal limits. No definitive pneumothorax. IMPRESSION: Interim placement of right-sided central venous port with tip projecting over the SVC. No definitive pneumothorax. Electronically Signed   By: Luke Bun M.D.   On: 08/03/2024 15:24   DG C-Arm 1-60 Min-No Report Result Date: 08/03/2024 Fluoroscopy was utilized by the requesting physician.  No radiographic interpretation.     Assessment and plan- Patient is a 63 y.o. female with history of stage II triple negative left breast cancer T2 N0  M0 here for on treatment assessment prior to cycle 3 of weekly CarboTaxol chemotherapy neoadjuvant as  a part of keynote 522 regimen  Assessment and Plan    Triple negative breast cancer, stage II, on chemotherapy Counts okay to proceed with cycle 3 of weekly CarboTaxol chemoTherapy today. Since her white cell count is normal with an ANC of 2.8 I am not giving her any Neupogen with this cycle.  I will see her back in 2 weeks for cycle 4 of weekly CarboTaxol chemotherapy and she will receive dose 2 of Keytruda  on that day.  Discussed the results of MRI guided biopsy which was done given that there was non-mass enhancement seen extending anteriorly from the biopsy-proven mass.  This enhancement was biopsied and was consistent with fibrocystic changes and usual ductal hyperplasia negative for malignancy.  Therefore patient will likely be able to get a lumpectomy instead of a mastectomy following neoadjuvant chemotherapy        Visit Diagnosis 1. Malignant neoplasm of lower-outer quadrant of left breast of female, estrogen receptor negative (HCC)   2. Encounter for antineoplastic chemotherapy      Dr. Annah Skene, MD, MPH [facility] [phone] 08/30/2024 8:35 AM

## 2024-09-01 ENCOUNTER — Inpatient Hospital Stay: Attending: Oncology | Admitting: Hospice and Palliative Medicine

## 2024-09-01 DIAGNOSIS — Z79899 Other long term (current) drug therapy: Secondary | ICD-10-CM | POA: Insufficient documentation

## 2024-09-01 DIAGNOSIS — Z5112 Encounter for antineoplastic immunotherapy: Secondary | ICD-10-CM | POA: Insufficient documentation

## 2024-09-01 DIAGNOSIS — Z17421 Hormone receptor negative with human epidermal growth factor receptor 2 negative status: Secondary | ICD-10-CM | POA: Insufficient documentation

## 2024-09-01 DIAGNOSIS — Z1722 Progesterone receptor negative status: Secondary | ICD-10-CM | POA: Insufficient documentation

## 2024-09-01 DIAGNOSIS — C50512 Malignant neoplasm of lower-outer quadrant of left female breast: Secondary | ICD-10-CM | POA: Insufficient documentation

## 2024-09-01 DIAGNOSIS — Z5111 Encounter for antineoplastic chemotherapy: Secondary | ICD-10-CM | POA: Insufficient documentation

## 2024-09-01 DIAGNOSIS — Z171 Estrogen receptor negative status [ER-]: Secondary | ICD-10-CM | POA: Insufficient documentation

## 2024-09-01 DIAGNOSIS — Z1732 Human epidermal growth factor receptor 2 negative status: Secondary | ICD-10-CM | POA: Insufficient documentation

## 2024-09-01 DIAGNOSIS — F17210 Nicotine dependence, cigarettes, uncomplicated: Secondary | ICD-10-CM | POA: Insufficient documentation

## 2024-09-01 NOTE — Progress Notes (Signed)
 Multidisciplinary Oncology Council Documentation  Amber Figueroa was presented by our [facility] on 09/01/2024, which included representatives from:  Palliative Care Dietitian  Physical/Occupational Therapist Nurse Navigator Genetics Social work Survivorship RN Financial Navigator Research RN   Amber Figueroa currently presents with history of breast cancer  We reviewed previous medical and familial history, history of present illness, and recent lab results along with all available histopathologic and imaging studies. The MOC considered available treatment options and made the following recommendations/referrals:  Rehab, SW, genetics, nutrition  The MOC is a meeting of clinicians from various specialty areas who evaluate and discuss patients for whom a multidisciplinary approach is being considered. Final determinations in the plan of care are those of the provider(s).   Today's extended care, comprehensive team conference, Amber Figueroa was not present for the discussion and was not examined.

## 2024-09-02 ENCOUNTER — Encounter: Payer: Self-pay | Admitting: Oncology

## 2024-09-03 ENCOUNTER — Inpatient Hospital Stay: Admitting: Oncology

## 2024-09-03 ENCOUNTER — Inpatient Hospital Stay

## 2024-09-03 VITALS — BP 141/86 | HR 76 | Temp 98.0°F | Resp 18

## 2024-09-03 DIAGNOSIS — C50512 Malignant neoplasm of lower-outer quadrant of left female breast: Secondary | ICD-10-CM | POA: Diagnosis present

## 2024-09-03 DIAGNOSIS — Z1722 Progesterone receptor negative status: Secondary | ICD-10-CM | POA: Diagnosis not present

## 2024-09-03 DIAGNOSIS — Z5112 Encounter for antineoplastic immunotherapy: Secondary | ICD-10-CM | POA: Diagnosis present

## 2024-09-03 DIAGNOSIS — Z79899 Other long term (current) drug therapy: Secondary | ICD-10-CM | POA: Diagnosis not present

## 2024-09-03 DIAGNOSIS — F17210 Nicotine dependence, cigarettes, uncomplicated: Secondary | ICD-10-CM | POA: Diagnosis not present

## 2024-09-03 DIAGNOSIS — Z1732 Human epidermal growth factor receptor 2 negative status: Secondary | ICD-10-CM | POA: Diagnosis not present

## 2024-09-03 DIAGNOSIS — Z5111 Encounter for antineoplastic chemotherapy: Secondary | ICD-10-CM | POA: Diagnosis present

## 2024-09-03 DIAGNOSIS — Z17421 Hormone receptor negative with human epidermal growth factor receptor 2 negative status: Secondary | ICD-10-CM | POA: Diagnosis not present

## 2024-09-03 DIAGNOSIS — Z171 Estrogen receptor negative status [ER-]: Secondary | ICD-10-CM | POA: Diagnosis not present

## 2024-09-03 LAB — CMP (CANCER CENTER ONLY)
ALT: 27 U/L (ref 0–44)
AST: 26 U/L (ref 15–41)
Albumin: 3.9 g/dL (ref 3.5–5.0)
Alkaline Phosphatase: 72 U/L (ref 38–126)
Anion gap: 10 (ref 5–15)
BUN: 11 mg/dL (ref 8–23)
CO2: 25 mmol/L (ref 22–32)
Calcium: 9.2 mg/dL (ref 8.9–10.3)
Chloride: 108 mmol/L (ref 98–111)
Creatinine: 0.73 mg/dL (ref 0.44–1.00)
GFR, Estimated: >60 mL/min (ref >60)
Glucose, Bld: 127 mg/dL — ABNORMAL HIGH (ref 70–99)
Potassium: 3.6 mmol/L (ref 3.5–5.1)
Sodium: 143 mmol/L (ref 135–145)
Total Bilirubin: 0.4 mg/dL (ref 0.0–1.2)
Total Protein: 6.2 g/dL — ABNORMAL LOW (ref 6.5–8.1)

## 2024-09-03 LAB — CBC WITH DIFFERENTIAL (CANCER CENTER ONLY)
Abs Immature Granulocytes: 0.01 K/uL (ref 0.00–0.07)
Basophils Absolute: 0 K/uL (ref 0.0–0.1)
Basophils Relative: 1 %
Eosinophils Absolute: 0.1 K/uL (ref 0.0–0.5)
Eosinophils Relative: 2 %
HCT: 33.9 % — ABNORMAL LOW (ref 36.0–46.0)
Hemoglobin: 11.2 g/dL — ABNORMAL LOW (ref 12.0–15.0)
Immature Granulocytes: 0 %
Lymphocytes Relative: 31 %
Lymphs Abs: 1.3 K/uL (ref 0.7–4.0)
MCH: 29.2 pg (ref 26.0–34.0)
MCHC: 33 g/dL (ref 30.0–36.0)
MCV: 88.3 fL (ref 80.0–100.0)
Monocytes Absolute: 0.5 K/uL (ref 0.1–1.0)
Monocytes Relative: 11 %
Neutro Abs: 2.3 K/uL (ref 1.7–7.7)
Neutrophils Relative %: 55 %
Platelet Count: 222 K/uL (ref 150–400)
RBC: 3.84 MIL/uL — ABNORMAL LOW (ref 3.87–5.11)
RDW: 16.4 % — ABNORMAL HIGH (ref 11.5–15.5)
WBC Count: 4.1 K/uL (ref 4.0–10.5)
nRBC: 0 % (ref 0.0–0.2)

## 2024-09-03 MED ORDER — SODIUM CHLORIDE 0.9 % IV SOLN
177.6000 mg | Freq: Once | INTRAVENOUS | Status: AC
  Administered 2024-09-03: 180 mg via INTRAVENOUS
  Filled 2024-09-03: qty 18

## 2024-09-03 MED ORDER — PALONOSETRON HCL INJECTION 0.25 MG/5ML
0.2500 mg | Freq: Once | INTRAVENOUS | Status: AC
  Administered 2024-09-03: 0.25 mg via INTRAVENOUS
  Filled 2024-09-03: qty 5

## 2024-09-03 MED ORDER — DIPHENHYDRAMINE HCL 50 MG/ML IJ SOLN
50.0000 mg | Freq: Once | INTRAMUSCULAR | Status: AC
  Administered 2024-09-03: 50 mg via INTRAVENOUS
  Filled 2024-09-03: qty 1

## 2024-09-03 MED ORDER — SODIUM CHLORIDE 0.9 % IV SOLN
80.0000 mg/m2 | Freq: Once | INTRAVENOUS | Status: AC
  Administered 2024-09-03: 162 mg via INTRAVENOUS
  Filled 2024-09-03: qty 27

## 2024-09-03 MED ORDER — FAMOTIDINE IN NACL 20-0.9 MG/50ML-% IV SOLN
20.0000 mg | Freq: Once | INTRAVENOUS | Status: AC
  Administered 2024-09-03: 20 mg via INTRAVENOUS
  Filled 2024-09-03: qty 50

## 2024-09-03 MED ORDER — SODIUM CHLORIDE 0.9 % IV SOLN
INTRAVENOUS | Status: DC
  Filled 2024-09-03: qty 250

## 2024-09-03 MED ORDER — DEXAMETHASONE SOD PHOSPHATE PF 10 MG/ML IJ SOLN
10.0000 mg | Freq: Once | INTRAMUSCULAR | Status: AC
  Administered 2024-09-03: 10 mg via INTRAVENOUS

## 2024-09-03 MED ORDER — SODIUM CHLORIDE 0.9 % IV SOLN
200.0000 mg | Freq: Once | INTRAVENOUS | Status: AC
  Administered 2024-09-03: 200 mg via INTRAVENOUS
  Filled 2024-09-03: qty 8

## 2024-09-03 NOTE — Patient Instructions (Signed)
 [facility] - A DEPT OF [facility]  Discharge Instructions: Thank you for choosing [facility] to provide your oncology and hematology care.  If you have a lab appointment with the Cancer Center, please go directly to the Cancer Center and check in at the registration area.  Wear comfortable clothing and clothing appropriate for easy access to any Portacath or PICC line.   We strive to give you quality time with your provider. You may need to reschedule your appointment if you arrive late (15 or more minutes).  Arriving late affects you and other patients whose appointments are after yours.  Also, if you miss three or more appointments without notifying the office, you may be dismissed from the clinic at the provider's discretion.      For prescription refill requests, have your pharmacy contact our office and allow 72 hours for refills to be completed.    Today you received the following chemotherapy and/or immunotherapy agents Pembrolizumab , Paclitaxel , Carboplatin        To help prevent nausea and vomiting after your treatment, we encourage you to take your nausea medication as directed.  BELOW ARE SYMPTOMS THAT SHOULD BE REPORTED IMMEDIATELY: *FEVER GREATER THAN 100.4 F (38 C) OR HIGHER *CHILLS OR SWEATING *NAUSEA AND VOMITING THAT IS NOT CONTROLLED WITH YOUR NAUSEA MEDICATION *UNUSUAL SHORTNESS OF BREATH *UNUSUAL BRUISING OR BLEEDING *URINARY PROBLEMS (pain or burning when urinating, or frequent urination) *BOWEL PROBLEMS (unusual diarrhea, constipation, pain near the anus) TENDERNESS IN MOUTH AND THROAT WITH OR WITHOUT PRESENCE OF ULCERS (sore throat, sores in mouth, or a toothache) UNUSUAL RASH, SWELLING OR PAIN  UNUSUAL VAGINAL DISCHARGE OR ITCHING   Items with * indicate a potential emergency and should be followed up as soon as possible or go to the Emergency Department if any problems should occur.  Please show the CHEMOTHERAPY  ALERT CARD or IMMUNOTHERAPY ALERT CARD at check-in to the Emergency Department and triage nurse.  Should you have questions after your visit or need to cancel or reschedule your appointment, please contact [facility] - A DEPT OF [facility]  [phone] and follow the prompts.  Office hours are 8:00 a.m. to 4:30 p.m. Monday - Friday. Please note that voicemails left after 4:00 p.m. may not be returned until the following business day.  We are closed weekends and major holidays. You have access to a nurse at all times for urgent questions. Please call the main number to the clinic [phone] and follow the prompts.  For any non-urgent questions, you may also contact your provider using MyChart. We now offer e-Visits for anyone 34 and older to request care online for non-urgent symptoms. For details visit [email or web address].   Also download the MyChart app! Go to the app store, search MyChart, open the app, select [facility], and log in with your MyChart username and password.

## 2024-09-06 ENCOUNTER — Inpatient Hospital Stay

## 2024-09-06 ENCOUNTER — Inpatient Hospital Stay: Admitting: Oncology

## 2024-09-10 ENCOUNTER — Encounter: Payer: Self-pay | Admitting: Oncology

## 2024-09-10 ENCOUNTER — Inpatient Hospital Stay: Admitting: Oncology

## 2024-09-10 ENCOUNTER — Inpatient Hospital Stay

## 2024-09-10 VITALS — BP 115/57 | HR 85 | Temp 97.6°F | Resp 19 | Wt 188.0 lb

## 2024-09-10 DIAGNOSIS — Z171 Estrogen receptor negative status [ER-]: Secondary | ICD-10-CM | POA: Diagnosis not present

## 2024-09-10 DIAGNOSIS — G620 Drug-induced polyneuropathy: Secondary | ICD-10-CM

## 2024-09-10 DIAGNOSIS — C50512 Malignant neoplasm of lower-outer quadrant of left female breast: Secondary | ICD-10-CM

## 2024-09-10 DIAGNOSIS — Z5111 Encounter for antineoplastic chemotherapy: Secondary | ICD-10-CM

## 2024-09-10 DIAGNOSIS — T451X5A Adverse effect of antineoplastic and immunosuppressive drugs, initial encounter: Secondary | ICD-10-CM

## 2024-09-10 DIAGNOSIS — Z5112 Encounter for antineoplastic immunotherapy: Secondary | ICD-10-CM | POA: Diagnosis not present

## 2024-09-10 LAB — CMP (CANCER CENTER ONLY)
ALT: 22 U/L (ref 0–44)
AST: 19 U/L (ref 15–41)
Albumin: 4 g/dL (ref 3.5–5.0)
Alkaline Phosphatase: 76 U/L (ref 38–126)
Anion gap: 10 (ref 5–15)
BUN: 11 mg/dL (ref 8–23)
CO2: 26 mmol/L (ref 22–32)
Calcium: 9.4 mg/dL (ref 8.9–10.3)
Chloride: 107 mmol/L (ref 98–111)
Creatinine: 0.77 mg/dL (ref 0.44–1.00)
GFR, Estimated: >60 mL/min (ref >60)
Glucose, Bld: 138 mg/dL — ABNORMAL HIGH (ref 70–99)
Potassium: 3.8 mmol/L (ref 3.5–5.1)
Sodium: 143 mmol/L (ref 135–145)
Total Bilirubin: 0.5 mg/dL (ref 0.0–1.2)
Total Protein: 6.4 g/dL — ABNORMAL LOW (ref 6.5–8.1)

## 2024-09-10 LAB — CBC WITH DIFFERENTIAL (CANCER CENTER ONLY)
Abs Immature Granulocytes: 0.03 K/uL (ref 0.00–0.07)
Basophils Absolute: 0 K/uL (ref 0.0–0.1)
Basophils Relative: 1 %
Eosinophils Absolute: 0.2 K/uL (ref 0.0–0.5)
Eosinophils Relative: 3 %
HCT: 34.2 % — ABNORMAL LOW (ref 36.0–46.0)
Hemoglobin: 11.5 g/dL — ABNORMAL LOW (ref 12.0–15.0)
Immature Granulocytes: 1 %
Lymphocytes Relative: 34 %
Lymphs Abs: 1.7 K/uL (ref 0.7–4.0)
MCH: 29.5 pg (ref 26.0–34.0)
MCHC: 33.6 g/dL (ref 30.0–36.0)
MCV: 87.7 fL (ref 80.0–100.0)
Monocytes Absolute: 0.3 K/uL (ref 0.1–1.0)
Monocytes Relative: 5 %
Neutro Abs: 2.8 K/uL (ref 1.7–7.7)
Neutrophils Relative %: 56 %
Platelet Count: 234 K/uL (ref 150–400)
RBC: 3.9 MIL/uL (ref 3.87–5.11)
RDW: 15.9 % — ABNORMAL HIGH (ref 11.5–15.5)
WBC Count: 5 K/uL (ref 4.0–10.5)
nRBC: 0 % (ref 0.0–0.2)

## 2024-09-10 MED ORDER — DEXAMETHASONE SOD PHOSPHATE PF 10 MG/ML IJ SOLN
10.0000 mg | Freq: Once | INTRAMUSCULAR | Status: AC
  Administered 2024-09-10: 10 mg via INTRAVENOUS

## 2024-09-10 MED ORDER — DIPHENHYDRAMINE HCL 50 MG/ML IJ SOLN
50.0000 mg | Freq: Once | INTRAMUSCULAR | Status: AC
  Administered 2024-09-10: 50 mg via INTRAVENOUS
  Filled 2024-09-10: qty 1

## 2024-09-10 MED ORDER — PALONOSETRON HCL INJECTION 0.25 MG/5ML
0.2500 mg | Freq: Once | INTRAVENOUS | Status: AC
  Administered 2024-09-10: 0.25 mg via INTRAVENOUS
  Filled 2024-09-10: qty 5

## 2024-09-10 MED ORDER — SODIUM CHLORIDE 0.9 % IV SOLN
60.0000 mg/m2 | Freq: Once | INTRAVENOUS | Status: AC
  Administered 2024-09-10: 120 mg via INTRAVENOUS
  Filled 2024-09-10: qty 20

## 2024-09-10 MED ORDER — FAMOTIDINE IN NACL 20-0.9 MG/50ML-% IV SOLN
20.0000 mg | Freq: Once | INTRAVENOUS | Status: AC
  Administered 2024-09-10: 20 mg via INTRAVENOUS
  Filled 2024-09-10: qty 50

## 2024-09-10 MED ORDER — SODIUM CHLORIDE 0.9 % IV SOLN
INTRAVENOUS | Status: DC
  Filled 2024-09-10: qty 250

## 2024-09-10 MED ORDER — SODIUM CHLORIDE 0.9 % IV SOLN
177.6000 mg | Freq: Once | INTRAVENOUS | Status: AC
  Administered 2024-09-10: 180 mg via INTRAVENOUS
  Filled 2024-09-10: qty 18

## 2024-09-10 NOTE — Progress Notes (Signed)
 Hematology/Oncology Consult note [facility]  [phone] [phone]  Patient Care Team: Antonette Angeline ORN, NP as PCP - General (Internal Medicine) Georgina Shasta POUR, RN as Oncology Nurse Navigator Melanee Annah BROCKS, MD as Consulting Physician (Oncology)   Name of the patient: Amber Figueroa  [id number]  10-19-1960   Date of visit: 09/10/2024  Diagnosis-  Cancer Staging  Malignant neoplasm of lower-outer quadrant of left breast of female, estrogen receptor negative (HCC) Staging form: Breast, AJCC 8th Edition - Clinical stage from 07/27/2024: Stage IIB (cT2, cN0, cM0, G2, ER-, PR-, HER2-) - Signed by Melanee Annah BROCKS, MD on 07/27/2024 Stage prefix: Initial diagnosis Histologic grading system: 3 grade system    Chief complaint/ Reason for visit-on treatment assessment prior to cycle 5 of weekly CarboTaxol chemotherapy  Heme/Onc history: patient is a 63 year old female with a past medical history significant for hypertension who underwent a bilateral diagnostic mammogram on 07/19/2024 for a palpable left breast lump..  Mammogram showed a 3 cm highly suspicious mass in the left breast.  No left axillary adenopathy.  Small benign right breast cysts.  No evidence of right breast malignancy.  Patient underwent core biopsy of the left breast mass which was consistent with invasive mammary carcinoma grade 2 1.4 cm ER 0% negative, PR 0% negative, Ki-67 25% and HER2 negative +1   Patient lives with her son and grandson.  She works at [employer] and is independent of her ADLs and IADLs.  Menarche at 19.  G2 P2.  Age at first birth 2.  She used birth control pills in the past remotely.  Maternal grandmother with possible history of breast cancer.  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 MRI bilateral breast 07/27/2024 showed 3 cm  biopsy-proven breast carcinoma in the inferior aspect of the left breast.  Indeterminate non-mass enhancement extending anteriorly which may represent additional malignancy.  No evidence of right breast malignancy.  No evidence of axillary adenopathy.   Plan is for neoadjuvant chemotherapy for triple negative breast cancer as per keynote 522 regimen  Interval history- Discussed the use of AI scribe software for clinical note transcription with the patient, who gave verbal consent to proceed.  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  She is currently receiving neoadjuvant paclitaxel  and has completed five of twelve planned cycles. Her sixth cycle is scheduled in one week, with an additional week-long interval after the sixth cycle due to clinic closure.  She reports persistent numbness in her extremities, described as present all the time, consistent with chemotherapy-induced peripheral neuropathy.  She has not observed any change in the size of her left lower-outer quadrant breast mass. Laboratory studies have remained stable, and she has not required hematopoietic growth factors.       ECOG PS- 1 Pain scale- 3 Opioid associated constipation- no  Review of systems- Review of Systems  Constitutional:  Negative for chills, fever, malaise/fatigue and weight loss.  HENT:  Negative for congestion, ear discharge and nosebleeds.   Eyes:  Negative for blurred vision.  Respiratory:  Negative for cough, hemoptysis, sputum production, shortness of breath and wheezing.   Cardiovascular:  Negative for chest pain, palpitations, orthopnea and claudication.  Gastrointestinal:  Negative for abdominal pain, blood in stool, constipation, diarrhea, heartburn, melena, nausea and vomiting.  Genitourinary:  Negative  for dysuria, flank pain, frequency, hematuria and urgency.  Musculoskeletal:  Negative for back pain, joint pain and myalgias.  Skin:  Negative for rash.  Neurological:  Positive for sensory change (Peripheral neuropathy). Negative for dizziness, tingling, focal weakness, seizures, weakness and headaches.  Endo/Heme/Allergies:  Does not bruise/bleed easily.  Psychiatric/Behavioral:  Negative for depression and suicidal ideas. The patient does not have insomnia.       Allergies[1]   Past Medical History:  Diagnosis Date   Back pain    Complication of anesthesia    slow to wake up with the knee surgery   GERD (gastroesophageal reflux disease)    Hypertension    Malignant neoplasm of lower-outer quadrant of left breast of female, estrogen receptor negative (HCC) 06/2024     Past Surgical History:  Procedure Laterality Date   BREAST BIOPSY Left 07/21/2024   US  LT BREAST BX W LOC DEV 1ST LESION IMG BX SPEC US  GUIDE 07/21/2024 [facility]   COLONOSCOPY WITH PROPOFOL  N/A 07/21/2017   Procedure: COLONOSCOPY WITH PROPOFOL ;  Surgeon: Therisa Bi, MD;  Location: [facility];  Service: Gastroenterology;  Laterality: N/A;   KNEE SURGERY Left    arthroscopic   PORTACATH PLACEMENT N/A 08/03/2024   Procedure: INSERTION, TUNNELED CENTRAL VENOUS DEVICE, WITH PORT;  Surgeon: Jordis Laneta FALCON, MD;  Location: [facility] ORS;  Service: General;  Laterality: N/A;    Social History   Socioeconomic History   Marital status: Legally Separated    Spouse name: Not on file   Number of children: 2   Years of education: Not on file   Highest education level: Not on file  Occupational History   Occupation: warehouse  Tobacco Use   Smoking status: Every Day    Current packs/day: 1.00    Average packs/day: 1 pack/day for 1 year (1.0 ttl pk-yrs)    Types: Cigarettes    Passive exposure: Past   Smokeless tobacco: Never   Tobacco comments:    age [age] smoked 1/2 ppd, quit x 20 years, now smoking 1  year  Vaping Use   Vaping status: Never Used  Substance and Sexual Activity   Alcohol use: No   Drug use: No   Sexual activity: Never  Other Topics Concern   Not on file  Social History Narrative   Lives with son and grandbaby   Social Drivers of Health   Tobacco Use: High Risk (09/10/2024)   Patient History    Smoking Tobacco Use: Every Day    Smokeless Tobacco Use: Never    Passive Exposure: Past  Financial Resource Strain: Not on file  Food Insecurity: No Food Insecurity (07/27/2024)   Epic    Worried About Programme Researcher, Broadcasting/film/video in the Last Year: Never true    Ran Out of Food in the Last Year: Never true  Transportation Needs: No Transportation Needs (07/27/2024)   Epic    Lack of Transportation (Medical): No    Lack of Transportation (Non-Medical): No  Physical Activity: Not on file  Stress: Not on file  Social Connections: Not on file  Intimate Partner Violence: Not At Risk (07/27/2024)   Epic    Fear of Current or Ex-Partner: No    Emotionally Abused: No    Physically Abused: No    Sexually Abused: No  Depression (PHQ2-9): Low Risk (09/03/2024)   Depression (PHQ2-9)    PHQ-2 Score:  0  Alcohol Screen: Low Risk (09/09/2022)   Alcohol Screen    Last Alcohol Screening Score (AUDIT): 1  Housing: Low Risk (07/27/2024)   Epic    Unable to Pay for Housing in the Last Year: No    Number of Times Moved in the Last Year: 0    Homeless in the Last Year: No  Utilities: Not At Risk (07/27/2024)   Epic    Threatened with loss of utilities: No  Recent Concern: Utilities - At Risk (07/23/2024)   Epic    Threatened with loss of utilities: Yes  Health Literacy: Not on file    Family History  Problem Relation Age of Onset   Cancer Mother    Bone cancer Father        unknown type   Hypertension Sister    Hypertension Brother    Hyperlipidemia Brother    Hypertension Brother    Diabetes Brother    Hypertension Brother    Breast cancer Maternal Grandmother         likely breast ca - mastectomy   Heart attack Neg Hx    Stroke Neg Hx    Colon cancer Neg Hx    Ovarian cancer Neg Hx     Current Medications[2]  Physical exam:  Vitals:   09/10/24 0914  BP: (!) 115/57  Pulse: 85  Resp: 19  Temp: 97.6 F (36.4 C)  SpO2: 99%  Weight: 188 lb (85.3 kg)   Physical Exam Cardiovascular:     Rate and Rhythm: Normal rate and regular rhythm.     Heart sounds: Normal heart sounds.  Pulmonary:     Effort: Pulmonary effort is normal.     Breath sounds: Normal breath sounds.  Skin:    General: Skin is warm and dry.  Neurological:     Mental Status: She is alert and oriented to person, place, and time.   Breast exam: There is a ill-defined palpable 3 cm mass in the left breast in the inferior aspect.  I have personally reviewed labs listed below:    Latest Ref Rng & Units 09/10/2024    8:57 AM  CMP  Glucose 70 - 99 mg/dL 861   BUN 8 - 23 mg/dL 11   Creatinine 9.55 - 1.00 mg/dL 9.22   Sodium 864 - 854 mmol/L 143   Potassium 3.5 - 5.1 mmol/L 3.8   Chloride 98 - 111 mmol/L 107   CO2 22 - 32 mmol/L 26   Calcium 8.9 - 10.3 mg/dL 9.4   Total Protein 6.5 - 8.1 g/dL 6.4   Total Bilirubin 0.0 - 1.2 mg/dL 0.5   Alkaline Phos 38 - 126 U/L 76   AST 15 - 41 U/L 19   ALT 0 - 44 U/L 22       Latest Ref Rng & Units 09/10/2024    8:57 AM  CBC  WBC 4.0 - 10.5 K/uL 5.0   Hemoglobin 12.0 - 15.0 g/dL 88.4   Hematocrit 63.9 - 46.0 % 34.2   Platelets 150 - 400 K/uL 234    I have personally reviewed Radiology images listed below: No images are attached to the encounter.  MR LT BREAST BX W LOC DEV 1ST LESION IMAGE BX SPEC MR GUIDE Addendum Date: 08/17/2024 ADDENDUM REPORT: 08/17/2024 14:20 ADDENDUM: PATHOLOGY revealed: 1. Breast, left, needle core biopsy, (barbell clip) : FIBROCYSTIC CHANGES WITH USUAL DUCTAL HYPERPLASIA. FOCAL STROMAL CHANGES CONSISTENT WITH RUPTURED CYST. - NEGATIVE FOR MALIGNANCY. Pathology results are CONCORDANT with  imaging findings,  per Curtistine Noble MD. Pathology results and recommendations were discussed with patient via telephone on 08/17/2024 by Rock Hover RN. Patient reported biopsy site doing well with no adverse symptoms, and slight tenderness at the site. Post biopsy care instructions were reviewed, questions were answered and my direct phone number was provided. Patient was instructed to call [facility] for any additional questions or concerns related to biopsy site. RECOMMENDATION: Patient to follow current treatment plan/surgical management for recently diagnosed breast cancer. Pathology results reported by Rock Hover RN on 08/17/2024. Electronically Signed   By: Curtistine Noble   On: 08/17/2024 14:20   Result Date: 08/17/2024 CLINICAL DATA:  63 year old female recently diagnosed with invasive mammary cancer. Here for MRI guided biopsy of left breast non mass enhancement to check for extent of disease. EXAM: MRI GUIDED CORE NEEDLE BIOPSY OF THE LEFT BREAST TECHNIQUE: Multiplanar, multisequence MR imaging of the left breast was performed both before and after administration of intravenous contrast. CONTRAST:  8 ml Vueway  COMPARISON:  Previous exam(s). FINDINGS: I met with the patient, and we discussed the procedure of MRI guided biopsy, including risks, benefits, and alternatives. Specifically, we discussed the risks of infection, bleeding, tissue injury, clip migration, and inadequate sampling. Informed, written consent was given. The usual time out protocol was performed immediately prior to the procedure. Using sterile technique, 1% Lidocaine , MRI guidance, and a 9 gauge vacuum assisted device, biopsy was performed of left breast non mass enhancement using a lateral to medial approach. At the conclusion of the procedure, a barbell shaped tissue marker clip was deployed into the biopsy cavity. Follow-up 2-view mammogram was performed and dictated separately. IMPRESSION: MRI guided biopsy of the left  breast as above. No apparent complications. Electronically Signed: By: Curtistine Noble On: 08/16/2024 10:57   MM CLIP PLACEMENT LEFT Result Date: 08/16/2024 CLINICAL DATA:  63 year old female status post left breast MRI guided biopsy. EXAM: 3D DIAGNOSTIC LEFT MAMMOGRAM POST MRI BIOPSY COMPARISON:  Previous exam(s). ACR Breast Density Category c: The breasts are heterogeneously dense, which may obscure small masses. FINDINGS: 3D Mammographic images were obtained following MRI guided biopsy of left breast non mass enhancement about the lower central breast. The biopsy marking clip is in expected position at the site of biopsy. IMPRESSION: Appropriate positioning of the barbell shaped biopsy marking clip at the site of biopsy in the left breast as above. Final Assessment: Post Procedure Mammograms for Marker Placement Electronically Signed   By: Curtistine Noble   On: 08/16/2024 12:40     Assessment and plan- Patient is a 63 y.o. female with history of stage II triple negative left breast cancer T2 N0 M0 here for on treatment assessment prior to cycle 5 of weekly CarboTaxol chemotherapy  Assessment and Plan    Triple negative breast cancer of the left lower-outer quadrant Counts okay to proceed with cycle 5 of weekly CarboTaxol chemotherapy today.  She does not require Neupogen so far.  She will directly proceed for cycle 6 of treatment next week and I will see her back in 3 weeks for cycle 7 of weekly CarboTaxol chemotherapy along with Keytruda . - I am reducing the dose of Taxol  to 60 mg/m given her symptoms of ongoing neuropathy. - Clinically no significant decrease in the size of her left breast mass but I am planning to get a repeat ultrasound after 12 weekly cycles of CarboTaxol Keytruda   Chemotherapy-induced peripheral neuropathy Experiencing persistent numbness in extremities due to paclitaxel . -  Reduced paclitaxel  dose in response to neuropathy symptoms.    -Continue cryotherapy  measures     Visit Diagnosis 1. Malignant neoplasm of lower-outer quadrant of left breast of female, estrogen receptor negative (HCC)   2. Encounter for antineoplastic chemotherapy   3. Chemotherapy-induced peripheral neuropathy      Dr. Annah Skene, MD, MPH [facility] [phone] 09/10/2024 9:37 AM                   [1] No Known Allergies [2]  Current Outpatient Medications:    albuterol  (VENTOLIN  HFA) 108 (90 Base) MCG/ACT inhaler, Inhale into the lungs every 6 (six) hours as needed for wheezing or shortness of breath., Disp: , Rfl:    amLODipine  (NORVASC ) 10 MG tablet, TAKE 1 TABLET BY MOUTH EVERY DAY, Disp: 90 tablet, Rfl: 0   aspirin-sod bicarb-citric acid (ALKA-SELTZER) 325 MG TBEF tablet, Take 325 mg by mouth every 6 (six) hours as needed., Disp: , Rfl:    dexamethasone  (DECADRON ) 4 MG tablet, Take 2 tablets daily for 2 days, start the day after chemotherapy. Take with food. (Patient taking differently: as needed. Take 2 tablets daily for 2 days, start the day after chemotherapy. Take with food.), Disp: 30 tablet, Rfl: 1   HYDROcodone -acetaminophen  (NORCO/VICODIN) 5-325 MG tablet, Take 1 tablet by mouth every 6 (six) hours as needed for moderate pain (pain score 4-6)., Disp: 12 tablet, Rfl: 0   lidocaine -prilocaine  (EMLA ) cream, Apply to affected area once, Disp: 30 g, Rfl: 3   ondansetron  (ZOFRAN ) 8 MG tablet, Take 1 tablet (8 mg total) by mouth every 8 (eight) hours as needed for nausea or vomiting. Start on the third day after chemotherapy., Disp: 30 tablet, Rfl: 1   prochlorperazine  (COMPAZINE ) 10 MG tablet, Take 1 tablet (10 mg total) by mouth every 6 (six) hours as needed for nausea or vomiting., Disp: 30 tablet, Rfl: 1   traZODone  (DESYREL ) 50 MG tablet, Take 1 tablet (50 mg total) by mouth at bedtime., Disp: 30 tablet, Rfl: 2

## 2024-09-10 NOTE — Patient Instructions (Signed)
 [facility] A DE[facility]  Discharge Instructions: Thank you for choosing [facility] to provide your oncology and hematology care.  If you have a lab appointment with the Cancer Center, please go directly to the Cancer Center and check in at the registration area.  Wear comfortable clothing and clothing appropriate for easy access to any Portacath or PICC line.   We strive to give you quality time with your provider. You may need to reschedule your appointment if you arrive late (15 or more minutes).  Arriving late affects you and other patients whose appointments are after yours.  Also, if you miss three or more appointments without notifying the office, you may be dismissed from the clinic at the provider's discretion.      For prescription refill requests, have your pharmacy contact our office and allow 72 hours for refills to be completed.    Today you received the following chemotherapy and/or immunotherapy agents Taxol and Carboplatin       To help prevent nausea and vomiting after your treatment, we encourage you to take your nausea medication as directed.  BELOW ARE SYMPTOMS THAT SHOULD BE REPORTED IMMEDIATELY: *FEVER GREATER THAN 100.4 F (38 C) OR HIGHER *CHILLS OR SWEATING *NAUSEA AND VOMITING THAT IS NOT CONTROLLED WITH YOUR NAUSEA MEDICATION *UNUSUAL SHORTNESS OF BREATH *UNUSUAL BRUISING OR BLEEDING *URINARY PROBLEMS (pain or burning when urinating, or frequent urination) *BOWEL PROBLEMS (unusual diarrhea, constipation, pain near the anus) TENDERNESS IN MOUTH AND THROAT WITH OR WITHOUT PRESENCE OF ULCERS (sore throat, sores in mouth, or a toothache) UNUSUAL RASH, SWELLING OR PAIN  UNUSUAL VAGINAL DISCHARGE OR ITCHING   Items with * indicate a potential emergency and should be followed up as soon as possible or go to the Emergency Department if any problems should occur.  Please show the CHEMOTHERAPY ALERT CARD or  IMMUNOTHERAPY ALERT CARD at check-in to the Emergency Department and triage nurse.  Should you have questions after your visit or need to cancel or reschedule your appointment, please contact [facility] - A DEPT OF [facility]  [phone] and follow the prompts.  Office hours are 8:00 a.m. to 4:30 p.m. Monday - Friday. Please note that voicemails left after 4:00 p.m. may not be returned until the following business day.  We are closed weekends and major holidays. You have access to a nurse at all times for urgent questions. Please call the main number to the clinic [phone] and follow the prompts.  For any non-urgent questions, you may also contact your provider using MyChart. We now offer e-Visits for anyone 72 and older to request care online for non-urgent symptoms. For details visit [email or web address].   Also download the MyChart app! Go to the app store, search "MyChart", open the app, select [place], and log in with your MyChart username and password.

## 2024-09-10 NOTE — Progress Notes (Signed)
 Nutrition Assessment   Reason for Assessment:  New chemotherapy- MOC   ASSESSMENT:  63 year old female with left breast cancer, triple negative.   Past medical history of HTN, GERD.  Patient followed by Dr Melanee.  Receiving carboplatin  and keytruda   Met with patient during infusion.  Reports good/normal appetite.  I don't have any problems eating.  Breakfast maybe a pack of crackers.  Lunch is leftovers.  Dinner last night was salad with turkey on it.  Drinks water and propel.  Lives with son and grandson.     Medications: compazine , zofran , dexamethasone    Labs: glucose 138   Anthropometrics:   Weight 188 lb today  183 lb on 10/28 186 lb on 7/22    NUTRITION DIAGNOSIS: None at this time    INTERVENTION:  Encouraged well balanced diet including lean protein and plant foods Contact information provided   MONITORING, EVALUATION, GOAL: weight trends, intake   Next Visit: Friday, Jan 16 during infusion  Ben Sanz B. Dasie SOLON, CSO, LDN Registered Dietitian [phone]

## 2024-09-13 ENCOUNTER — Inpatient Hospital Stay

## 2024-09-13 ENCOUNTER — Inpatient Hospital Stay: Admitting: Oncology

## 2024-09-17 ENCOUNTER — Inpatient Hospital Stay

## 2024-09-17 ENCOUNTER — Inpatient Hospital Stay: Admitting: Oncology

## 2024-09-17 VITALS — BP 117/72 | HR 83 | Temp 96.4°F | Resp 18 | Wt 194.0 lb

## 2024-09-17 DIAGNOSIS — C50512 Malignant neoplasm of lower-outer quadrant of left female breast: Secondary | ICD-10-CM

## 2024-09-17 DIAGNOSIS — Z5112 Encounter for antineoplastic immunotherapy: Secondary | ICD-10-CM | POA: Diagnosis not present

## 2024-09-17 LAB — CBC WITH DIFFERENTIAL (CANCER CENTER ONLY)
Abs Immature Granulocytes: 0.02 K/uL (ref 0.00–0.07)
Basophils Absolute: 0 K/uL (ref 0.0–0.1)
Basophils Relative: 1 %
Eosinophils Absolute: 0.1 K/uL (ref 0.0–0.5)
Eosinophils Relative: 3 %
HCT: 31.2 % — ABNORMAL LOW (ref 36.0–46.0)
Hemoglobin: 10.3 g/dL — ABNORMAL LOW (ref 12.0–15.0)
Immature Granulocytes: 1 %
Lymphocytes Relative: 38 %
Lymphs Abs: 1.4 K/uL (ref 0.7–4.0)
MCH: 29.3 pg (ref 26.0–34.0)
MCHC: 33 g/dL (ref 30.0–36.0)
MCV: 88.9 fL (ref 80.0–100.0)
Monocytes Absolute: 0.3 K/uL (ref 0.1–1.0)
Monocytes Relative: 8 %
Neutro Abs: 1.9 K/uL (ref 1.7–7.7)
Neutrophils Relative %: 49 %
Platelet Count: 222 K/uL (ref 150–400)
RBC: 3.51 MIL/uL — ABNORMAL LOW (ref 3.87–5.11)
RDW: 16.7 % — ABNORMAL HIGH (ref 11.5–15.5)
WBC Count: 3.8 K/uL — ABNORMAL LOW (ref 4.0–10.5)
nRBC: 0 % (ref 0.0–0.2)

## 2024-09-17 LAB — CMP (CANCER CENTER ONLY)
ALT: 18 U/L (ref 0–44)
AST: 18 U/L (ref 15–41)
Albumin: 3.6 g/dL (ref 3.5–5.0)
Alkaline Phosphatase: 73 U/L (ref 38–126)
Anion gap: 9 (ref 5–15)
BUN: 8 mg/dL (ref 8–23)
CO2: 26 mmol/L (ref 22–32)
Calcium: 8.7 mg/dL — ABNORMAL LOW (ref 8.9–10.3)
Chloride: 106 mmol/L (ref 98–111)
Creatinine: 0.63 mg/dL (ref 0.44–1.00)
GFR, Estimated: >60 mL/min
Glucose, Bld: 145 mg/dL — ABNORMAL HIGH (ref 70–99)
Potassium: 4 mmol/L (ref 3.5–5.1)
Sodium: 141 mmol/L (ref 135–145)
Total Bilirubin: 0.3 mg/dL (ref 0.0–1.2)
Total Protein: 5.9 g/dL — ABNORMAL LOW (ref 6.5–8.1)

## 2024-09-17 MED ORDER — SODIUM CHLORIDE 0.9 % IV SOLN
INTRAVENOUS | Status: DC
  Filled 2024-09-17: qty 250

## 2024-09-17 MED ORDER — FAMOTIDINE IN NACL 20-0.9 MG/50ML-% IV SOLN
20.0000 mg | Freq: Once | INTRAVENOUS | Status: AC
  Administered 2024-09-17: 20 mg via INTRAVENOUS
  Filled 2024-09-17: qty 50

## 2024-09-17 MED ORDER — DEXAMETHASONE SOD PHOSPHATE PF 10 MG/ML IJ SOLN
10.0000 mg | Freq: Once | INTRAMUSCULAR | Status: AC
  Administered 2024-09-17: 10 mg via INTRAVENOUS

## 2024-09-17 MED ORDER — DIPHENHYDRAMINE HCL 50 MG/ML IJ SOLN
50.0000 mg | Freq: Once | INTRAMUSCULAR | Status: AC
  Administered 2024-09-17: 50 mg via INTRAVENOUS
  Filled 2024-09-17: qty 1

## 2024-09-17 MED ORDER — SODIUM CHLORIDE 0.9 % IV SOLN
177.6000 mg | Freq: Once | INTRAVENOUS | Status: AC
  Administered 2024-09-17: 180 mg via INTRAVENOUS
  Filled 2024-09-17: qty 18

## 2024-09-17 MED ORDER — SODIUM CHLORIDE 0.9 % IV SOLN
60.0000 mg/m2 | Freq: Once | INTRAVENOUS | Status: AC
  Administered 2024-09-17: 120 mg via INTRAVENOUS
  Filled 2024-09-17: qty 20

## 2024-09-17 MED ORDER — PALONOSETRON HCL INJECTION 0.25 MG/5ML
0.2500 mg | Freq: Once | INTRAVENOUS | Status: AC
  Administered 2024-09-17: 0.25 mg via INTRAVENOUS
  Filled 2024-09-17: qty 5

## 2024-09-17 NOTE — Patient Instructions (Signed)
 [facility]  Discharge Instructions: Thank you for choosing [facility] to provide your oncology and hematology care.  If you have a lab appointment with the Cancer Center, please go directly to the Cancer Center and check in at the registration area.  Wear comfortable clothing and clothing appropriate for easy access to any Portacath or PICC line.   We strive to give you quality time with your provider. You may need to reschedule your appointment if you arrive late (15 or more minutes).  Arriving late affects you and other patients whose appointments are after yours.  Also, if you miss three or more appointments without notifying the office, you may be dismissed from the clinic at the provider's discretion.      For prescription refill requests, have your pharmacy contact our office and allow 72 hours for refills to be completed.    Today you received the following chemotherapy and/or immunotherapy agents Carboplatin and Taxol.      To help prevent nausea and vomiting after your treatment, we encourage you to take your nausea medication as directed.  BELOW ARE SYMPTOMS THAT SHOULD BE REPORTED IMMEDIATELY: *FEVER GREATER THAN 100.4 F (38 C) OR HIGHER *CHILLS OR SWEATING *NAUSEA AND VOMITING THAT IS NOT CONTROLLED WITH YOUR NAUSEA MEDICATION *UNUSUAL SHORTNESS OF BREATH *UNUSUAL BRUISING OR BLEEDING *URINARY PROBLEMS (pain or burning when urinating, or frequent urination) *BOWEL PROBLEMS (unusual diarrhea, constipation, pain near the anus) TENDERNESS IN MOUTH AND THROAT WITH OR WITHOUT PRESENCE OF ULCERS (sore throat, sores in mouth, or a toothache) UNUSUAL RASH, SWELLING OR PAIN  UNUSUAL VAGINAL DISCHARGE OR ITCHING   Items with * indicate a potential emergency and should be followed up as soon as possible or go to the Emergency Department if any problems should occur.  Please show the CHEMOTHERAPY ALERT CARD or  IMMUNOTHERAPY ALERT CARD at check-in to the Emergency Department and triage nurse.  Should you have questions after your visit or need to cancel or reschedule your appointment, please contact [facility] - A DEPT OF [facility]  [phone] and follow the prompts.  Office hours are 8:00 a.m. to 4:30 p.m. Monday - Friday. Please note that voicemails left after 4:00 p.m. may not be returned until the following business day.  We are closed weekends and major holidays. You have access to a nurse at all times for urgent questions. Please call the main number to the clinic [phone] and follow the prompts.  For any non-urgent questions, you may also contact your provider using MyChart. We now offer e-Visits for anyone 36 and older to request care online for non-urgent symptoms. For details visit [email or web address].   Also download the MyChart app! Go to the app store, search "MyChart", open the app, select [place], and log in with your MyChart username and password.

## 2024-09-20 ENCOUNTER — Inpatient Hospital Stay

## 2024-09-20 ENCOUNTER — Inpatient Hospital Stay: Admitting: Oncology

## 2024-09-21 ENCOUNTER — Inpatient Hospital Stay

## 2024-09-22 ENCOUNTER — Inpatient Hospital Stay

## 2024-10-01 ENCOUNTER — Inpatient Hospital Stay: Admitting: Oncology

## 2024-10-01 ENCOUNTER — Inpatient Hospital Stay

## 2024-10-01 ENCOUNTER — Encounter: Payer: Self-pay | Admitting: Oncology

## 2024-10-01 ENCOUNTER — Inpatient Hospital Stay: Attending: Oncology

## 2024-10-01 VITALS — BP 123/71 | HR 83 | Temp 96.5°F | Resp 18 | Ht 71.0 in | Wt 193.2 lb

## 2024-10-01 DIAGNOSIS — Z171 Estrogen receptor negative status [ER-]: Secondary | ICD-10-CM | POA: Insufficient documentation

## 2024-10-01 DIAGNOSIS — Z5111 Encounter for antineoplastic chemotherapy: Secondary | ICD-10-CM | POA: Diagnosis present

## 2024-10-01 DIAGNOSIS — C50512 Malignant neoplasm of lower-outer quadrant of left female breast: Secondary | ICD-10-CM | POA: Insufficient documentation

## 2024-10-01 DIAGNOSIS — Z17421 Hormone receptor negative with human epidermal growth factor receptor 2 negative status: Secondary | ICD-10-CM | POA: Diagnosis not present

## 2024-10-01 DIAGNOSIS — F17210 Nicotine dependence, cigarettes, uncomplicated: Secondary | ICD-10-CM | POA: Diagnosis not present

## 2024-10-01 DIAGNOSIS — Z79899 Other long term (current) drug therapy: Secondary | ICD-10-CM | POA: Insufficient documentation

## 2024-10-01 DIAGNOSIS — Z5112 Encounter for antineoplastic immunotherapy: Secondary | ICD-10-CM | POA: Diagnosis present

## 2024-10-01 DIAGNOSIS — Z1722 Progesterone receptor negative status: Secondary | ICD-10-CM | POA: Diagnosis not present

## 2024-10-01 DIAGNOSIS — Z7952 Long term (current) use of systemic steroids: Secondary | ICD-10-CM | POA: Diagnosis not present

## 2024-10-01 DIAGNOSIS — Z803 Family history of malignant neoplasm of breast: Secondary | ICD-10-CM | POA: Diagnosis not present

## 2024-10-01 LAB — CBC WITH DIFFERENTIAL (CANCER CENTER ONLY)
Abs Immature Granulocytes: 0 K/uL (ref 0.00–0.07)
Basophils Absolute: 0 K/uL (ref 0.0–0.1)
Basophils Relative: 1 %
Eosinophils Absolute: 0.1 K/uL (ref 0.0–0.5)
Eosinophils Relative: 1 %
HCT: 34.9 % — ABNORMAL LOW (ref 36.0–46.0)
Hemoglobin: 11.4 g/dL — ABNORMAL LOW (ref 12.0–15.0)
Immature Granulocytes: 0 %
Lymphocytes Relative: 32 %
Lymphs Abs: 1.2 K/uL (ref 0.7–4.0)
MCH: 29.3 pg (ref 26.0–34.0)
MCHC: 32.7 g/dL (ref 30.0–36.0)
MCV: 89.7 fL (ref 80.0–100.0)
Monocytes Absolute: 0.4 K/uL (ref 0.1–1.0)
Monocytes Relative: 11 %
Neutro Abs: 2.1 K/uL (ref 1.7–7.7)
Neutrophils Relative %: 55 %
Platelet Count: 250 K/uL (ref 150–400)
RBC: 3.89 MIL/uL (ref 3.87–5.11)
RDW: 17.5 % — ABNORMAL HIGH (ref 11.5–15.5)
WBC Count: 3.7 K/uL — ABNORMAL LOW (ref 4.0–10.5)
nRBC: 0 % (ref 0.0–0.2)

## 2024-10-01 LAB — CMP (CANCER CENTER ONLY)
ALT: 30 U/L (ref 0–44)
ALT: 30 U/L (ref 0–44)
AST: 25 U/L (ref 15–41)
AST: 25 U/L (ref 15–41)
Albumin: 3.9 g/dL (ref 3.5–5.0)
Alkaline Phosphatase: 76 U/L (ref 38–126)
Anion gap: 14 (ref 5–15)
BUN: 12 mg/dL (ref 8–23)
CO2: 23 mmol/L (ref 22–32)
Calcium: 9.5 mg/dL (ref 8.9–10.3)
Chloride: 104 mmol/L (ref 98–111)
Creatinine: 0.78 mg/dL (ref 0.44–1.00)
Creatinine: 0.78 mg/dL (ref 0.44–1.00)
GFR, Estimated: >60 mL/min
Glucose, Bld: 120 mg/dL — ABNORMAL HIGH (ref 70–99)
Potassium: 3.7 mmol/L (ref 3.5–5.1)
Sodium: 141 mmol/L (ref 135–145)
Total Bilirubin: 0.5 mg/dL (ref 0.0–1.2)
Total Bilirubin: 0.5 mg/dL (ref 0.0–1.2)
Total Protein: 6.6 g/dL (ref 6.5–8.1)

## 2024-10-01 LAB — TSH: TSH: 2.41 u[IU]/mL (ref 0.350–4.500)

## 2024-10-01 MED ORDER — DEXAMETHASONE SOD PHOSPHATE PF 10 MG/ML IJ SOLN
10.0000 mg | Freq: Once | INTRAMUSCULAR | Status: AC
  Administered 2024-10-01: 10 mg via INTRAVENOUS

## 2024-10-01 MED ORDER — FAMOTIDINE IN NACL 20-0.9 MG/50ML-% IV SOLN
20.0000 mg | Freq: Once | INTRAVENOUS | Status: AC
  Administered 2024-10-01: 20 mg via INTRAVENOUS
  Filled 2024-10-01: qty 50

## 2024-10-01 MED ORDER — SODIUM CHLORIDE 0.9 % IV SOLN
INTRAVENOUS | Status: DC
  Filled 2024-10-01: qty 250

## 2024-10-01 MED ORDER — SODIUM CHLORIDE 0.9 % IV SOLN
177.6000 mg | Freq: Once | INTRAVENOUS | Status: AC
  Administered 2024-10-01: 180 mg via INTRAVENOUS
  Filled 2024-10-01: qty 18

## 2024-10-01 MED ORDER — SODIUM CHLORIDE 0.9 % IV SOLN
60.0000 mg/m2 | Freq: Once | INTRAVENOUS | Status: AC
  Administered 2024-10-01: 120 mg via INTRAVENOUS
  Filled 2024-10-01: qty 20

## 2024-10-01 MED ORDER — PALONOSETRON HCL INJECTION 0.25 MG/5ML
0.2500 mg | Freq: Once | INTRAVENOUS | Status: AC
  Administered 2024-10-01: 0.25 mg via INTRAVENOUS
  Filled 2024-10-01: qty 5

## 2024-10-01 MED ORDER — SODIUM CHLORIDE 0.9 % IV SOLN
200.0000 mg | Freq: Once | INTRAVENOUS | Status: AC
  Administered 2024-10-01: 200 mg via INTRAVENOUS
  Filled 2024-10-01: qty 8

## 2024-10-01 MED ORDER — DIPHENHYDRAMINE HCL 50 MG/ML IJ SOLN
50.0000 mg | Freq: Once | INTRAMUSCULAR | Status: AC
  Administered 2024-10-01: 50 mg via INTRAVENOUS
  Filled 2024-10-01: qty 1

## 2024-10-01 NOTE — Progress Notes (Signed)
 "    Hematology/Oncology Consult note [facility]  [phone] [phone]  Patient Care Team: Antonette Angeline ORN, NP as PCP - General (Internal Medicine) Georgina Shasta POUR, RN as Oncology Nurse Navigator Melanee Annah BROCKS, MD as Consulting Physician (Oncology)   Name of the patient: Amber Figueroa  [id number]  01-19-1961   Date of visit: 10/01/2024  Diagnosis-  Cancer Staging  Malignant neoplasm of lower-outer quadrant of left breast of female, estrogen receptor negative (HCC) Staging form: Breast, AJCC 8th Edition - Clinical stage from 07/27/2024: Stage IIB (cT2, cN0, cM0, G2, ER-, PR-, HER2-) - Signed by Melanee Annah BROCKS, MD on 07/27/2024 Stage prefix: Initial diagnosis Histologic grading system: 3 grade system    Chief complaint/ Reason for visit-on treatment assessment prior to cycle 7 of weekly CarboTaxol chemotherapy along with Keytruda   Heme/Onc history: patient is a 64 year old female with a past medical history significant for hypertension who underwent a bilateral diagnostic mammogram on 07/19/2024 for a palpable left breast lump..  Mammogram showed a 3 cm highly suspicious mass in the left breast.  No left axillary adenopathy.  Small benign right breast cysts.  No evidence of right breast malignancy.  Patient underwent core biopsy of the left breast mass which was consistent with invasive mammary carcinoma grade 2 1.4 cm ER 0% negative, PR 0% negative, Ki-67 25% and HER2 negative +1   Patient lives with her son and grandson.  She works at [employer] and is independent of her ADLs and IADLs.  Menarche at 18.  G2 P2.  Age at first birth 10.  She used birth control pills in the past remotely.  Maternal grandmother with possible history of breast cancer.  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 MRI bilateral breast 07/27/2024  showed 3 cm biopsy-proven breast carcinoma in the inferior aspect of the left breast.  Indeterminate non-mass enhancement extending anteriorly which may represent additional malignancy.  No evidence of right breast malignancy.  No evidence of axillary adenopathy.   Plan is for neoadjuvant chemotherapy for triple negative breast cancer as per keynote 522 regimen  Interval history- Amber Figueroa is a 64 year old female with malignant neoplasm of the breast who presents for ongoing chemotherapy management.  She is currently undergoing weekly carboplatin  and paclitaxel  chemotherapy, with concurrent pembrolizumab , and has completed seven of twelve planned cycles.  She reports no significant adverse effects and states she is tolerating therapy well.  She denies paresthesia or numbness in her hands or feet and is not taking medications for neuropathy. She describes her feet as stable.  No new symptoms or concerns were reported during this visit.  Laboratory studies prior to this visit remain within normal limits.      ECOG PS- 0 Pain scale- 0   Review of systems- Review of Systems  Constitutional:  Negative for chills, fever, malaise/fatigue and weight loss.  HENT:  Negative for congestion, ear discharge and nosebleeds.   Eyes:  Negative for blurred vision.  Respiratory:  Negative for cough, hemoptysis, sputum production, shortness of breath and wheezing.   Cardiovascular:  Negative for chest pain, palpitations, orthopnea and claudication.  Gastrointestinal:  Negative for abdominal pain, blood in stool, constipation, diarrhea, heartburn, melena, nausea and vomiting.  Genitourinary:  Negative for dysuria, flank pain, frequency, hematuria and urgency.  Musculoskeletal:  Negative for back pain, joint pain and  myalgias.  Skin:  Negative for rash.  Neurological:  Positive for sensory change (peripheral neuropathy). Negative for dizziness, tingling, focal weakness, seizures, weakness and headaches.   Endo/Heme/Allergies:  Does not bruise/bleed easily.  Psychiatric/Behavioral:  Negative for depression and suicidal ideas. The patient does not have insomnia.       Allergies[1]   Past Medical History:  Diagnosis Date   Back pain    Complication of anesthesia    slow to wake up with the knee surgery   GERD (gastroesophageal reflux disease)    Hypertension    Malignant neoplasm of lower-outer quadrant of left breast of female, estrogen receptor negative (HCC) 06/2024     Past Surgical History:  Procedure Laterality Date   BREAST BIOPSY Left 07/21/2024   US  LT BREAST BX W LOC DEV 1ST LESION IMG BX SPEC US  GUIDE 07/21/2024 [facility]   COLONOSCOPY WITH PROPOFOL  N/A 07/21/2017   Procedure: COLONOSCOPY WITH PROPOFOL ;  Surgeon: Therisa Bi, MD;  Location: [facility] ENDOSCOPY;  Service: Gastroenterology;  Laterality: N/A;   KNEE SURGERY Left    arthroscopic   PORTACATH PLACEMENT N/A 08/03/2024   Procedure: INSERTION, TUNNELED CENTRAL VENOUS DEVICE, WITH PORT;  Surgeon: Jordis Laneta FALCON, MD;  Location: [facility] ORS;  Service: General;  Laterality: N/A;    Social History   Socioeconomic History   Marital status: Legally Separated    Spouse name: Not on file   Number of children: 2   Years of education: Not on file   Highest education level: Not on file  Occupational History   Occupation: warehouse  Tobacco Use   Smoking status: Every Day    Current packs/day: 1.00    Average packs/day: 1 pack/day for 1 year (1.0 ttl pk-yrs)    Types: Cigarettes    Passive exposure: Past   Smokeless tobacco: Never   Tobacco comments:    age [age] smoked 1/2 ppd, quit x 20 years, now smoking 1 year  Vaping Use   Vaping status: Never Used  Substance and Sexual Activity   Alcohol use: No   Drug use: No   Sexual activity: Never  Other Topics Concern   Not on file  Social History Narrative   Lives with son and grandbaby   Social Drivers of Health   Tobacco Use: High Risk (10/01/2024)    Patient History    Smoking Tobacco Use: Every Day    Smokeless Tobacco Use: Never    Passive Exposure: Past  Financial Resource Strain: Not on file  Food Insecurity: No Food Insecurity (07/27/2024)   Epic    Worried About Programme Researcher, Broadcasting/film/video in the Last Year: Never true    Ran Out of Food in the Last Year: Never true  Transportation Needs: No Transportation Needs (07/27/2024)   Epic    Lack of Transportation (Medical): No    Lack of Transportation (Non-Medical): No  Physical Activity: Not on file  Stress: Not on file  Social Connections: Not on file  Intimate Partner Violence: Not At Risk (07/27/2024)   Epic    Fear of Current or Ex-Partner: No    Emotionally Abused: No    Physically Abused: No    Sexually Abused: No  Depression (PHQ2-9): Low Risk (10/01/2024)   Depression (PHQ2-9)    PHQ-2 Score: 0  Alcohol Screen: Low Risk (09/09/2022)   Alcohol Screen    Last Alcohol Screening Score (AUDIT): 1  Housing: Low Risk (07/27/2024)   Epic    Unable to Pay for Housing in the  Last Year: No    Number of Times Moved in the Last Year: 0    Homeless in the Last Year: No  Utilities: Not At Risk (07/27/2024)   Epic    Threatened with loss of utilities: No  Recent Concern: Utilities - At Risk (07/23/2024)   Epic    Threatened with loss of utilities: Yes  Health Literacy: Not on file    Family History  Problem Relation Age of Onset   Cancer Mother    Bone cancer Father        unknown type   Hypertension Sister    Hypertension Brother    Hyperlipidemia Brother    Hypertension Brother    Diabetes Brother    Hypertension Brother    Breast cancer Maternal Grandmother        likely breast ca - mastectomy   Heart attack Neg Hx    Stroke Neg Hx    Colon cancer Neg Hx    Ovarian cancer Neg Hx     Current Medications[2]  Physical exam:  Vitals:   10/01/24 0933  BP: 123/71  Pulse: 83  Resp: 18  Temp: (!) 96.5 F (35.8 C)  TempSrc: Tympanic  SpO2: 100%  Weight: 193 lb  3.2 oz (87.6 kg)  Height: 5' 11 (1.803 m)   Physical Exam Cardiovascular:     Rate and Rhythm: Normal rate and regular rhythm.     Heart sounds: Normal heart sounds.  Pulmonary:     Effort: Pulmonary effort is normal.     Breath sounds: Normal breath sounds.  Skin:    General: Skin is warm and dry.  Neurological:     Mental Status: She is alert and oriented to person, place, and time.   Breast exam: There is a palpable 3 cm mass in the left breast at the 6 o'clock position unchanged from prior  I have personally reviewed labs listed below:    Latest Ref Rng & Units 09/17/2024    8:26 AM  CMP  Glucose 70 - 99 mg/dL 854   BUN 8 - 23 mg/dL 8   Creatinine 9.55 - 8.99 mg/dL 9.36   Sodium 864 - 854 mmol/L 141   Potassium 3.5 - 5.1 mmol/L 4.0   Chloride 98 - 111 mmol/L 106   CO2 22 - 32 mmol/L 26   Calcium 8.9 - 10.3 mg/dL 8.7   Total Protein 6.5 - 8.1 g/dL 5.9   Total Bilirubin 0.0 - 1.2 mg/dL 0.3   Alkaline Phos 38 - 126 U/L 73   AST 15 - 41 U/L 18   ALT 0 - 44 U/L 18       Latest Ref Rng & Units 10/01/2024    9:07 AM  CBC  WBC 4.0 - 10.5 K/uL 3.7   Hemoglobin 12.0 - 15.0 g/dL 88.5   Hematocrit 63.9 - 46.0 % 34.9   Platelets 150 - 400 K/uL 250      Assessment and plan- Patient is a 64 y.o. 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 She is here for on treatment assesment prior to cycle 7 of weekly carbo/ txol chemo therapy and dose 3 of keytruda   Assessment and Plan    Triple negative left breast cancer undergoing neoadjuvant chemotherapy: Undergoing active treatment, completed 7 of 12 weekly cycles with carboplatin , paclitaxel , and pembrolizumab . Tolerating therapy well without significant adverse effects or neuropathy. -Clinically however her left breast mass is still sizable around 3  cm and has not shrunken meaningfully - Continue weekly chemotherapy with carboplatin  and paclitaxel  as scheduled. - Order follow-up breast ultrasound  after completion of the first half of chemotherapy.  Antineoplastic chemotherapy management Midway through the first half of chemotherapy regimen with no neuropathy or adverse effects. Laboratory studies within acceptable parameters.         Visit Diagnosis 1. Encounter for antineoplastic chemotherapy   2. Malignant neoplasm of lower-outer quadrant of left breast of female, estrogen receptor negative (HCC)      Dr. Annah Skene, MD, MPH [facility] [phone] 10/01/2024 10:53 AM                   [1] No Known Allergies [2]  Current Outpatient Medications:    albuterol  (VENTOLIN  HFA) 108 (90 Base) MCG/ACT inhaler, Inhale into the lungs every 6 (six) hours as needed for wheezing or shortness of breath., Disp: , Rfl:    amLODipine  (NORVASC ) 10 MG tablet, TAKE 1 TABLET BY MOUTH EVERY DAY, Disp: 90 tablet, Rfl: 0   aspirin-sod bicarb-citric acid (ALKA-SELTZER) 325 MG TBEF tablet, Take 325 mg by mouth every 6 (six) hours as needed., Disp: , Rfl:    dexamethasone  (DECADRON ) 4 MG tablet, Take 2 tablets daily for 2 days, start the day after chemotherapy. Take with food. (Patient taking differently: as needed. Take 2 tablets daily for 2 days, start the day after chemotherapy. Take with food.), Disp: 30 tablet, Rfl: 1   HYDROcodone -acetaminophen  (NORCO/VICODIN) 5-325 MG tablet, Take 1 tablet by mouth every 6 (six) hours as needed for moderate pain (pain score 4-6)., Disp: 12 tablet, Rfl: 0   lidocaine -prilocaine  (EMLA ) cream, Apply to affected area once, Disp: 30 g, Rfl: 3   ondansetron  (ZOFRAN ) 8 MG tablet, Take 1 tablet (8 mg total) by mouth every 8 (eight) hours as needed for nausea or vomiting. Start on the third day after chemotherapy., Disp: 30 tablet, Rfl: 1   prochlorperazine  (COMPAZINE ) 10 MG tablet, Take 1 tablet (10 mg total) by mouth every 6 (six) hours as needed for nausea or vomiting., Disp: 30 tablet, Rfl: 1   traZODone  (DESYREL ) 50 MG tablet,  Take 1 tablet (50 mg total) by mouth at bedtime. (Patient not taking: Reported on 10/01/2024), Disp: 30 tablet, Rfl: 2  "

## 2024-10-01 NOTE — Progress Notes (Signed)
 Per Dr. Melanee  OK to treat with CMP pending   Maudie FORBES Andreas, PharmD, BCPS Clinical Pharmacist

## 2024-10-02 LAB — T4: T4, Total: 7.3 ug/dL (ref 4.5–12.0)

## 2024-10-08 ENCOUNTER — Inpatient Hospital Stay

## 2024-10-08 VITALS — BP 129/72 | HR 77 | Temp 96.4°F | Resp 17 | Wt 194.0 lb

## 2024-10-08 DIAGNOSIS — C50512 Malignant neoplasm of lower-outer quadrant of left female breast: Secondary | ICD-10-CM

## 2024-10-08 DIAGNOSIS — Z5112 Encounter for antineoplastic immunotherapy: Secondary | ICD-10-CM | POA: Diagnosis not present

## 2024-10-08 LAB — CMP (CANCER CENTER ONLY)
ALT: 18 U/L (ref 0–44)
AST: 17 U/L (ref 15–41)
Albumin: 3.9 g/dL (ref 3.5–5.0)
Alkaline Phosphatase: 74 U/L (ref 38–126)
Anion gap: 11 (ref 5–15)
BUN: 12 mg/dL (ref 8–23)
CO2: 24 mmol/L (ref 22–32)
Calcium: 9.2 mg/dL (ref 8.9–10.3)
Chloride: 107 mmol/L (ref 98–111)
Creatinine: 0.74 mg/dL (ref 0.44–1.00)
GFR, Estimated: >60 mL/min
Glucose, Bld: 147 mg/dL — ABNORMAL HIGH (ref 70–99)
Potassium: 3.7 mmol/L (ref 3.5–5.1)
Sodium: 142 mmol/L (ref 135–145)
Total Bilirubin: 0.3 mg/dL (ref 0.0–1.2)
Total Protein: 6.3 g/dL — ABNORMAL LOW (ref 6.5–8.1)

## 2024-10-08 LAB — CBC WITH DIFFERENTIAL (CANCER CENTER ONLY)
Abs Immature Granulocytes: 0.01 K/uL (ref 0.00–0.07)
Basophils Absolute: 0 K/uL (ref 0.0–0.1)
Basophils Relative: 0 %
Eosinophils Absolute: 0.1 K/uL (ref 0.0–0.5)
Eosinophils Relative: 3 %
HCT: 34.1 % — ABNORMAL LOW (ref 36.0–46.0)
Hemoglobin: 11.3 g/dL — ABNORMAL LOW (ref 12.0–15.0)
Immature Granulocytes: 0 %
Lymphocytes Relative: 45 %
Lymphs Abs: 2.3 K/uL (ref 0.7–4.0)
MCH: 29.7 pg (ref 26.0–34.0)
MCHC: 33.1 g/dL (ref 30.0–36.0)
MCV: 89.7 fL (ref 80.0–100.0)
Monocytes Absolute: 0.4 K/uL (ref 0.1–1.0)
Monocytes Relative: 8 %
Neutro Abs: 2.2 K/uL (ref 1.7–7.7)
Neutrophils Relative %: 44 %
Platelet Count: 241 K/uL (ref 150–400)
RBC: 3.8 MIL/uL — ABNORMAL LOW (ref 3.87–5.11)
RDW: 17.1 % — ABNORMAL HIGH (ref 11.5–15.5)
WBC Count: 5.1 K/uL (ref 4.0–10.5)
nRBC: 0 % (ref 0.0–0.2)

## 2024-10-08 MED ORDER — DEXAMETHASONE SOD PHOSPHATE PF 10 MG/ML IJ SOLN
10.0000 mg | Freq: Once | INTRAMUSCULAR | Status: AC
  Administered 2024-10-08: 10 mg via INTRAVENOUS

## 2024-10-08 MED ORDER — SODIUM CHLORIDE 0.9 % IV SOLN
INTRAVENOUS | Status: DC
  Filled 2024-10-08: qty 250

## 2024-10-08 MED ORDER — PALONOSETRON HCL INJECTION 0.25 MG/5ML
0.2500 mg | Freq: Once | INTRAVENOUS | Status: AC
  Administered 2024-10-08: 0.25 mg via INTRAVENOUS
  Filled 2024-10-08: qty 5

## 2024-10-08 MED ORDER — FAMOTIDINE IN NACL 20-0.9 MG/50ML-% IV SOLN
20.0000 mg | Freq: Once | INTRAVENOUS | Status: AC
  Administered 2024-10-08: 20 mg via INTRAVENOUS
  Filled 2024-10-08: qty 50

## 2024-10-08 MED ORDER — SODIUM CHLORIDE 0.9 % IV SOLN
60.0000 mg/m2 | Freq: Once | INTRAVENOUS | Status: AC
  Administered 2024-10-08: 120 mg via INTRAVENOUS
  Filled 2024-10-08: qty 20

## 2024-10-08 MED ORDER — DIPHENHYDRAMINE HCL 50 MG/ML IJ SOLN
50.0000 mg | Freq: Once | INTRAMUSCULAR | Status: AC
  Administered 2024-10-08: 50 mg via INTRAVENOUS
  Filled 2024-10-08: qty 1

## 2024-10-08 MED ORDER — SODIUM CHLORIDE 0.9 % IV SOLN
177.6000 mg | Freq: Once | INTRAVENOUS | Status: AC
  Administered 2024-10-08: 180 mg via INTRAVENOUS
  Filled 2024-10-08: qty 18

## 2024-10-11 ENCOUNTER — Other Ambulatory Visit: Payer: Self-pay | Admitting: Oncology

## 2024-10-11 DIAGNOSIS — Z171 Estrogen receptor negative status [ER-]: Secondary | ICD-10-CM

## 2024-10-15 ENCOUNTER — Inpatient Hospital Stay

## 2024-10-15 ENCOUNTER — Encounter: Payer: Self-pay | Admitting: Oncology

## 2024-10-15 ENCOUNTER — Inpatient Hospital Stay: Admitting: Oncology

## 2024-10-15 VITALS — BP 120/72 | HR 85 | Temp 96.3°F | Resp 18 | Ht 71.0 in | Wt 196.1 lb

## 2024-10-15 VITALS — BP 133/70

## 2024-10-15 DIAGNOSIS — Z5111 Encounter for antineoplastic chemotherapy: Secondary | ICD-10-CM

## 2024-10-15 DIAGNOSIS — Z171 Estrogen receptor negative status [ER-]: Secondary | ICD-10-CM | POA: Diagnosis not present

## 2024-10-15 DIAGNOSIS — Z5112 Encounter for antineoplastic immunotherapy: Secondary | ICD-10-CM | POA: Diagnosis not present

## 2024-10-15 DIAGNOSIS — C50512 Malignant neoplasm of lower-outer quadrant of left female breast: Secondary | ICD-10-CM | POA: Diagnosis not present

## 2024-10-15 LAB — CMP (CANCER CENTER ONLY)
ALT: 16 U/L (ref 0–44)
AST: 17 U/L (ref 15–41)
Albumin: 4 g/dL (ref 3.5–5.0)
Alkaline Phosphatase: 70 U/L (ref 38–126)
Anion gap: 8 (ref 5–15)
BUN: 12 mg/dL (ref 8–23)
CO2: 25 mmol/L (ref 22–32)
Calcium: 9.2 mg/dL (ref 8.9–10.3)
Chloride: 109 mmol/L (ref 98–111)
Creatinine: 0.83 mg/dL (ref 0.44–1.00)
GFR, Estimated: >60 mL/min
Glucose, Bld: 91 mg/dL (ref 70–99)
Potassium: 4.2 mmol/L (ref 3.5–5.1)
Sodium: 142 mmol/L (ref 135–145)
Total Bilirubin: 0.4 mg/dL (ref 0.0–1.2)
Total Protein: 6.5 g/dL (ref 6.5–8.1)

## 2024-10-15 LAB — CBC WITH DIFFERENTIAL (CANCER CENTER ONLY)
Abs Immature Granulocytes: 0.02 K/uL (ref 0.00–0.07)
Basophils Absolute: 0 K/uL (ref 0.0–0.1)
Basophils Relative: 1 %
Eosinophils Absolute: 0.2 K/uL (ref 0.0–0.5)
Eosinophils Relative: 3 %
HCT: 33.9 % — ABNORMAL LOW (ref 36.0–46.0)
Hemoglobin: 11 g/dL — ABNORMAL LOW (ref 12.0–15.0)
Immature Granulocytes: 0 %
Lymphocytes Relative: 34 %
Lymphs Abs: 1.8 K/uL (ref 0.7–4.0)
MCH: 29.5 pg (ref 26.0–34.0)
MCHC: 32.4 g/dL (ref 30.0–36.0)
MCV: 90.9 fL (ref 80.0–100.0)
Monocytes Absolute: 0.4 K/uL (ref 0.1–1.0)
Monocytes Relative: 8 %
Neutro Abs: 2.9 K/uL (ref 1.7–7.7)
Neutrophils Relative %: 54 %
Platelet Count: 264 K/uL (ref 150–400)
RBC: 3.73 MIL/uL — ABNORMAL LOW (ref 3.87–5.11)
RDW: 17.4 % — ABNORMAL HIGH (ref 11.5–15.5)
WBC Count: 5.3 K/uL (ref 4.0–10.5)
nRBC: 0 % (ref 0.0–0.2)

## 2024-10-15 MED ORDER — SODIUM CHLORIDE 0.9 % IV SOLN
172.5000 mg | Freq: Once | INTRAVENOUS | Status: AC
  Administered 2024-10-15: 170 mg via INTRAVENOUS
  Filled 2024-10-15: qty 17

## 2024-10-15 MED ORDER — FAMOTIDINE IN NACL 20-0.9 MG/50ML-% IV SOLN
20.0000 mg | Freq: Once | INTRAVENOUS | Status: AC
  Administered 2024-10-15: 20 mg via INTRAVENOUS
  Filled 2024-10-15: qty 50

## 2024-10-15 MED ORDER — DEXAMETHASONE SOD PHOSPHATE PF 10 MG/ML IJ SOLN
10.0000 mg | Freq: Once | INTRAMUSCULAR | Status: AC
  Administered 2024-10-15: 10 mg via INTRAVENOUS
  Filled 2024-10-15: qty 1

## 2024-10-15 MED ORDER — DIPHENHYDRAMINE HCL 50 MG/ML IJ SOLN
50.0000 mg | Freq: Once | INTRAMUSCULAR | Status: AC
  Administered 2024-10-15: 50 mg via INTRAVENOUS
  Filled 2024-10-15: qty 1

## 2024-10-15 MED ORDER — SODIUM CHLORIDE 0.9 % IV SOLN
INTRAVENOUS | Status: DC
  Filled 2024-10-15: qty 250

## 2024-10-15 MED ORDER — SODIUM CHLORIDE 0.9 % IV SOLN
60.0000 mg/m2 | Freq: Once | INTRAVENOUS | Status: AC
  Administered 2024-10-15: 120 mg via INTRAVENOUS
  Filled 2024-10-15: qty 20

## 2024-10-15 MED ORDER — PALONOSETRON HCL INJECTION 0.25 MG/5ML
0.2500 mg | Freq: Once | INTRAVENOUS | Status: AC
  Administered 2024-10-15: 0.25 mg via INTRAVENOUS
  Filled 2024-10-15: qty 5

## 2024-10-15 NOTE — Progress Notes (Signed)
 Nutrition Follow-up:  Patient with left breast cancer, triple negative.  Receiving chemotherapy  Met with patient during infusion.  Reports that appetite is good.  Denies any nutrition impact symptoms.  Reports that she is gaining weight    Medications: reviewed  Labs: reviewed  Anthropometrics:   Weight 196 lb  188 lb on 12/12 183 lb on 10/28 186 lb on 7/22   NUTRITION DIAGNOSIS: none at this time    INTERVENTION:  Encouraged well balanced diet including lean protein and plant foods. Handout provided on this.  Contact information provided     NEXT VISIT: RD available as needed  Vista Sawatzky B. Dasie SOLON, CSO, LDN Registered Dietitian [phone]

## 2024-10-15 NOTE — Patient Instructions (Signed)
 [facility] - A DEPT OF [facility]  Discharge Instructions: Thank you for choosing [facility] to provide your oncology and hematology care.  If you have a lab appointment with the Cancer Center, please go directly to the Cancer Center and check in at the registration area.  Wear comfortable clothing and clothing appropriate for easy access to any Portacath or PICC line.   We strive to give you quality time with your provider. You may need to reschedule your appointment if you arrive late (15 or more minutes).  Arriving late affects you and other patients whose appointments are after yours.  Also, if you miss three or more appointments without notifying the office, you may be dismissed from the clinic at the provider's discretion.      For prescription refill requests, have your pharmacy contact our office and allow 72 hours for refills to be completed.    Today you received the following chemotherapy and/or immunotherapy agents TAXOL and CARBOPLATIN       To help prevent nausea and vomiting after your treatment, we encourage you to take your nausea medication as directed.  BELOW ARE SYMPTOMS THAT SHOULD BE REPORTED IMMEDIATELY: *FEVER GREATER THAN 100.4 F (38 C) OR HIGHER *CHILLS OR SWEATING *NAUSEA AND VOMITING THAT IS NOT CONTROLLED WITH YOUR NAUSEA MEDICATION *UNUSUAL SHORTNESS OF BREATH *UNUSUAL BRUISING OR BLEEDING *URINARY PROBLEMS (pain or burning when urinating, or frequent urination) *BOWEL PROBLEMS (unusual diarrhea, constipation, pain near the anus) TENDERNESS IN MOUTH AND THROAT WITH OR WITHOUT PRESENCE OF ULCERS (sore throat, sores in mouth, or a toothache) UNUSUAL RASH, SWELLING OR PAIN  UNUSUAL VAGINAL DISCHARGE OR ITCHING   Items with * indicate a potential emergency and should be followed up as soon as possible or go to the Emergency Department if any problems should occur.  Please show the CHEMOTHERAPY ALERT CARD or  IMMUNOTHERAPY ALERT CARD at check-in to the Emergency Department and triage nurse.  Should you have questions after your visit or need to cancel or reschedule your appointment, please contact [facility] - A DEPT OF [facility]  [phone] and follow the prompts.  Office hours are 8:00 a.m. to 4:30 p.m. Monday - Friday. Please note that voicemails left after 4:00 p.m. may not be returned until the following business day.  We are closed weekends and major holidays. You have access to a nurse at all times for urgent questions. Please call the main number to the clinic [phone] and follow the prompts.  For any non-urgent questions, you may also contact your provider using MyChart. We now offer e-Visits for anyone 83 and older to request care online for non-urgent symptoms. For details visit [email or web address].   Also download the MyChart app! Go to the app store, search "MyChart", open the app, select [facility], and log in with your MyChart username and password.  Paclitaxel Injection What is this medication? PACLITAXEL (PAK li TAX el) treats some types of cancer. It works by slowing down the growth of cancer cells. This medicine may be used for other purposes; ask your health care provider or pharmacist if you have questions. COMMON BRAND NAME(S): Onxol, Taxol What should I tell my care team before I take this medication? They need to know if you have any of these conditions: Heart disease Liver disease Low white blood cell levels An unusual or allergic reaction to paclitaxel, other medications, foods, dyes, or preservatives  If you or your partner are pregnant or trying to get pregnant Breast-feeding How should I use this medication? This medication is injected into a vein. It is given by your care team in a hospital or clinic setting. Talk to your care team about the use of this medication in children. While it may be given to children for selected  conditions, precautions do apply. Overdosage: If you think you have taken too much of this medicine contact a poison control center or emergency room at once. NOTE: This medicine is only for you. Do not share this medicine with others. What if I miss a dose? Keep appointments for follow-up doses. It is important not to miss your dose. Call your care team if you are unable to keep an appointment. What may interact with this medication? Do not take this medication with any of the following: Live virus vaccines Other medications may affect the way this medication works. Talk with your care team about all of the medications you take. They may suggest changes to your treatment plan to lower the risk of side effects and to make sure your medications work as intended. This list may not describe all possible interactions. Give your health care provider a list of all the medicines, herbs, non-prescription drugs, or dietary supplements you use. Also tell them if you smoke, drink alcohol, or use illegal drugs. Some items may interact with your medicine. What should I watch for while using this medication? Your condition will be monitored carefully while you are receiving this medication. You may need blood work while taking this medication. This medication may make you feel generally unwell. This is not uncommon as chemotherapy can affect healthy cells as well as cancer cells. Report any side effects. Continue your course of treatment even though you feel ill unless your care team tells you to stop. This medication can cause serious allergic reactions. To reduce the risk, your care team may give you other medications to take before receiving this one. Be sure to follow the directions from your care team. This medication may increase your risk of getting an infection. Call your care team for advice if you get a fever, chills, sore throat, or other symptoms of a cold or flu. Do not treat yourself. Try to avoid  being around people who are sick. This medication may increase your risk to bruise or bleed. Call your care team if you notice any unusual bleeding. Be careful brushing or flossing your teeth or using a toothpick because you may get an infection or bleed more easily. If you have any dental work done, tell your dentist you are receiving this medication. Talk to your care team if you may be pregnant. Serious birth defects can occur if you take this medication during pregnancy. Talk to your care team before breastfeeding. Changes to your treatment plan may be needed. What side effects may I notice from receiving this medication? Side effects that you should report to your care team as soon as possible: Allergic reactions--skin rash, itching, hives, swelling of the face, lips, tongue, or throat Heart rhythm changes--fast or irregular heartbeat, dizziness, feeling faint or lightheaded, chest pain, trouble breathing Increase in blood pressure Infection--fever, chills, cough, sore throat, wounds that don't heal, pain or trouble when passing urine, general feeling of discomfort or being unwell Low blood pressure--dizziness, feeling faint or lightheaded, blurry vision Low red blood cell level--unusual weakness or fatigue, dizziness, headache, trouble breathing Painful swelling, warmth, or redness of the skin, blisters  or sores at the infusion site Pain, tingling, or numbness in the hands or feet Slow heartbeat--dizziness, feeling faint or lightheaded, confusion, trouble breathing, unusual weakness or fatigue Unusual bruising or bleeding Side effects that usually do not require medical attention (report to your care team if they continue or are bothersome): Diarrhea Hair loss Joint pain Loss of appetite Muscle pain Nausea Vomiting This list may not describe all possible side effects. Call your doctor for medical advice about side effects. You may report side effects to FDA at 1-800-FDA-1088. Where  should I keep my medication? This medication is given in a hospital or clinic. It will not be stored at home. NOTE: This sheet is a summary. It may not cover all possible information. If you have questions about this medicine, talk to your doctor, pharmacist, or health care provider.  2024 Elsevier/Gold Standard (2022-02-05 00:00:00)  Carboplatin Injection What is this medication? CARBOPLATIN (KAR boe pla tin) treats some types of cancer. It works by slowing down the growth of cancer cells. This medicine may be used for other purposes; ask your health care provider or pharmacist if you have questions. COMMON BRAND NAME(S): Paraplatin What should I tell my care team before I take this medication? They need to know if you have any of these conditions: Blood disorders Hearing problems Kidney disease Recent or ongoing radiation therapy An unusual or allergic reaction to carboplatin, cisplatin, other medications, foods, dyes, or preservatives Pregnant or trying to get pregnant Breast-feeding How should I use this medication? This medication is injected into a vein. It is given by your care team in a hospital or clinic setting. Talk to your care team about the use of this medication in children. Special care may be needed. Overdosage: If you think you have taken too much of this medicine contact a poison control center or emergency room at once. NOTE: This medicine is only for you. Do not share this medicine with others. What if I miss a dose? Keep appointments for follow-up doses. It is important not to miss your dose. Call your care team if you are unable to keep an appointment. What may interact with this medication? Medications for seizures Some antibiotics, such as amikacin, gentamicin, neomycin, streptomycin, tobramycin Vaccines This list may not describe all possible interactions. Give your health care provider a list of all the medicines, herbs, non-prescription drugs, or dietary  supplements you use. Also tell them if you smoke, drink alcohol, or use illegal drugs. Some items may interact with your medicine. What should I watch for while using this medication? Your condition will be monitored carefully while you are receiving this medication. You may need blood work while taking this medication. This medication may make you feel generally unwell. This is not uncommon, as chemotherapy can affect healthy cells as well as cancer cells. Report any side effects. Continue your course of treatment even though you feel ill unless your care team tells you to stop. In some cases, you may be given additional medications to help with side effects. Follow all directions for their use. This medication may increase your risk of getting an infection. Call your care team for advice if you get a fever, chills, sore throat, or other symptoms of a cold or flu. Do not treat yourself. Try to avoid being around people who are sick. Avoid taking medications that contain aspirin, acetaminophen, ibuprofen, naproxen, or ketoprofen unless instructed by your care team. These medications may hide a fever. Be careful brushing or flossing your  teeth or using a toothpick because you may get an infection or bleed more easily. If you have any dental work done, tell your dentist you are receiving this medication. Talk to your care team if you wish to become pregnant or think you might be pregnant. This medication can cause serious birth defects. Talk to your care team about effective forms of contraception. Do not breast-feed while taking this medication. What side effects may I notice from receiving this medication? Side effects that you should report to your care team as soon as possible: Allergic reactions--skin rash, itching, hives, swelling of the face, lips, tongue, or throat Infection--fever, chills, cough, sore throat, wounds that don't heal, pain or trouble when passing urine, general feeling of  discomfort or being unwell Low red blood cell level--unusual weakness or fatigue, dizziness, headache, trouble breathing Pain, tingling, or numbness in the hands or feet, muscle weakness, change in vision, confusion or trouble speaking, loss of balance or coordination, trouble walking, seizures Unusual bruising or bleeding Side effects that usually do not require medical attention (report to your care team if they continue or are bothersome): Hair loss Nausea Unusual weakness or fatigue Vomiting This list may not describe all possible side effects. Call your doctor for medical advice about side effects. You may report side effects to FDA at 1-800-FDA-1088. Where should I keep my medication? This medication is given in a hospital or clinic. It will not be stored at home. NOTE: This sheet is a summary. It may not cover all possible information. If you have questions about this medicine, talk to your doctor, pharmacist, or health care provider.  2024 Elsevier/Gold Standard (2022-01-08 00:00:00)

## 2024-10-15 NOTE — Progress Notes (Signed)
 Patient feeling okay; no new or acute concerns at this time.

## 2024-10-15 NOTE — Progress Notes (Signed)
 "    Hematology/Oncology Consult note [facility]  [phone] [phone]  Patient Care Team: Antonette Angeline ORN, NP as PCP - General (Internal Medicine) Georgina Shasta POUR, RN as Oncology Nurse Navigator Melanee Annah BROCKS, MD as Consulting Physician (Oncology)   Name of the patient: Amber Figueroa  [id number]  [birth year]   Date of visit: 10/15/24  Diagnosis-  Cancer Staging  Malignant neoplasm of lower-outer quadrant of left breast of female, estrogen receptor negative (HCC) Staging form: Breast, AJCC 8th Edition - Clinical stage from 07/27/2024: Stage IIB (cT2, cN0, cM0, G2, ER-, PR-, HER2-) - Signed by Melanee Annah BROCKS, MD on 07/27/2024 Stage prefix: Initial diagnosis Histologic grading system: 3 grade system    Chief complaint/ Reason for visit-on treatment assessment prior to cycle 9 of weekly CarboTaxol chemotherapy  Heme/Onc history: patient is a 64 year old female with a past medical history significant for hypertension who underwent a bilateral diagnostic mammogram on 07/19/2024 for a palpable left breast lump..  Mammogram showed a 3 cm highly suspicious mass in the left breast.  No left axillary adenopathy.  Small benign right breast cysts.  No evidence of right breast malignancy.  Patient underwent core biopsy of the left breast mass which was consistent with invasive mammary carcinoma grade 2 1.4 cm ER 0% negative, PR 0% negative, Ki-67 25% and HER2 negative +1   Patient lives with her son and grandson.  She works at [employer] and is independent of her ADLs and IADLs.  Menarche at 34.  G2 P2.  Age at first birth 21.  She used birth control pills in the past remotely.  Maternal grandmother with possible history of breast cancer.  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 MRI bilateral breast 07/27/2024 showed 3 cm  biopsy-proven breast carcinoma in the inferior aspect of the left breast.  Indeterminate non-mass enhancement extending anteriorly which may represent additional malignancy.  No evidence of right breast malignancy.  No evidence of axillary adenopathy.   Plan is for neoadjuvant chemotherapy for triple negative breast cancer as per keynote 522 regimen    Interval history- Amber Figueroa is a 64 year old female with malignant neoplasm of the left breast presenting for routine follow-up during neoadjuvant chemotherapy.  She is currently undergoing weekly neoadjuvant chemotherapy and has completed eight of twelve planned treatments.  She experiences mild nausea without vomiting. She denies paresthesia except for transient tingling and numbness in her extremities when exposed to cold temperatures. She otherwise feels well and has not reported any additional symptoms since her last visit.       ECOG PS- 0 Pain scale- 0   Review of systems- Review of Systems  Constitutional:  Negative for chills, fever, malaise/fatigue and weight loss.  HENT:  Negative for congestion, ear discharge and nosebleeds.   Eyes:  Negative for blurred vision.  Respiratory:  Negative for cough, hemoptysis, sputum production, shortness of breath and wheezing.   Cardiovascular:  Negative for chest pain, palpitations, orthopnea and claudication.  Gastrointestinal:  Negative for abdominal pain, blood in stool, constipation, diarrhea, heartburn, melena, nausea and vomiting.  Genitourinary:  Negative for dysuria, flank pain, frequency, hematuria and urgency.  Musculoskeletal:  Negative for back pain, joint pain and myalgias.  Skin:  Negative for rash.  Neurological:  Negative for dizziness, tingling, focal weakness, seizures, weakness and headaches.  Endo/Heme/Allergies:  Does  not bruise/bleed easily.  Psychiatric/Behavioral:  Negative for depression and suicidal ideas. The patient does not have insomnia.        Allergies[1]   Past Medical History:  Diagnosis Date   Back pain    Complication of anesthesia    slow to wake up with the knee surgery   GERD (gastroesophageal reflux disease)    Hypertension    Malignant neoplasm of lower-outer quadrant of left breast of female, estrogen receptor negative (HCC) 06/2024     Past Surgical History:  Procedure Laterality Date   BREAST BIOPSY Left 07/21/2024   US  LT BREAST BX W LOC DEV 1ST LESION IMG BX SPEC US  GUIDE 07/21/2024 [facility]   COLONOSCOPY WITH PROPOFOL  N/A 07/21/2017   Procedure: COLONOSCOPY WITH PROPOFOL ;  Surgeon: Therisa Bi, MD;  Location: [facility] ENDOSCOPY;  Service: Gastroenterology;  Laterality: N/A;   KNEE SURGERY Left    arthroscopic   PORTACATH PLACEMENT N/A 08/03/2024   Procedure: INSERTION, TUNNELED CENTRAL VENOUS DEVICE, WITH PORT;  Surgeon: Jordis Laneta FALCON, MD;  Location: [facility] ORS;  Service: General;  Laterality: N/A;    Social History   Socioeconomic History   Marital status: Legally Separated    Spouse name: Not on file   Number of children: 2   Years of education: Not on file   Highest education level: Not on file  Occupational History   Occupation: warehouse  Tobacco Use   Smoking status: Every Day    Current packs/day: 1.00    Average packs/day: 1 pack/day for 1 year (1.0 ttl pk-yrs)    Types: Cigarettes    Passive exposure: Past   Smokeless tobacco: Never   Tobacco comments:    age [age] smoked 1/2 ppd, quit x 20 years, now smoking 1 year  Vaping Use   Vaping status: Never Used  Substance and Sexual Activity   Alcohol use: No   Drug use: No   Sexual activity: Never  Other Topics Concern   Not on file  Social History Narrative   Lives with son and grandbaby   Social Drivers of Health   Tobacco Use: High Risk (10/15/2024)   Patient History    Smoking Tobacco Use: Every Day    Smokeless Tobacco Use: Never    Passive Exposure: Past  Financial Resource Strain: Not on file  Food  Insecurity: No Food Insecurity (07/27/2024)   Epic    Worried About Programme Researcher, Broadcasting/film/video in the Last Year: Never true    Ran Out of Food in the Last Year: Never true  Transportation Needs: No Transportation Needs (07/27/2024)   Epic    Lack of Transportation (Medical): No    Lack of Transportation (Non-Medical): No  Physical Activity: Not on file  Stress: Not on file  Social Connections: Not on file  Intimate Partner Violence: Not At Risk (07/27/2024)   Epic    Fear of Current or Ex-Partner: No    Emotionally Abused: No    Physically Abused: No    Sexually Abused: No  Depression (PHQ2-9): Low Risk (10/15/2024)   Depression (PHQ2-9)    PHQ-2 Score: 0  Alcohol Screen: Low Risk (09/09/2022)   Alcohol Screen    Last Alcohol Screening Score (AUDIT): 1  Housing: Low Risk (07/27/2024)   Epic    Unable to Pay for Housing in the Last Year: No    Number of Times Moved in the Last Year: 0    Homeless in the Last Year: No  Utilities: Not At Risk (07/27/2024)  Epic    Threatened with loss of utilities: No  Recent Concern: Utilities - At Risk (07/23/2024)   Epic    Threatened with loss of utilities: Yes  Health Literacy: Not on file    Family History  Problem Relation Age of Onset   Cancer Mother    Bone cancer Father        unknown type   Hypertension Sister    Hypertension Brother    Hyperlipidemia Brother    Hypertension Brother    Diabetes Brother    Hypertension Brother    Breast cancer Maternal Grandmother        likely breast ca - mastectomy   Heart attack Neg Hx    Stroke Neg Hx    Colon cancer Neg Hx    Ovarian cancer Neg Hx     Current Medications[2]  Physical exam:  Vitals:   10/15/24 0853  BP: 120/72  Pulse: 85  Resp: 18  Temp: (!) 96.3 F (35.7 C)  TempSrc: Tympanic  SpO2: 100%  Weight: 196 lb 1.6 oz (89 kg)  Height: 5' 11 (1.803 m)   Physical Exam Cardiovascular:     Rate and Rhythm: Normal rate and regular rhythm.     Heart sounds: Normal  heart sounds.  Pulmonary:     Effort: Pulmonary effort is normal.     Breath sounds: Normal breath sounds.  Skin:    General: Skin is warm and dry.  Neurological:     Mental Status: She is alert and oriented to person, place, and time.   Breast exam: Left breast mass overall appears smaller in size today and softer to palpation  I have personally reviewed labs listed below:    Latest Ref Rng & Units 10/15/2024    8:34 AM  CMP  Glucose 70 - 99 mg/dL 91   BUN 8 - 23 mg/dL 12   Creatinine 9.55 - 1.00 mg/dL 9.16   Sodium 864 - 854 mmol/L 142   Potassium 3.5 - 5.1 mmol/L 4.2   Chloride 98 - 111 mmol/L 109   CO2 22 - 32 mmol/L 25   Calcium 8.9 - 10.3 mg/dL 9.2   Total Protein 6.5 - 8.1 g/dL 6.5   Total Bilirubin 0.0 - 1.2 mg/dL 0.4   Alkaline Phos 38 - 126 U/L 70   AST 15 - 41 U/L 17   ALT 0 - 44 U/L 16       Latest Ref Rng & Units 10/15/2024    8:34 AM  CBC  WBC 4.0 - 10.5 K/uL 5.3   Hemoglobin 12.0 - 15.0 g/dL 88.9   Hematocrit 63.9 - 46.0 % 33.9   Platelets 150 - 400 K/uL 264      Assessment and plan- Patient is a 64 y.o. 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  She is here for on treatment assessment prior to cycle 9 of weekly CarboTaxol chemotherapy  Assessment and Plan    Malignant neoplasm of lower-outer quadrant of left breast Undergoing chemotherapy with nine of twelve treatments completed.  Counts okay to proceed with cycle 9 of weekly CarboTaxol chemotherapy today.  She has not required any growth factor support so far.  Tolerating therapy well with mild nausea and cold-induced neuropathy.  Clinically breast mass appears to be shrinking  - Continue current chemotherapy regimen to complete 12 weekly treatments.  I will see her back in 1 week for cycle 10 of weekly CarboTaxol chemotherapy along  with Keytruda  - Order follow-up ultrasound after completion of 12 treatments to assess response to therapy. - If no evidence of disease  progression, proceed to second phase: 4 additional cycles of chemotherapy with AC Keytruda  administered every three weeks.         Visit Diagnosis 1. Malignant neoplasm of lower-outer quadrant of left breast of female, estrogen receptor negative (HCC)   2. Encounter for antineoplastic chemotherapy      Dr. Annah Skene, MD, MPH [facility] [phone] 10/15/2024 11:17 AM                   [1] No Known Allergies [2]  Current Outpatient Medications:    albuterol  (VENTOLIN  HFA) 108 (90 Base) MCG/ACT inhaler, Inhale into the lungs every 6 (six) hours as needed for wheezing or shortness of breath., Disp: , Rfl:    amLODipine  (NORVASC ) 10 MG tablet, TAKE 1 TABLET BY MOUTH EVERY DAY, Disp: 90 tablet, Rfl: 0   aspirin-sod bicarb-citric acid (ALKA-SELTZER) 325 MG TBEF tablet, Take 325 mg by mouth every 6 (six) hours as needed., Disp: , Rfl:    dexamethasone  (DECADRON ) 4 MG tablet, Take 2 tablets daily for 2 days, start the day after chemotherapy. Take with food. (Patient taking differently: as needed. Take 2 tablets daily for 2 days, start the day after chemotherapy. Take with food.), Disp: 30 tablet, Rfl: 1   HYDROcodone -acetaminophen  (NORCO/VICODIN) 5-325 MG tablet, Take 1 tablet by mouth every 6 (six) hours as needed for moderate pain (pain score 4-6)., Disp: 12 tablet, Rfl: 0   lidocaine -prilocaine  (EMLA ) cream, Apply to affected area once, Disp: 30 g, Rfl: 3   ondansetron  (ZOFRAN ) 8 MG tablet, Take 1 tablet (8 mg total) by mouth every 8 (eight) hours as needed for nausea or vomiting. Start on the third day after chemotherapy., Disp: 30 tablet, Rfl: 1   prochlorperazine  (COMPAZINE ) 10 MG tablet, Take 1 tablet (10 mg total) by mouth every 6 (six) hours as needed for nausea or vomiting., Disp: 30 tablet, Rfl: 1   traZODone  (DESYREL ) 50 MG tablet, TAKE 1 TABLET BY MOUTH EVERYDAY AT BEDTIME (Patient not taking: Reported on 10/15/2024), Disp: 90 tablet, Rfl:  1 No current facility-administered medications for this visit.  Facility-Administered Medications Ordered in Other Visits:    0.9 %  sodium chloride  infusion, , Intravenous, Continuous, Skene Annah BROCKS, MD, Last Rate: 10 mL/hr at 10/15/24 0950, New Bag at 10/15/24 0950   CARBOplatin  (PARAPLATIN ) 170 mg in sodium chloride  0.9 % 100 mL chemo infusion, 170 mg, Intravenous, Once, Skene Annah BROCKS, MD   PACLitaxel  (TAXOL ) 120 mg in sodium chloride  0.9 % 250 mL chemo infusion (</= 80mg /m2), 60 mg/m2 (Treatment Plan Recorded), Intravenous, Once, Skene Annah BROCKS, MD, Last Rate: 270 mL/hr at 10/15/24 1039, 120 mg at 10/15/24 1039  "

## 2024-10-18 ENCOUNTER — Inpatient Hospital Stay

## 2024-10-19 ENCOUNTER — Inpatient Hospital Stay

## 2024-10-20 ENCOUNTER — Inpatient Hospital Stay

## 2024-10-22 ENCOUNTER — Encounter: Payer: Self-pay | Admitting: Oncology

## 2024-10-22 ENCOUNTER — Inpatient Hospital Stay

## 2024-10-22 ENCOUNTER — Inpatient Hospital Stay: Admitting: Oncology

## 2024-10-22 ENCOUNTER — Other Ambulatory Visit: Payer: Self-pay | Admitting: Internal Medicine

## 2024-10-22 VITALS — BP 102/67 | HR 95 | Temp 97.0°F | Resp 18 | Ht 71.0 in | Wt 198.0 lb

## 2024-10-22 DIAGNOSIS — Z5112 Encounter for antineoplastic immunotherapy: Secondary | ICD-10-CM | POA: Diagnosis not present

## 2024-10-22 DIAGNOSIS — C50512 Malignant neoplasm of lower-outer quadrant of left female breast: Secondary | ICD-10-CM

## 2024-10-22 DIAGNOSIS — Z5111 Encounter for antineoplastic chemotherapy: Secondary | ICD-10-CM

## 2024-10-22 DIAGNOSIS — Z171 Estrogen receptor negative status [ER-]: Secondary | ICD-10-CM | POA: Diagnosis not present

## 2024-10-22 LAB — CBC WITH DIFFERENTIAL (CANCER CENTER ONLY)
Abs Immature Granulocytes: 0.02 K/uL (ref 0.00–0.07)
Basophils Absolute: 0 K/uL (ref 0.0–0.1)
Basophils Relative: 1 %
Eosinophils Absolute: 0.1 K/uL (ref 0.0–0.5)
Eosinophils Relative: 2 %
HCT: 33.6 % — ABNORMAL LOW (ref 36.0–46.0)
Hemoglobin: 11 g/dL — ABNORMAL LOW (ref 12.0–15.0)
Immature Granulocytes: 1 %
Lymphocytes Relative: 41 %
Lymphs Abs: 1.7 K/uL (ref 0.7–4.0)
MCH: 30.1 pg (ref 26.0–34.0)
MCHC: 32.7 g/dL (ref 30.0–36.0)
MCV: 92.1 fL (ref 80.0–100.0)
Monocytes Absolute: 0.4 K/uL (ref 0.1–1.0)
Monocytes Relative: 9 %
Neutro Abs: 2 K/uL (ref 1.7–7.7)
Neutrophils Relative %: 46 %
Platelet Count: 234 K/uL (ref 150–400)
RBC: 3.65 MIL/uL — ABNORMAL LOW (ref 3.87–5.11)
RDW: 17.9 % — ABNORMAL HIGH (ref 11.5–15.5)
WBC Count: 4.2 K/uL (ref 4.0–10.5)
nRBC: 0 % (ref 0.0–0.2)

## 2024-10-22 LAB — CMP (CANCER CENTER ONLY)
ALT: 20 U/L (ref 0–44)
AST: 19 U/L (ref 15–41)
Albumin: 3.8 g/dL (ref 3.5–5.0)
Alkaline Phosphatase: 69 U/L (ref 38–126)
Anion gap: 9 (ref 5–15)
BUN: 13 mg/dL (ref 8–23)
CO2: 26 mmol/L (ref 22–32)
Calcium: 9.3 mg/dL (ref 8.9–10.3)
Chloride: 107 mmol/L (ref 98–111)
Creatinine: 0.78 mg/dL (ref 0.44–1.00)
GFR, Estimated: >60 mL/min
Glucose, Bld: 94 mg/dL (ref 70–99)
Potassium: 4.1 mmol/L (ref 3.5–5.1)
Sodium: 142 mmol/L (ref 135–145)
Total Bilirubin: 0.3 mg/dL (ref 0.0–1.2)
Total Protein: 6.3 g/dL — ABNORMAL LOW (ref 6.5–8.1)

## 2024-10-22 MED ORDER — PALONOSETRON HCL INJECTION 0.25 MG/5ML
0.2500 mg | Freq: Once | INTRAVENOUS | Status: AC
  Administered 2024-10-22: 0.25 mg via INTRAVENOUS
  Filled 2024-10-22: qty 5

## 2024-10-22 MED ORDER — FAMOTIDINE IN NACL 20-0.9 MG/50ML-% IV SOLN
20.0000 mg | Freq: Once | INTRAVENOUS | Status: AC
  Administered 2024-10-22: 20 mg via INTRAVENOUS
  Filled 2024-10-22: qty 50

## 2024-10-22 MED ORDER — SODIUM CHLORIDE 0.9 % IV SOLN
200.0000 mg | Freq: Once | INTRAVENOUS | Status: AC
  Administered 2024-10-22: 200 mg via INTRAVENOUS
  Filled 2024-10-22: qty 8

## 2024-10-22 MED ORDER — SODIUM CHLORIDE 0.9 % IV SOLN
177.6000 mg | Freq: Once | INTRAVENOUS | Status: AC
  Administered 2024-10-22: 180 mg via INTRAVENOUS
  Filled 2024-10-22: qty 18

## 2024-10-22 MED ORDER — SODIUM CHLORIDE 0.9 % IV SOLN
INTRAVENOUS | Status: DC
  Filled 2024-10-22: qty 250

## 2024-10-22 MED ORDER — DEXAMETHASONE SOD PHOSPHATE PF 10 MG/ML IJ SOLN
10.0000 mg | Freq: Once | INTRAMUSCULAR | Status: AC
  Administered 2024-10-22: 10 mg via INTRAVENOUS
  Filled 2024-10-22: qty 1

## 2024-10-22 MED ORDER — DIPHENHYDRAMINE HCL 50 MG/ML IJ SOLN
50.0000 mg | Freq: Once | INTRAMUSCULAR | Status: AC
  Administered 2024-10-22: 50 mg via INTRAVENOUS
  Filled 2024-10-22: qty 1

## 2024-10-22 MED ORDER — SODIUM CHLORIDE 0.9 % IV SOLN
60.0000 mg/m2 | Freq: Once | INTRAVENOUS | Status: AC
  Administered 2024-10-22: 120 mg via INTRAVENOUS
  Filled 2024-10-22: qty 20

## 2024-10-22 NOTE — Patient Instructions (Signed)
 [facility] - A DEPT OF [facility]  Discharge Instructions: Thank you for choosing [facility] to provide your oncology and hematology care.  If you have a lab appointment with the Cancer Center, please go directly to the Cancer Center and check in at the registration area.  Wear comfortable clothing and clothing appropriate for easy access to any Portacath or PICC line.   We strive to give you quality time with your provider. You may need to reschedule your appointment if you arrive late (15 or more minutes).  Arriving late affects you and other patients whose appointments are after yours.  Also, if you miss three or more appointments without notifying the office, you may be dismissed from the clinic at the providers discretion.      For prescription refill requests, have your pharmacy contact our office and allow 72 hours for refills to be completed.    Today you received the following chemotherapy and/or immunotherapy agents Keytruda , Taxol , Carboplatin       To help prevent nausea and vomiting after your treatment, we encourage you to take your nausea medication as directed.  BELOW ARE SYMPTOMS THAT SHOULD BE REPORTED IMMEDIATELY: *FEVER GREATER THAN 100.4 F (38 C) OR HIGHER *CHILLS OR SWEATING *NAUSEA AND VOMITING THAT IS NOT CONTROLLED WITH YOUR NAUSEA MEDICATION *UNUSUAL SHORTNESS OF BREATH *UNUSUAL BRUISING OR BLEEDING *URINARY PROBLEMS (pain or burning when urinating, or frequent urination) *BOWEL PROBLEMS (unusual diarrhea, constipation, pain near the anus) TENDERNESS IN MOUTH AND THROAT WITH OR WITHOUT PRESENCE OF ULCERS (sore throat, sores in mouth, or a toothache) UNUSUAL RASH, SWELLING OR PAIN  UNUSUAL VAGINAL DISCHARGE OR ITCHING   Items with * indicate a potential emergency and should be followed up as soon as possible or go to the Emergency Department if any problems should occur.  Please show the CHEMOTHERAPY ALERT CARD  or IMMUNOTHERAPY ALERT CARD at check-in to the Emergency Department and triage nurse.  Should you have questions after your visit or need to cancel or reschedule your appointment, please contact [facility] - A DEPT OF [facility]  [phone] and follow the prompts.  Office hours are 8:00 a.m. to 4:30 p.m. Monday - Friday. Please note that voicemails left after 4:00 p.m. may not be returned until the following business day.  We are closed weekends and major holidays. You have access to a nurse at all times for urgent questions. Please call the main number to the clinic [phone] and follow the prompts.  For any non-urgent questions, you may also contact your provider using MyChart. We now offer e-Visits for anyone 39 and older to request care online for non-urgent symptoms. For details visit [email or web address].   Also download the MyChart app! Go to the app store, search MyChart, open the app, select [facility], and log in with your MyChart username and password.  Pembrolizumab  Injection What is this medication? PEMBROLIZUMAB  (PEM broe LIZ ue mab) treats some types of cancer. It works by helping your immune system slow or stop the spread of cancer cells. It is a monoclonal antibody. This medicine may be used for other purposes; ask your health care provider or pharmacist if you have questions. COMMON BRAND NAME(S): Keytruda  What should I tell my care team before I take this medication? They need to know if you have any of these conditions: Allogeneic stem cell transplant (uses someone else's stem cells) Autoimmune diseases, such as  Crohn disease, ulcerative colitis, lupus History of chest radiation Nervous system problems, such as Guillain-Barre syndrome, myasthenia gravis Organ transplant An unusual or allergic reaction to pembrolizumab , other medications, foods, dyes, or preservatives Pregnant or trying to get pregnant Breast-feeding How should  I use this medication? This medication is injected into a vein. It is given by your care team in a hospital or clinic setting. A special MedGuide will be given to you before each treatment. Be sure to read this information carefully each time. Talk to your care team about the use of this medication in children. While it may be prescribed for children as young as 6 months for selected conditions, precautions do apply. Overdosage: If you think you have taken too much of this medicine contact a poison control center or emergency room at once. NOTE: This medicine is only for you. Do not share this medicine with others. What if I miss a dose? Keep appointments for follow-up doses. It is important not to miss your dose. Call your care team if you are unable to keep an appointment. What may interact with this medication? Interactions have not been studied. This list may not describe all possible interactions. Give your health care provider a list of all the medicines, herbs, non-prescription drugs, or dietary supplements you use. Also tell them if you smoke, drink alcohol, or use illegal drugs. Some items may interact with your medicine. What should I watch for while using this medication? Your condition will be monitored carefully while you are receiving this medication. You may need blood work while taking this medication. This medication may cause serious skin reactions. They can happen weeks to months after starting the medication. Contact your care team right away if you notice fevers or flu-like symptoms with a rash. The rash may be red or purple and then turn into blisters or peeling of the skin. You may also notice a red rash with swelling of the face, lips, or lymph nodes in your neck or under your arms. Tell your care team right away if you have any change in your eyesight. Talk to your care team if you may be pregnant. Serious birth defects can occur if you take this medication during pregnancy  and for 4 months after the last dose. You will need a negative pregnancy test before starting this medication. Contraception is recommended while taking this medication and for 4 months after the last dose. Your care team can help you find the option that works for you. Do not breastfeed while taking this medication and for 4 months after the last dose. What side effects may I notice from receiving this medication? Side effects that you should report to your care team as soon as possible: Allergic reactions--skin rash, itching, hives, swelling of the face, lips, tongue, or throat Dry cough, shortness of breath or trouble breathing Eye pain, redness, irritation, or discharge with blurry or decreased vision Heart muscle inflammation--unusual weakness or fatigue, shortness of breath, chest pain, fast or irregular heartbeat, dizziness, swelling of the ankles, feet, or hands Hormone gland problems--headache, sensitivity to light, unusual weakness or fatigue, dizziness, fast or irregular heartbeat, increased sensitivity to cold or heat, excessive sweating, constipation, hair loss, increased thirst or amount of urine, tremors or shaking, irritability Infusion reactions--chest pain, shortness of breath or trouble breathing, feeling faint or lightheaded Kidney injury (glomerulonephritis)--decrease in the amount of urine, red or dark brown urine, foamy or bubbly urine, swelling of the ankles, hands, or feet Liver injury--right upper belly  pain, loss of appetite, nausea, light-colored stool, dark yellow or brown urine, yellowing skin or eyes, unusual weakness or fatigue Pain, tingling, or numbness in the hands or feet, muscle weakness, change in vision, confusion or trouble speaking, loss of balance or coordination, trouble walking, seizures Rash, fever, and swollen lymph nodes Redness, blistering, peeling, or loosening of the skin, including inside the mouth Sudden or severe stomach pain, bloody diarrhea,  fever, nausea, vomiting Side effects that usually do not require medical attention (report to your care team if they continue or are bothersome): Bone, joint, or muscle pain Diarrhea Fatigue Loss of appetite Nausea Skin rash This list may not describe all possible side effects. Call your doctor for medical advice about side effects. You may report side effects to FDA at 1-800-FDA-1088. Where should I keep my medication? This medication is given in a hospital or clinic. It will not be stored at home. NOTE: This sheet is a summary. It may not cover all possible information. If you have questions about this medicine, talk to your doctor, pharmacist, or health care provider.  2024 Elsevier/Gold Standard (2022-01-29 00:00:00)  Paclitaxel  Injection What is this medication? PACLITAXEL  (PAK li TAX el) treats some types of cancer. It works by slowing down the growth of cancer cells. This medicine may be used for other purposes; ask your health care provider or pharmacist if you have questions. COMMON BRAND NAME(S): Onxol, Taxol  What should I tell my care team before I take this medication? They need to know if you have any of these conditions: Heart disease Liver disease Low white blood cell levels An unusual or allergic reaction to paclitaxel , other medications, foods, dyes, or preservatives If you or your partner are pregnant or trying to get pregnant Breast-feeding How should I use this medication? This medication is injected into a vein. It is given by your care team in a hospital or clinic setting. Talk to your care team about the use of this medication in children. While it may be given to children for selected conditions, precautions do apply. Overdosage: If you think you have taken too much of this medicine contact a poison control center or emergency room at once. NOTE: This medicine is only for you. Do not share this medicine with others. What if I miss a dose? Keep appointments  for follow-up doses. It is important not to miss your dose. Call your care team if you are unable to keep an appointment. What may interact with this medication? Do not take this medication with any of the following: Live virus vaccines Other medications may affect the way this medication works. Talk with your care team about all of the medications you take. They may suggest changes to your treatment plan to lower the risk of side effects and to make sure your medications work as intended. This list may not describe all possible interactions. Give your health care provider a list of all the medicines, herbs, non-prescription drugs, or dietary supplements you use. Also tell them if you smoke, drink alcohol, or use illegal drugs. Some items may interact with your medicine. What should I watch for while using this medication? Your condition will be monitored carefully while you are receiving this medication. You may need blood work while taking this medication. This medication may make you feel generally unwell. This is not uncommon as chemotherapy can affect healthy cells as well as cancer cells. Report any side effects. Continue your course of treatment even though you feel ill unless your  care team tells you to stop. This medication can cause serious allergic reactions. To reduce the risk, your care team may give you other medications to take before receiving this one. Be sure to follow the directions from your care team. This medication may increase your risk of getting an infection. Call your care team for advice if you get a fever, chills, sore throat, or other symptoms of a cold or flu. Do not treat yourself. Try to avoid being around people who are sick. This medication may increase your risk to bruise or bleed. Call your care team if you notice any unusual bleeding. Be careful brushing or flossing your teeth or using a toothpick because you may get an infection or bleed more easily. If you have any  dental work done, tell your dentist you are receiving this medication. Talk to your care team if you may be pregnant. Serious birth defects can occur if you take this medication during pregnancy. Talk to your care team before breastfeeding. Changes to your treatment plan may be needed. What side effects may I notice from receiving this medication? Side effects that you should report to your care team as soon as possible: Allergic reactions--skin rash, itching, hives, swelling of the face, lips, tongue, or throat Heart rhythm changes--fast or irregular heartbeat, dizziness, feeling faint or lightheaded, chest pain, trouble breathing Increase in blood pressure Infection--fever, chills, cough, sore throat, wounds that don't heal, pain or trouble when passing urine, general feeling of discomfort or being unwell Low blood pressure--dizziness, feeling faint or lightheaded, blurry vision Low red blood cell level--unusual weakness or fatigue, dizziness, headache, trouble breathing Painful swelling, warmth, or redness of the skin, blisters or sores at the infusion site Pain, tingling, or numbness in the hands or feet Slow heartbeat--dizziness, feeling faint or lightheaded, confusion, trouble breathing, unusual weakness or fatigue Unusual bruising or bleeding Side effects that usually do not require medical attention (report to your care team if they continue or are bothersome): Diarrhea Hair loss Joint pain Loss of appetite Muscle pain Nausea Vomiting This list may not describe all possible side effects. Call your doctor for medical advice about side effects. You may report side effects to FDA at 1-800-FDA-1088. Where should I keep my medication? This medication is given in a hospital or clinic. It will not be stored at home. NOTE: This sheet is a summary. It may not cover all possible information. If you have questions about this medicine, talk to your doctor, pharmacist, or health care  provider.  2024 Elsevier/Gold Standard (2022-02-05 00:00:00)  Carboplatin  Injection What is this medication? CARBOPLATIN  (KAR boe pla tin) treats some types of cancer. It works by slowing down the growth of cancer cells. This medicine may be used for other purposes; ask your health care provider or pharmacist if you have questions. COMMON BRAND NAME(S): Paraplatin  What should I tell my care team before I take this medication? They need to know if you have any of these conditions: Blood disorders Hearing problems Kidney disease Recent or ongoing radiation therapy An unusual or allergic reaction to carboplatin , cisplatin, other medications, foods, dyes, or preservatives Pregnant or trying to get pregnant Breast-feeding How should I use this medication? This medication is injected into a vein. It is given by your care team in a hospital or clinic setting. Talk to your care team about the use of this medication in children. Special care may be needed. Overdosage: If you think you have taken too much of this medicine contact a  poison control center or emergency room at once. NOTE: This medicine is only for you. Do not share this medicine with others. What if I miss a dose? Keep appointments for follow-up doses. It is important not to miss your dose. Call your care team if you are unable to keep an appointment. What may interact with this medication? Medications for seizures Some antibiotics, such as amikacin, gentamicin, neomycin, streptomycin, tobramycin Vaccines This list may not describe all possible interactions. Give your health care provider a list of all the medicines, herbs, non-prescription drugs, or dietary supplements you use. Also tell them if you smoke, drink alcohol, or use illegal drugs. Some items may interact with your medicine. What should I watch for while using this medication? Your condition will be monitored carefully while you are receiving this medication. You may  need blood work while taking this medication. This medication may make you feel generally unwell. This is not uncommon, as chemotherapy can affect healthy cells as well as cancer cells. Report any side effects. Continue your course of treatment even though you feel ill unless your care team tells you to stop. In some cases, you may be given additional medications to help with side effects. Follow all directions for their use. This medication may increase your risk of getting an infection. Call your care team for advice if you get a fever, chills, sore throat, or other symptoms of a cold or flu. Do not treat yourself. Try to avoid being around people who are sick. Avoid taking medications that contain aspirin, acetaminophen , ibuprofen , naproxen, or ketoprofen unless instructed by your care team. These medications may hide a fever. Be careful brushing or flossing your teeth or using a toothpick because you may get an infection or bleed more easily. If you have any dental work done, tell your dentist you are receiving this medication. Talk to your care team if you wish to become pregnant or think you might be pregnant. This medication can cause serious birth defects. Talk to your care team about effective forms of contraception. Do not breast-feed while taking this medication. What side effects may I notice from receiving this medication? Side effects that you should report to your care team as soon as possible: Allergic reactions--skin rash, itching, hives, swelling of the face, lips, tongue, or throat Infection--fever, chills, cough, sore throat, wounds that don't heal, pain or trouble when passing urine, general feeling of discomfort or being unwell Low red blood cell level--unusual weakness or fatigue, dizziness, headache, trouble breathing Pain, tingling, or numbness in the hands or feet, muscle weakness, change in vision, confusion or trouble speaking, loss of balance or coordination, trouble  walking, seizures Unusual bruising or bleeding Side effects that usually do not require medical attention (report to your care team if they continue or are bothersome): Hair loss Nausea Unusual weakness or fatigue Vomiting This list may not describe all possible side effects. Call your doctor for medical advice about side effects. You may report side effects to FDA at 1-800-FDA-1088. Where should I keep my medication? This medication is given in a hospital or clinic. It will not be stored at home. NOTE: This sheet is a summary. It may not cover all possible information. If you have questions about this medicine, talk to your doctor, pharmacist, or health care provider.  2024 Elsevier/Gold Standard (2022-01-08 00:00:00)

## 2024-10-22 NOTE — Progress Notes (Signed)
 "    Hematology/Oncology Consult note [facility]  [phone] [phone]  Patient Care Team: Antonette Angeline ORN, NP as PCP - General (Internal Medicine) Georgina Shasta POUR, RN as Oncology Nurse Navigator Melanee Annah BROCKS, MD as Consulting Physician (Oncology)   Name of the patient: Amber Figueroa  [id number]  05-01-1961   Date of visit: 10/22/24  Diagnosis-  Cancer Staging  Malignant neoplasm of lower-outer quadrant of left breast of female, estrogen receptor negative (HCC) Staging form: Breast, AJCC 8th Edition - Clinical stage from 07/27/2024: Stage IIB (cT2, cN0, cM0, G2, ER-, PR-, HER2-) - Signed by Melanee Annah BROCKS, MD on 07/27/2024 Stage prefix: Initial diagnosis Histologic grading system: 3 grade system    Chief complaint/ Reason for visit-on treatment assessment prior to cycle 10 of weekly CarboTaxol chemotherapy and dose 4 of Keytruda   Heme/Onc history:  patient is a 64 year old female with a past medical history significant for hypertension who underwent a bilateral diagnostic mammogram on 07/19/2024 for a palpable left breast lump..  Mammogram showed a 3 cm highly suspicious mass in the left breast.  No left axillary adenopathy.  Small benign right breast cysts.  No evidence of right breast malignancy.  Patient underwent core biopsy of the left breast mass which was consistent with invasive mammary carcinoma grade 2 1.4 cm ER 0% negative, PR 0% negative, Ki-67 25% and HER2 negative +1   Patient lives with her son and grandson.  She works at [employer] and is independent of her ADLs and IADLs.  Menarche at 26.  G2 P2.  Age at first birth 43.  She used birth control pills in the past remotely.  Maternal grandmother with possible history of breast cancer.  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 MRI bilateral breast  07/27/2024 showed 3 cm biopsy-proven breast carcinoma in the inferior aspect of the left breast.  Indeterminate non-mass enhancement extending anteriorly which may represent additional malignancy.  No evidence of right breast malignancy.  No evidence of axillary adenopathy.   Plan is for neoadjuvant chemotherapy for triple negative breast cancer as per keynote 522 regimen    Interval history-tolerating treatments well so far.  She denies any complaints today.  No symptoms of nausea vomiting diarrhea or peripheral neuropathy  History of Present Illness   ECOG PS- 0 Pain scale- 0   Review of systems- Review of Systems  Constitutional:  Negative for chills, fever, malaise/fatigue and weight loss.  HENT:  Negative for congestion, ear discharge and nosebleeds.   Eyes:  Negative for blurred vision.  Respiratory:  Negative for cough, hemoptysis, sputum production, shortness of breath and wheezing.   Cardiovascular:  Negative for chest pain, palpitations, orthopnea and claudication.  Gastrointestinal:  Negative for abdominal pain, blood in stool, constipation, diarrhea, heartburn, melena, nausea and vomiting.  Genitourinary:  Negative for dysuria, flank pain, frequency, hematuria and urgency.  Musculoskeletal:  Negative for back pain, joint pain and myalgias.  Skin:  Negative for rash.  Neurological:  Negative for dizziness, tingling, focal weakness, seizures, weakness and headaches.  Endo/Heme/Allergies:  Does not bruise/bleed easily.  Psychiatric/Behavioral:  Negative for depression and suicidal ideas. The patient does not have insomnia.       Allergies[1]   Past Medical History:  Diagnosis Date   Back pain    Complication of anesthesia    slow to  wake up with the knee surgery   GERD (gastroesophageal reflux disease)    Hypertension    Malignant neoplasm of lower-outer quadrant of left breast of female, estrogen receptor negative (HCC) 06/2024     Past Surgical History:   Procedure Laterality Date   BREAST BIOPSY Left 07/21/2024   US  LT BREAST BX W LOC DEV 1ST LESION IMG BX SPEC US  GUIDE 07/21/2024 [facility]   COLONOSCOPY WITH PROPOFOL  N/A 07/21/2017   Procedure: COLONOSCOPY WITH PROPOFOL ;  Surgeon: Therisa Bi, MD;  Location: [facility] ENDOSCOPY;  Service: Gastroenterology;  Laterality: N/A;   KNEE SURGERY Left    arthroscopic   PORTACATH PLACEMENT N/A 08/03/2024   Procedure: INSERTION, TUNNELED CENTRAL VENOUS DEVICE, WITH PORT;  Surgeon: Jordis Laneta FALCON, MD;  Location: [facility] ORS;  Service: General;  Laterality: N/A;    Social History   Socioeconomic History   Marital status: Legally Separated    Spouse name: Not on file   Number of children: 2   Years of education: Not on file   Highest education level: Not on file  Occupational History   Occupation: warehouse  Tobacco Use   Smoking status: Every Day    Current packs/day: 1.00    Average packs/day: 1 pack/day for 1 year (1.0 ttl pk-yrs)    Types: Cigarettes    Passive exposure: Past   Smokeless tobacco: Never   Tobacco comments:    age [age] smoked 1/2 ppd, quit x 20 years, now smoking 1 year  Vaping Use   Vaping status: Never Used  Substance and Sexual Activity   Alcohol use: No   Drug use: No   Sexual activity: Never  Other Topics Concern   Not on file  Social History Narrative   Lives with son and grandbaby   Social Drivers of Health   Tobacco Use: High Risk (10/22/2024)   Patient History    Smoking Tobacco Use: Every Day    Smokeless Tobacco Use: Never    Passive Exposure: Past  Financial Resource Strain: Not on file  Food Insecurity: No Food Insecurity (07/27/2024)   Epic    Worried About Programme Researcher, Broadcasting/film/video in the Last Year: Never true    Ran Out of Food in the Last Year: Never true  Transportation Needs: No Transportation Needs (07/27/2024)   Epic    Lack of Transportation (Medical): No    Lack of Transportation (Non-Medical): No  Physical Activity: Not on file   Stress: Not on file  Social Connections: Not on file  Intimate Partner Violence: Not At Risk (07/27/2024)   Epic    Fear of Current or Ex-Partner: No    Emotionally Abused: No    Physically Abused: No    Sexually Abused: No  Depression (PHQ2-9): Low Risk (10/22/2024)   Depression (PHQ2-9)    PHQ-2 Score: 0  Alcohol Screen: Low Risk (09/09/2022)   Alcohol Screen    Last Alcohol Screening Score (AUDIT): 1  Housing: Low Risk (07/27/2024)   Epic    Unable to Pay for Housing in the Last Year: No    Number of Times Moved in the Last Year: 0    Homeless in the Last Year: No  Utilities: Not At Risk (07/27/2024)   Epic    Threatened with loss of utilities: No  Recent Concern: Utilities - At Risk (07/23/2024)   Epic    Threatened with loss of utilities: Yes  Health Literacy: Not on file    Family History  Problem Relation Age of  Onset   Cancer Mother    Bone cancer Father        unknown type   Hypertension Sister    Hypertension Brother    Hyperlipidemia Brother    Hypertension Brother    Diabetes Brother    Hypertension Brother    Breast cancer Maternal Grandmother        likely breast ca - mastectomy   Heart attack Neg Hx    Stroke Neg Hx    Colon cancer Neg Hx    Ovarian cancer Neg Hx     Current Medications[2]  Physical exam:  Vitals:   10/22/24 0854  BP: 102/67  Pulse: 95  Resp: 18  Temp: (!) 97 F (36.1 C)  TempSrc: Tympanic  SpO2: 100%  Weight: 198 lb (89.8 kg)  Height: 5' 11 (1.803 m)   Physical Exam Cardiovascular:     Rate and Rhythm: Normal rate and regular rhythm.     Heart sounds: Normal heart sounds.  Pulmonary:     Effort: Pulmonary effort is normal.     Breath sounds: Normal breath sounds.  Skin:    General: Skin is warm and dry.  Neurological:     Mental Status: She is alert and oriented to person, place, and time.      I have personally reviewed labs listed below:    Latest Ref Rng & Units 10/15/2024    8:34 AM  CMP  Glucose 70  - 99 mg/dL 91   BUN 8 - 23 mg/dL 12   Creatinine 9.55 - 1.00 mg/dL 9.16   Sodium 864 - 854 mmol/L 142   Potassium 3.5 - 5.1 mmol/L 4.2   Chloride 98 - 111 mmol/L 109   CO2 22 - 32 mmol/L 25   Calcium 8.9 - 10.3 mg/dL 9.2   Total Protein 6.5 - 8.1 g/dL 6.5   Total Bilirubin 0.0 - 1.2 mg/dL 0.4   Alkaline Phos 38 - 126 U/L 70   AST 15 - 41 U/L 17   ALT 0 - 44 U/L 16       Latest Ref Rng & Units 10/22/2024    8:45 AM  CBC  WBC 4.0 - 10.5 K/uL 4.2   Hemoglobin 12.0 - 15.0 g/dL 88.9   Hematocrit 63.9 - 46.0 % 33.6   Platelets 150 - 400 K/uL 234       Assessment and plan- Patient is a 64 y.o. 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  She is here for on treatment assessment prior to cycle 10 of weekly CarboTaxol chemotherapy  Counts okay to proceed with cycle 10 of weekly CarboTaxol chemotherapy today.  She will also receive dose 4 of Keytruda .  So far she is tolerating chemotherapy very well without any significant side effects.  Clinically her left breast mass is shrinking.  Plan is to repeat interim ultrasound after 12 weekly cycles of CarboTaxol chemotherapy for proceeding with second half of treatment which would be AC Keytruda . Assessment & Plan      Visit Diagnosis 1. Encounter for antineoplastic chemotherapy   2. Malignant neoplasm of lower-outer quadrant of left breast of female, estrogen receptor negative (HCC)      Dr. Annah Skene, MD, MPH [facility] [phone] 10/22/2024 9:22 AM                   [1] No Known Allergies [2]  Current Outpatient Medications:    albuterol  (  VENTOLIN  HFA) 108 (90 Base) MCG/ACT inhaler, Inhale into the lungs every 6 (six) hours as needed for wheezing or shortness of breath., Disp: , Rfl:    amLODipine  (NORVASC ) 10 MG tablet, TAKE 1 TABLET BY MOUTH EVERY DAY, Disp: 90 tablet, Rfl: 0   aspirin-sod bicarb-citric acid (ALKA-SELTZER) 325 MG TBEF tablet, Take 325  mg by mouth every 6 (six) hours as needed., Disp: , Rfl:    dexamethasone  (DECADRON ) 4 MG tablet, Take 2 tablets daily for 2 days, start the day after chemotherapy. Take with food. (Patient taking differently: as needed. Take 2 tablets daily for 2 days, start the day after chemotherapy. Take with food.), Disp: 30 tablet, Rfl: 1   HYDROcodone -acetaminophen  (NORCO/VICODIN) 5-325 MG tablet, Take 1 tablet by mouth every 6 (six) hours as needed for moderate pain (pain score 4-6)., Disp: 12 tablet, Rfl: 0   lidocaine -prilocaine  (EMLA ) cream, Apply to affected area once, Disp: 30 g, Rfl: 3   ondansetron  (ZOFRAN ) 8 MG tablet, Take 1 tablet (8 mg total) by mouth every 8 (eight) hours as needed for nausea or vomiting. Start on the third day after chemotherapy., Disp: 30 tablet, Rfl: 1   prochlorperazine  (COMPAZINE ) 10 MG tablet, Take 1 tablet (10 mg total) by mouth every 6 (six) hours as needed for nausea or vomiting., Disp: 30 tablet, Rfl: 1   traZODone  (DESYREL ) 50 MG tablet, TAKE 1 TABLET BY MOUTH EVERYDAY AT BEDTIME (Patient not taking: Reported on 10/15/2024), Disp: 90 tablet, Rfl: 1  "

## 2024-10-22 NOTE — Progress Notes (Signed)
 Patient doing good; no new or acute concerns at this time.

## 2024-10-22 NOTE — Telephone Encounter (Signed)
 Requested Prescriptions  Pending Prescriptions Disp Refills   amLODipine  (NORVASC ) 10 MG tablet [Pharmacy Med Name: AMLODIPINE  BESYLATE 10 MG TAB] 90 tablet 1    Sig: TAKE 1 TABLET BY MOUTH EVERY DAY     Cardiovascular: Calcium Channel Blockers 2 Passed - 10/22/2024 11:26 AM      Passed - Last BP in normal range    BP Readings from Last 1 Encounters:  10/22/24 102/67         Passed - Last Heart Rate in normal range    Pulse Readings from Last 1 Encounters:  10/22/24 95         Passed - Valid encounter within last 6 months    Recent Outpatient Visits           2 months ago Primary hypertension   [facility] [place], Angeline ORN, NP   3 months ago Mass overlapping multiple quadrants of left breast   [place] [facility] [place], Angeline ORN, NP   6 months ago Cellulitis of right lower extremity   [place] [facility] [place], Angeline ORN, NP   8 months ago Encounter for general adult medical examination with abnormal findings   [place] [facility] [place], Angeline ORN, NP

## 2024-10-29 ENCOUNTER — Inpatient Hospital Stay

## 2024-10-29 ENCOUNTER — Inpatient Hospital Stay: Admitting: Oncology

## 2024-10-29 VITALS — BP 117/66 | HR 88 | Temp 96.1°F | Resp 16 | Ht 71.0 in | Wt 197.5 lb

## 2024-10-29 DIAGNOSIS — Z5112 Encounter for antineoplastic immunotherapy: Secondary | ICD-10-CM | POA: Diagnosis not present

## 2024-10-29 DIAGNOSIS — C50512 Malignant neoplasm of lower-outer quadrant of left female breast: Secondary | ICD-10-CM

## 2024-10-29 LAB — CMP (CANCER CENTER ONLY)
ALT: 20 U/L (ref 0–44)
AST: 21 U/L (ref 15–41)
Albumin: 4 g/dL (ref 3.5–5.0)
Alkaline Phosphatase: 69 U/L (ref 38–126)
Anion gap: 11 (ref 5–15)
BUN: 10 mg/dL (ref 8–23)
CO2: 26 mmol/L (ref 22–32)
Calcium: 9.2 mg/dL (ref 8.9–10.3)
Chloride: 104 mmol/L (ref 98–111)
Creatinine: 0.67 mg/dL (ref 0.44–1.00)
GFR, Estimated: >60 mL/min
Glucose, Bld: 116 mg/dL — ABNORMAL HIGH (ref 70–99)
Potassium: 3.9 mmol/L (ref 3.5–5.1)
Sodium: 140 mmol/L (ref 135–145)
Total Bilirubin: 0.4 mg/dL (ref 0.0–1.2)
Total Protein: 6.6 g/dL (ref 6.5–8.1)

## 2024-10-29 LAB — CBC WITH DIFFERENTIAL (CANCER CENTER ONLY)
Abs Immature Granulocytes: 0.04 10*3/uL (ref 0.00–0.07)
Basophils Absolute: 0 10*3/uL (ref 0.0–0.1)
Basophils Relative: 1 %
Eosinophils Absolute: 0.1 10*3/uL (ref 0.0–0.5)
Eosinophils Relative: 2 %
HCT: 35.8 % — ABNORMAL LOW (ref 36.0–46.0)
Hemoglobin: 11.9 g/dL — ABNORMAL LOW (ref 12.0–15.0)
Immature Granulocytes: 1 %
Lymphocytes Relative: 42 %
Lymphs Abs: 2.1 10*3/uL (ref 0.7–4.0)
MCH: 30.6 pg (ref 26.0–34.0)
MCHC: 33.2 g/dL (ref 30.0–36.0)
MCV: 92 fL (ref 80.0–100.0)
Monocytes Absolute: 0.3 10*3/uL (ref 0.1–1.0)
Monocytes Relative: 7 %
Neutro Abs: 2.4 10*3/uL (ref 1.7–7.7)
Neutrophils Relative %: 47 %
Platelet Count: 286 10*3/uL (ref 150–400)
RBC: 3.89 MIL/uL (ref 3.87–5.11)
RDW: 17.9 % — ABNORMAL HIGH (ref 11.5–15.5)
WBC Count: 5 10*3/uL (ref 4.0–10.5)
nRBC: 0 % (ref 0.0–0.2)

## 2024-10-29 MED ORDER — SODIUM CHLORIDE 0.9 % IV SOLN
60.0000 mg/m2 | Freq: Once | INTRAVENOUS | Status: AC
  Administered 2024-10-29: 120 mg via INTRAVENOUS
  Filled 2024-10-29: qty 20

## 2024-10-29 MED ORDER — DEXAMETHASONE SOD PHOSPHATE PF 10 MG/ML IJ SOLN
10.0000 mg | Freq: Once | INTRAMUSCULAR | Status: AC
  Administered 2024-10-29: 10 mg via INTRAVENOUS
  Filled 2024-10-29: qty 1

## 2024-10-29 MED ORDER — SODIUM CHLORIDE 0.9 % IV SOLN
177.6000 mg | Freq: Once | INTRAVENOUS | Status: AC
  Administered 2024-10-29: 180 mg via INTRAVENOUS
  Filled 2024-10-29: qty 18

## 2024-10-29 MED ORDER — PALONOSETRON HCL INJECTION 0.25 MG/5ML
0.2500 mg | Freq: Once | INTRAVENOUS | Status: AC
  Administered 2024-10-29: 0.25 mg via INTRAVENOUS
  Filled 2024-10-29: qty 5

## 2024-10-29 MED ORDER — DIPHENHYDRAMINE HCL 50 MG/ML IJ SOLN
50.0000 mg | Freq: Once | INTRAMUSCULAR | Status: AC
  Administered 2024-10-29: 50 mg via INTRAVENOUS
  Filled 2024-10-29: qty 1

## 2024-10-29 MED ORDER — FAMOTIDINE IN NACL 20-0.9 MG/50ML-% IV SOLN
20.0000 mg | Freq: Once | INTRAVENOUS | Status: AC
  Administered 2024-10-29: 20 mg via INTRAVENOUS
  Filled 2024-10-29: qty 50

## 2024-10-29 MED ORDER — SODIUM CHLORIDE 0.9 % IV SOLN
INTRAVENOUS | Status: DC
  Filled 2024-10-29: qty 250

## 2024-10-29 NOTE — Patient Instructions (Signed)
 [facility] - A DEPT OF [facility]  Discharge Instructions: Thank you for choosing [facility] to provide your oncology and hematology care.  If you have a lab appointment with the Cancer Center, please go directly to the Cancer Center and check in at the registration area.  Wear comfortable clothing and clothing appropriate for easy access to any Portacath or PICC line.   We strive to give you quality time with your provider. You may need to reschedule your appointment if you arrive late (15 or more minutes).  Arriving late affects you and other patients whose appointments are after yours.  Also, if you miss three or more appointments without notifying the office, you may be dismissed from the clinic at the providers discretion.      For prescription refill requests, have your pharmacy contact our office and allow 72 hours for refills to be completed.    Today you received the following chemotherapy and/or immunotherapy agents Taxol , Carboplatin       To help prevent nausea and vomiting after your treatment, we encourage you to take your nausea medication as directed.  BELOW ARE SYMPTOMS THAT SHOULD BE REPORTED IMMEDIATELY: *FEVER GREATER THAN 100.4 F (38 C) OR HIGHER *CHILLS OR SWEATING *NAUSEA AND VOMITING THAT IS NOT CONTROLLED WITH YOUR NAUSEA MEDICATION *UNUSUAL SHORTNESS OF BREATH *UNUSUAL BRUISING OR BLEEDING *URINARY PROBLEMS (pain or burning when urinating, or frequent urination) *BOWEL PROBLEMS (unusual diarrhea, constipation, pain near the anus) TENDERNESS IN MOUTH AND THROAT WITH OR WITHOUT PRESENCE OF ULCERS (sore throat, sores in mouth, or a toothache) UNUSUAL RASH, SWELLING OR PAIN  UNUSUAL VAGINAL DISCHARGE OR ITCHING   Items with * indicate a potential emergency and should be followed up as soon as possible or go to the Emergency Department if any problems should occur.  Please show the CHEMOTHERAPY ALERT CARD or  IMMUNOTHERAPY ALERT CARD at check-in to the Emergency Department and triage nurse.  Should you have questions after your visit or need to cancel or reschedule your appointment, please contact [facility] - A DEPT OF [facility]  [phone] and follow the prompts.  Office hours are 8:00 a.m. to 4:30 p.m. Monday - Friday. Please note that voicemails left after 4:00 p.m. may not be returned until the following business day.  We are closed weekends and major holidays. You have access to a nurse at all times for urgent questions. Please call the main number to the clinic [phone] and follow the prompts.  For any non-urgent questions, you may also contact your provider using MyChart. We now offer e-Visits for anyone 87 and older to request care online for non-urgent symptoms. For details visit [email or web address].   Also download the MyChart app! Go to the app store, search MyChart, open the app, select [place], and log in with your MyChart username and password.  Paclitaxel  Injection What is this medication? PACLITAXEL  (PAK li TAX el) treats some types of cancer. It works by slowing down the growth of cancer cells. This medicine may be used for other purposes; ask your health care provider or pharmacist if you have questions. COMMON BRAND NAME(S): Onxol, Taxol  What should I tell my care team before I take this medication? They need to know if you have any of these conditions: Heart disease Liver disease Low white blood cell levels An unusual or allergic reaction to paclitaxel , other medications, foods, dyes, or preservatives If you  or your partner are pregnant or trying to get pregnant Breast-feeding How should I use this medication? This medication is infused into a vein. It is given by your care team in a hospital or clinic setting. Talk to your care team about the use of this medication in children. Special care may be needed. Overdosage: If you  think you have taken too much of this medicine contact a poison control center or emergency room at once. NOTE: This medicine is only for you. Do not share this medicine with others. What if I miss a dose? Keep appointments for follow-up doses. It is important not to miss your dose. Call your care team if you are unable to keep an appointment. What may interact with this medication? Do not take this medication with any of the following: Live virus vaccines Other medications may affect the way this medication works. Talk with your care team about all of the medications you take. They may suggest changes to your treatment plan to lower the risk of side effects and to make sure your medications work as intended. This list may not describe all possible interactions. Give your health care provider a list of all the medicines, herbs, non-prescription drugs, or dietary supplements you use. Also tell them if you smoke, drink alcohol, or use illegal drugs. Some items may interact with your medicine. What should I watch for while using this medication? Your condition will be monitored carefully while you are receiving this medication. You may need blood work while taking this medication. This medication may make you feel generally unwell. This is not uncommon as chemotherapy can affect healthy cells as well as cancer cells. Report any side effects. Continue your course of treatment even though you feel ill unless your care team tells you to stop. This medication can cause serious allergic reactions. To reduce the risk, your care team may give you other medications to take before receiving this one. Be sure to follow the directions from your care team. This medication may increase your risk of getting an infection. Call your care team for advice if you get a fever, chills, sore throat, or other symptoms of a cold or flu. Do not treat yourself. Try to avoid being around people who are sick. This medication may  increase your risk to bruise or bleed. Call your care team if you notice any unusual bleeding. Be careful brushing or flossing your teeth or using a toothpick because you may get an infection or bleed more easily. If you have any dental work done, tell your dentist you are receiving this medication. Talk to your care team if you may be pregnant. Serious birth defects can occur if you take this medication during pregnancy. Talk to your care team before breastfeeding. Changes to your treatment plan may be needed. What side effects may I notice from receiving this medication? Side effects that you should report to your care team as soon as possible: Allergic reactions--skin rash, itching, hives, swelling of the face, lips, tongue, or throat Heart rhythm changes--fast or irregular heartbeat, dizziness, feeling faint or lightheaded, chest pain, trouble breathing Increase in blood pressure Infection--fever, chills, cough, sore throat, wounds that don't heal, pain or trouble when passing urine, general feeling of discomfort or being unwell Low blood pressure--dizziness, feeling faint or lightheaded, blurry vision Low red blood cell level--unusual weakness or fatigue, dizziness, headache, trouble breathing Painful swelling, warmth, or redness of the skin, blisters or sores at the infusion site Pain, tingling, or numbness  in the hands or feet Slow heartbeat--dizziness, feeling faint or lightheaded, confusion, trouble breathing, unusual weakness or fatigue Unusual bruising or bleeding Side effects that usually do not require medical attention (report to your care team if they continue or are bothersome): Diarrhea Hair loss Joint pain Loss of appetite Muscle pain Nausea Vomiting This list may not describe all possible side effects. Call your doctor for medical advice about side effects. You may report side effects to FDA at 1-800-FDA-1088. Where should I keep my medication? This medication is given in  a hospital or clinic. It will not be stored at home. NOTE: This sheet is a summary. It may not cover all possible information. If you have questions about this medicine, talk to your doctor, pharmacist, or health care provider.  2025 Elsevier/Gold Standard (2024-07-22 00:00:00)  Carboplatin  Injection What is this medication? CARBOPLATIN  (KAR boe pla tin) treats some types of cancer. It works by slowing down the growth of cancer cells. This medicine may be used for other purposes; ask your health care provider or pharmacist if you have questions. COMMON BRAND NAME(S): Paraplatin  What should I tell my care team before I take this medication? They need to know if you have any of these conditions: Blood disorders Hearing problems Kidney disease Recent or ongoing radiation therapy An unusual or allergic reaction to carboplatin , cisplatin, other medications, foods, dyes, or preservatives Pregnant or trying to get pregnant Breast-feeding How should I use this medication? This medication is injected into a vein. It is given by your care team in a hospital or clinic setting. Talk to your care team about the use of this medication in children. Special care may be needed. Overdosage: If you think you have taken too much of this medicine contact a poison control center or emergency room at once. NOTE: This medicine is only for you. Do not share this medicine with others. What if I miss a dose? Keep appointments for follow-up doses. It is important not to miss your dose. Call your care team if you are unable to keep an appointment. What may interact with this medication? Medications for seizures Some antibiotics, such as amikacin, gentamicin, neomycin, streptomycin, tobramycin Vaccines This list may not describe all possible interactions. Give your health care provider a list of all the medicines, herbs, non-prescription drugs, or dietary supplements you use. Also tell them if you smoke, drink  alcohol, or use illegal drugs. Some items may interact with your medicine. What should I watch for while using this medication? Your condition will be monitored carefully while you are receiving this medication. You may need blood work while taking this medication. This medication may make you feel generally unwell. This is not uncommon, as chemotherapy can affect healthy cells as well as cancer cells. Report any side effects. Continue your course of treatment even though you feel ill unless your care team tells you to stop. In some cases, you may be given additional medications to help with side effects. Follow all directions for their use. This medication may increase your risk of getting an infection. Call your care team for advice if you get a fever, chills, sore throat, or other symptoms of a cold or flu. Do not treat yourself. Try to avoid being around people who are sick. Avoid taking medications that contain aspirin, acetaminophen , ibuprofen , naproxen, or ketoprofen unless instructed by your care team. These medications may hide a fever. Be careful brushing or flossing your teeth or using a toothpick because you may get an  infection or bleed more easily. If you have any dental work done, tell your dentist you are receiving this medication. Talk to your care team if you wish to become pregnant or think you might be pregnant. This medication can cause serious birth defects. Talk to your care team about effective forms of contraception. Do not breast-feed while taking this medication. What side effects may I notice from receiving this medication? Side effects that you should report to your care team as soon as possible: Allergic reactions--skin rash, itching, hives, swelling of the face, lips, tongue, or throat Infection--fever, chills, cough, sore throat, wounds that don't heal, pain or trouble when passing urine, general feeling of discomfort or being unwell Low red blood cell level--unusual  weakness or fatigue, dizziness, headache, trouble breathing Pain, tingling, or numbness in the hands or feet, muscle weakness, change in vision, confusion or trouble speaking, loss of balance or coordination, trouble walking, seizures Unusual bruising or bleeding Side effects that usually do not require medical attention (report to your care team if they continue or are bothersome): Hair loss Nausea Unusual weakness or fatigue Vomiting This list may not describe all possible side effects. Call your doctor for medical advice about side effects. You may report side effects to FDA at 1-800-FDA-1088. Where should I keep my medication? This medication is given in a hospital or clinic. It will not be stored at home. NOTE: This sheet is a summary. It may not cover all possible information. If you have questions about this medicine, talk to your doctor, pharmacist, or health care provider.  2024 Elsevier/Gold Standard (2022-01-08 00:00:00)

## 2024-11-05 ENCOUNTER — Inpatient Hospital Stay: Attending: Oncology

## 2024-11-05 ENCOUNTER — Encounter: Payer: Self-pay | Admitting: Oncology

## 2024-11-05 ENCOUNTER — Inpatient Hospital Stay: Attending: Oncology | Admitting: Oncology

## 2024-11-05 ENCOUNTER — Inpatient Hospital Stay

## 2024-11-05 VITALS — BP 122/70 | HR 92 | Temp 97.8°F | Resp 18 | Ht 71.0 in | Wt 201.9 lb

## 2024-11-05 DIAGNOSIS — Z171 Estrogen receptor negative status [ER-]: Secondary | ICD-10-CM

## 2024-11-05 DIAGNOSIS — Z5111 Encounter for antineoplastic chemotherapy: Secondary | ICD-10-CM

## 2024-11-05 LAB — CBC WITH DIFFERENTIAL (CANCER CENTER ONLY)
Abs Immature Granulocytes: 0.04 10*3/uL (ref 0.00–0.07)
Basophils Absolute: 0 10*3/uL (ref 0.0–0.1)
Basophils Relative: 1 %
Eosinophils Absolute: 0.1 10*3/uL (ref 0.0–0.5)
Eosinophils Relative: 2 %
HCT: 34.8 % — ABNORMAL LOW (ref 36.0–46.0)
Hemoglobin: 11.3 g/dL — ABNORMAL LOW (ref 12.0–15.0)
Immature Granulocytes: 1 %
Lymphocytes Relative: 35 %
Lymphs Abs: 1.7 10*3/uL (ref 0.7–4.0)
MCH: 30 pg (ref 26.0–34.0)
MCHC: 32.5 g/dL (ref 30.0–36.0)
MCV: 92.3 fL (ref 80.0–100.0)
Monocytes Absolute: 0.3 10*3/uL (ref 0.1–1.0)
Monocytes Relative: 6 %
Neutro Abs: 2.7 10*3/uL (ref 1.7–7.7)
Neutrophils Relative %: 55 %
Platelet Count: 238 10*3/uL (ref 150–400)
RBC: 3.77 MIL/uL — ABNORMAL LOW (ref 3.87–5.11)
RDW: 17.9 % — ABNORMAL HIGH (ref 11.5–15.5)
WBC Count: 4.8 10*3/uL (ref 4.0–10.5)
nRBC: 0 % (ref 0.0–0.2)

## 2024-11-05 LAB — CMP (CANCER CENTER ONLY)
ALT: 21 U/L (ref 0–44)
AST: 19 U/L (ref 15–41)
Albumin: 4 g/dL (ref 3.5–5.0)
Alkaline Phosphatase: 63 U/L (ref 38–126)
Anion gap: 11 (ref 5–15)
BUN: 12 mg/dL (ref 8–23)
CO2: 25 mmol/L (ref 22–32)
Calcium: 9 mg/dL (ref 8.9–10.3)
Chloride: 107 mmol/L (ref 98–111)
Creatinine: 0.67 mg/dL (ref 0.44–1.00)
GFR, Estimated: >60 mL/min
Glucose, Bld: 120 mg/dL — ABNORMAL HIGH (ref 70–99)
Potassium: 3.9 mmol/L (ref 3.5–5.1)
Sodium: 143 mmol/L (ref 135–145)
Total Bilirubin: 0.4 mg/dL (ref 0.0–1.2)
Total Protein: 6.2 g/dL — ABNORMAL LOW (ref 6.5–8.1)

## 2024-11-05 MED ORDER — SODIUM CHLORIDE 0.9 % IV SOLN
60.0000 mg/m2 | Freq: Once | INTRAVENOUS | Status: AC
  Administered 2024-11-05: 120 mg via INTRAVENOUS
  Filled 2024-11-05: qty 20

## 2024-11-05 MED ORDER — DIPHENHYDRAMINE HCL 50 MG/ML IJ SOLN
50.0000 mg | Freq: Once | INTRAMUSCULAR | Status: AC
  Administered 2024-11-05: 50 mg via INTRAVENOUS
  Filled 2024-11-05: qty 1

## 2024-11-05 MED ORDER — DEXAMETHASONE SOD PHOSPHATE PF 10 MG/ML IJ SOLN
10.0000 mg | Freq: Once | INTRAMUSCULAR | Status: AC
  Administered 2024-11-05: 10 mg via INTRAVENOUS
  Filled 2024-11-05: qty 1

## 2024-11-05 MED ORDER — PALONOSETRON HCL INJECTION 0.25 MG/5ML
0.2500 mg | Freq: Once | INTRAVENOUS | Status: AC
  Administered 2024-11-05: 0.25 mg via INTRAVENOUS
  Filled 2024-11-05: qty 5

## 2024-11-05 MED ORDER — FAMOTIDINE IN NACL 20-0.9 MG/50ML-% IV SOLN
20.0000 mg | Freq: Once | INTRAVENOUS | Status: AC
  Administered 2024-11-05: 20 mg via INTRAVENOUS
  Filled 2024-11-05: qty 50

## 2024-11-05 MED ORDER — SODIUM CHLORIDE 0.9 % IV SOLN
INTRAVENOUS | Status: DC
  Filled 2024-11-05: qty 250

## 2024-11-05 MED ORDER — SODIUM CHLORIDE 0.9 % IV SOLN
177.6000 mg | Freq: Once | INTRAVENOUS | Status: AC
  Administered 2024-11-05: 180 mg via INTRAVENOUS
  Filled 2024-11-05: qty 18

## 2024-11-05 NOTE — Patient Instructions (Signed)
 [facility] - A DEPT OF [facility]  Discharge Instructions: Thank you for choosing [facility] to provide your oncology and hematology care.  If you have a lab appointment with the Cancer Center, please go directly to the Cancer Center and check in at the registration area.  Wear comfortable clothing and clothing appropriate for easy access to any Portacath or PICC line.   We strive to give you quality time with your provider. You may need to reschedule your appointment if you arrive late (15 or more minutes).  Arriving late affects you and other patients whose appointments are after yours.  Also, if you miss three or more appointments without notifying the office, you may be dismissed from the clinic at the providers discretion.      For prescription refill requests, have your pharmacy contact our office and allow 72 hours for refills to be completed.    Today you received the following chemotherapy and/or immunotherapy agents: Carbo/taxol    To help prevent nausea and vomiting after your treatment, we encourage you to take your nausea medication as directed.  BELOW ARE SYMPTOMS THAT SHOULD BE REPORTED IMMEDIATELY: *FEVER GREATER THAN 100.4 F (38 C) OR HIGHER *CHILLS OR SWEATING *NAUSEA AND VOMITING THAT IS NOT CONTROLLED WITH YOUR NAUSEA MEDICATION *UNUSUAL SHORTNESS OF BREATH *UNUSUAL BRUISING OR BLEEDING *URINARY PROBLEMS (pain or burning when urinating, or frequent urination) *BOWEL PROBLEMS (unusual diarrhea, constipation, pain near the anus) TENDERNESS IN MOUTH AND THROAT WITH OR WITHOUT PRESENCE OF ULCERS (sore throat, sores in mouth, or a toothache) UNUSUAL RASH, SWELLING OR PAIN  UNUSUAL VAGINAL DISCHARGE OR ITCHING   Items with * indicate a potential emergency and should be followed up as soon as possible or go to the Emergency Department if any problems should occur.  Please show the CHEMOTHERAPY ALERT CARD or IMMUNOTHERAPY  ALERT CARD at check-in to the Emergency Department and triage nurse.  Should you have questions after your visit or need to cancel or reschedule your appointment, please contact [facility] A [facility]PT OF [facility]  [phone] and follow the prompts.  Office hours are 8:00 a.m. to 4:30 p.m. Monday - Friday. Please note that voicemails left after 4:00 p.m. may not be returned until the following business day.  We are closed weekends and major holidays. You have access to a nurse at all times for urgent questions. Please call the main number to the clinic [phone] and follow the prompts.  For any non-urgent questions, you may also contact your provider using MyChart. We now offer e-Visits for anyone 65 and older to request care online for non-urgent symptoms. For details visit [email or web address].   Also download the MyChart app! Go to the app store, search MyChart, open the app, select [place], and log in with your MyChart username and password.

## 2024-11-05 NOTE — Progress Notes (Signed)
 "    Hematology/Oncology Consult note [facility]  [phone] [phone]  Patient Care Team: Antonette Angeline ORN, NP as PCP - General (Internal Medicine) Georgina Shasta POUR, RN as Oncology Nurse Navigator Melanee Annah BROCKS, MD as Consulting Physician (Oncology)   Name of the patient: Amber Figueroa  [id number]  Nov 14, 1960   Date of visit: 11/05/24  Diagnosis-  Cancer Staging  Malignant neoplasm of lower-outer quadrant of left breast of female, estrogen receptor negative (HCC) Staging form: Breast, AJCC 8th Edition - Clinical stage from 07/27/2024: Stage IIB (cT2, cN0, cM0, G2, ER-, PR-, HER2-) - Signed by Melanee Annah BROCKS, MD on 07/27/2024 Stage prefix: Initial diagnosis Histologic grading system: 3 grade system    Chief complaint/ Reason for visit-on treatment assessment prior to cycle 12 of weekly CarboTaxol chemotherapy as per keynote 522 trial  Heme/Onc history:  patient is a 64 year old female with a past medical history significant for hypertension who underwent a bilateral diagnostic mammogram on 07/19/2024 for a palpable left breast lump..  Mammogram showed a 3 cm highly suspicious mass in the left breast.  No left axillary adenopathy.  Small benign right breast cysts.  No evidence of right breast malignancy.  Patient underwent core biopsy of the left breast mass which was consistent with invasive mammary carcinoma grade 2 1.4 cm ER 0% negative, PR 0% negative, Ki-67 25% and HER2 negative +1   Patient lives with her son and grandson.  She works at [employer] and is independent of her ADLs and IADLs.  Menarche at 5.  G2 P2.  Age at first birth 41.  She used birth control pills in the past remotely.  Maternal grandmother with possible history of breast cancer.  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 MRI bilateral breast  07/27/2024 showed 3 cm biopsy-proven breast carcinoma in the inferior aspect of the left breast.  Indeterminate non-mass enhancement extending anteriorly which may represent additional malignancy.  No evidence of right breast malignancy.  No evidence of axillary adenopathy.   Plan is for neoadjuvant chemotherapy for triple negative breast cancer as per keynote 522 regimen  Interval history- Discussed the use of AI scribe software for clinical note transcription with the patient, who gave verbal consent to proceed.  History of Present Illness   Amber Figueroa is a 64 year old female with triple negative breast cancer undergoing neoadjuvant chemotherapy who presents for follow-up after completing the first half of her treatment regimen.  She notes that the left breast mass is slightly smaller and less firm compared to before treatment. She continues to work and reports feeling well overall.  A cardiac scan was performed in October.      ECOG PS- 0 Pain scale- 0   Review of systems- Review of Systems  Constitutional:  Negative for chills, fever, malaise/fatigue and weight loss.  HENT:  Negative for congestion, ear discharge and nosebleeds.   Eyes:  Negative for blurred vision.  Respiratory:  Negative for cough, hemoptysis, sputum production, shortness of breath and wheezing.   Cardiovascular:  Negative for chest pain, palpitations, orthopnea and claudication.  Gastrointestinal:  Negative for abdominal pain, blood in stool, constipation, diarrhea, heartburn, melena, nausea and vomiting.  Genitourinary:  Negative for dysuria, flank pain, frequency, hematuria and urgency.  Musculoskeletal:  Negative for back pain, joint pain and myalgias.  Skin:  Negative for rash.  Neurological:  Negative for dizziness, tingling, focal weakness, seizures, weakness and headaches.  Endo/Heme/Allergies:  Does not bruise/bleed easily.  Psychiatric/Behavioral:  Negative for depression and suicidal ideas. The  patient does not have insomnia.       Allergies[1]   Past Medical History:  Diagnosis Date   Back pain    Complication of anesthesia    slow to wake up with the knee surgery   GERD (gastroesophageal reflux disease)    Hypertension    Malignant neoplasm of lower-outer quadrant of left breast of female, estrogen receptor negative (HCC) 06/2024     Past Surgical History:  Procedure Laterality Date   BREAST BIOPSY Left 07/21/2024   US  LT BREAST BX W LOC DEV 1ST LESION IMG BX SPEC US  GUIDE 07/21/2024 [facility]   COLONOSCOPY WITH PROPOFOL  N/A 07/21/2017   Procedure: COLONOSCOPY WITH PROPOFOL ;  Surgeon: Therisa Bi, MD;  Location: [facility] ENDOSCOPY;  Service: Gastroenterology;  Laterality: N/A;   KNEE SURGERY Left    arthroscopic   PORTACATH PLACEMENT N/A 08/03/2024   Procedure: INSERTION, TUNNELED CENTRAL VENOUS DEVICE, WITH PORT;  Surgeon: Jordis Laneta FALCON, MD;  Location: [facility] ORS;  Service: General;  Laterality: N/A;    Social History   Socioeconomic History   Marital status: Legally Separated    Spouse name: Not on file   Number of children: 2   Years of education: Not on file   Highest education level: Not on file  Occupational History   Occupation: warehouse  Tobacco Use   Smoking status: Every Day    Current packs/day: 1.00    Average packs/day: 1 pack/day for 1 year (1.0 ttl pk-yrs)    Types: Cigarettes    Passive exposure: Past   Smokeless tobacco: Never   Tobacco comments:    age [age] smoked 1/2 ppd, quit x 20 years, now smoking 1 year  Vaping Use   Vaping status: Never Used  Substance and Sexual Activity   Alcohol use: No   Drug use: No   Sexual activity: Never  Other Topics Concern   Not on file  Social History Narrative   Lives with son and grandbaby   Social Drivers of Health   Tobacco Use: High Risk (11/05/2024)   Patient History    Smoking Tobacco Use: Every Day    Smokeless Tobacco Use: Never    Passive Exposure: Past  Financial Resource  Strain: Not on file  Food Insecurity: No Food Insecurity (07/27/2024)   Epic    Worried About Programme Researcher, Broadcasting/film/video in the Last Year: Never true    Ran Out of Food in the Last Year: Never true  Transportation Needs: No Transportation Needs (07/27/2024)   Epic    Lack of Transportation (Medical): No    Lack of Transportation (Non-Medical): No  Physical Activity: Not on file  Stress: Not on file  Social Connections: Not on file  Intimate Partner Violence: Not At Risk (07/27/2024)   Epic    Fear of Current or Ex-Partner: No    Emotionally Abused: No    Physically Abused: No    Sexually Abused: No  Depression (PHQ2-9): Low Risk (11/05/2024)   Depression (PHQ2-9)    PHQ-2 Score: 0  Alcohol Screen: Low Risk (09/09/2022)   Alcohol Screen    Last Alcohol Screening Score (AUDIT): 1  Housing: Low Risk (07/27/2024)   Epic    Unable to Pay for Housing in the Last Year: No    Number of Times Moved in the Last Year:  0    Homeless in the Last Year: No  Utilities: Not At Risk (07/27/2024)   Epic    Threatened with loss of utilities: No  Recent Concern: Utilities - At Risk (07/23/2024)   Epic    Threatened with loss of utilities: Yes  Health Literacy: Not on file    Family History  Problem Relation Age of Onset   Cancer Mother    Bone cancer Father        unknown type   Hypertension Sister    Hypertension Brother    Hyperlipidemia Brother    Hypertension Brother    Diabetes Brother    Hypertension Brother    Breast cancer Maternal Grandmother        likely breast ca - mastectomy   Heart attack Neg Hx    Stroke Neg Hx    Colon cancer Neg Hx    Ovarian cancer Neg Hx     Current Medications[2]  Physical exam:  Vitals:   11/05/24 0848  BP: 122/70  Pulse: 92  Resp: 18  Temp: 97.8 F (36.6 C)  TempSrc: Tympanic  SpO2: 100%  Weight: 201 lb 14.4 oz (91.6 kg)  Height: 5' 11 (1.803 m)   Physical Exam Cardiovascular:     Rate and Rhythm: Normal rate and regular rhythm.      Heart sounds: Normal heart sounds.  Pulmonary:     Effort: Pulmonary effort is normal.     Breath sounds: Normal breath sounds.  Skin:    General: Skin is warm and dry.  Neurological:     Mental Status: She is alert and oriented to person, place, and time.   Breast exam: On today's exam the left breast mass at the 6 o'clock position appears to be about 2 cm in size  I have personally reviewed labs listed below:    Latest Ref Rng & Units 10/29/2024    9:14 AM  CMP  Glucose 70 - 99 mg/dL 883   BUN 8 - 23 mg/dL 10   Creatinine 9.55 - 1.00 mg/dL 9.32   Sodium 864 - 854 mmol/L 140   Potassium 3.5 - 5.1 mmol/L 3.9   Chloride 98 - 111 mmol/L 104   CO2 22 - 32 mmol/L 26   Calcium 8.9 - 10.3 mg/dL 9.2   Total Protein 6.5 - 8.1 g/dL 6.6   Total Bilirubin 0.0 - 1.2 mg/dL 0.4   Alkaline Phos 38 - 126 U/L 69   AST 15 - 41 U/L 21   ALT 0 - 44 U/L 20       Latest Ref Rng & Units 11/05/2024    8:29 AM  CBC  WBC 4.0 - 10.5 K/uL 4.8   Hemoglobin 12.0 - 15.0 g/dL 88.6   Hematocrit 63.9 - 46.0 % 34.8   Platelets 150 - 400 K/uL 238       Assessment and plan- Patient is a 64 y.o. female  with history of stage II triple negative left breast cancer T2 N0 M0.  She is here for on treatment assessment prior to cycle 12 of weekly CarboTaxol chemotherapy  Assessment and Plan    Triple negative breast cancer of the left lower-outer quadrant Undergoing neoadjuvant chemotherapy with favorable response; mass decreased in size and induration.  Counts otherwise.  Okay to proceed with cycle 12 of weekly CarboTaxol chemotherapy today.  She will not need Neupogen with this cycle as well.  And so far she has not required any growth factor support -She  will start doxorubicin, cyclophosphamide, and pembrolizumab  every three weeks for four cycles next week.  She did have baseline echocardiogram in October 2026 which was normal and we will use that as of her baseline.  Following completion of 4 cycles of AC  Keytruda  chemotherapy patient will proceed with surgery - Scheduled Neulasta injection on the Monday after each chemotherapy session. - Scheduled breast ultrasound on February 16 to assess treatment response.         Visit Diagnosis 1. Malignant neoplasm of lower-outer quadrant of left breast of female, estrogen receptor negative (HCC)   2. Encounter for antineoplastic chemotherapy      Dr. Annah Skene, MD, MPH [facility] [phone] 11/05/2024 9:09 AM                   [1] No Known Allergies [2]  Current Outpatient Medications:    albuterol  (VENTOLIN  HFA) 108 (90 Base) MCG/ACT inhaler, Inhale into the lungs every 6 (six) hours as needed for wheezing or shortness of breath., Disp: , Rfl:    amLODipine  (NORVASC ) 10 MG tablet, TAKE 1 TABLET BY MOUTH EVERY DAY, Disp: 90 tablet, Rfl: 1   aspirin-sod bicarb-citric acid (ALKA-SELTZER) 325 MG TBEF tablet, Take 325 mg by mouth every 6 (six) hours as needed., Disp: , Rfl:    dexamethasone  (DECADRON ) 4 MG tablet, Take 2 tablets daily for 2 days, start the day after chemotherapy. Take with food. (Patient taking differently: as needed. Take 2 tablets daily for 2 days, start the day after chemotherapy. Take with food.), Disp: 30 tablet, Rfl: 1   HYDROcodone -acetaminophen  (NORCO/VICODIN) 5-325 MG tablet, Take 1 tablet by mouth every 6 (six) hours as needed for moderate pain (pain score 4-6)., Disp: 12 tablet, Rfl: 0   lidocaine -prilocaine  (EMLA ) cream, Apply to affected area once, Disp: 30 g, Rfl: 3   ondansetron  (ZOFRAN ) 8 MG tablet, Take 1 tablet (8 mg total) by mouth every 8 (eight) hours as needed for nausea or vomiting. Start on the third day after chemotherapy., Disp: 30 tablet, Rfl: 1   prochlorperazine  (COMPAZINE ) 10 MG tablet, Take 1 tablet (10 mg total) by mouth every 6 (six) hours as needed for nausea or vomiting., Disp: 30 tablet, Rfl: 1   traZODone  (DESYREL ) 50 MG tablet, TAKE 1 TABLET BY MOUTH  EVERYDAY AT BEDTIME (Patient not taking: Reported on 10/15/2024), Disp: 90 tablet, Rfl: 1  "

## 2024-11-05 NOTE — Progress Notes (Signed)
 Patient doing good; no new or acute concerns at this time.

## 2024-11-08 ENCOUNTER — Inpatient Hospital Stay

## 2024-11-09 ENCOUNTER — Inpatient Hospital Stay

## 2024-11-10 ENCOUNTER — Inpatient Hospital Stay

## 2024-11-12 ENCOUNTER — Inpatient Hospital Stay

## 2024-11-12 ENCOUNTER — Inpatient Hospital Stay: Admitting: Oncology

## 2024-11-15 ENCOUNTER — Inpatient Hospital Stay

## 2024-11-15 ENCOUNTER — Other Ambulatory Visit

## 2024-12-03 ENCOUNTER — Inpatient Hospital Stay

## 2024-12-03 ENCOUNTER — Inpatient Hospital Stay: Admitting: Nurse Practitioner

## 2024-12-06 ENCOUNTER — Inpatient Hospital Stay

## 2024-12-13 ENCOUNTER — Other Ambulatory Visit

## 2024-12-24 ENCOUNTER — Inpatient Hospital Stay

## 2024-12-24 ENCOUNTER — Inpatient Hospital Stay: Admitting: Oncology

## 2024-12-27 ENCOUNTER — Inpatient Hospital Stay

## 2025-01-14 ENCOUNTER — Inpatient Hospital Stay

## 2025-01-14 ENCOUNTER — Inpatient Hospital Stay: Admitting: Oncology

## 2025-01-17 ENCOUNTER — Inpatient Hospital Stay

## 2025-02-07 ENCOUNTER — Encounter: Admitting: Internal Medicine
